# Patient Record
Sex: Male | Born: 1950
Health system: Southern US, Community
[De-identification: ages and names within clinical notes are randomized; demographics above are authoritative.]

## PROBLEM LIST (undated history)

## (undated) DIAGNOSIS — M51369 Other intervertebral disc degeneration, lumbar region without mention of lumbar back pain or lower extremity pain: Secondary | ICD-10-CM

## (undated) DIAGNOSIS — I10 Essential (primary) hypertension: Secondary | ICD-10-CM

## (undated) DIAGNOSIS — E782 Mixed hyperlipidemia: Secondary | ICD-10-CM

## (undated) DIAGNOSIS — I201 Angina pectoris with documented spasm: Secondary | ICD-10-CM

## (undated) DIAGNOSIS — R519 Headache, unspecified: Secondary | ICD-10-CM

## (undated) DIAGNOSIS — C3490 Malignant neoplasm of unspecified part of unspecified bronchus or lung: Secondary | ICD-10-CM

## (undated) DIAGNOSIS — R590 Localized enlarged lymph nodes: Secondary | ICD-10-CM

## (undated) DIAGNOSIS — M549 Dorsalgia, unspecified: Secondary | ICD-10-CM

## (undated) DIAGNOSIS — I2510 Atherosclerotic heart disease of native coronary artery without angina pectoris: Secondary | ICD-10-CM

## (undated) DIAGNOSIS — F419 Anxiety disorder, unspecified: Secondary | ICD-10-CM

## (undated) DIAGNOSIS — C801 Malignant (primary) neoplasm, unspecified: Secondary | ICD-10-CM

## (undated) DIAGNOSIS — N200 Calculus of kidney: Secondary | ICD-10-CM

## (undated) DIAGNOSIS — G894 Chronic pain syndrome: Secondary | ICD-10-CM

## (undated) DIAGNOSIS — G8929 Other chronic pain: Secondary | ICD-10-CM

## (undated) DIAGNOSIS — R600 Localized edema: Secondary | ICD-10-CM

## (undated) DIAGNOSIS — R7303 Prediabetes: Secondary | ICD-10-CM

## (undated) DIAGNOSIS — G43909 Migraine, unspecified, not intractable, without status migrainosus: Secondary | ICD-10-CM

## (undated) DIAGNOSIS — E785 Hyperlipidemia, unspecified: Secondary | ICD-10-CM

## (undated) DIAGNOSIS — Z77098 Contact with and (suspected) exposure to other hazardous, chiefly nonmedicinal, chemicals: Secondary | ICD-10-CM

## (undated) DIAGNOSIS — J449 Chronic obstructive pulmonary disease, unspecified: Secondary | ICD-10-CM

## (undated) DIAGNOSIS — Z87442 Personal history of urinary calculi: Secondary | ICD-10-CM

## (undated) DIAGNOSIS — E119 Type 2 diabetes mellitus without complications: Secondary | ICD-10-CM

## (undated) DIAGNOSIS — M1990 Unspecified osteoarthritis, unspecified site: Secondary | ICD-10-CM

## (undated) HISTORY — PX: TONSILLECTOMY: SUR1361

## (undated) HISTORY — PX: HEMORROIDECTOMY: SUR656

## (undated) HISTORY — PX: APPENDECTOMY: SHX54

---

## 1982-11-03 DIAGNOSIS — I2510 Atherosclerotic heart disease of native coronary artery without angina pectoris: Secondary | ICD-10-CM

## 1982-11-03 HISTORY — DX: Atherosclerotic heart disease of native coronary artery without angina pectoris: I25.10

## 1998-02-15 ENCOUNTER — Other Ambulatory Visit: Admission: RE | Admit: 1998-02-15 | Discharge: 1998-02-15 | Payer: Self-pay | Admitting: Cardiology

## 1998-07-29 ENCOUNTER — Inpatient Hospital Stay (HOSPITAL_COMMUNITY): Admission: EM | Admit: 1998-07-29 | Discharge: 1998-07-31 | Payer: Self-pay | Admitting: Emergency Medicine

## 1999-04-15 ENCOUNTER — Inpatient Hospital Stay (HOSPITAL_COMMUNITY): Admission: EM | Admit: 1999-04-15 | Discharge: 1999-04-18 | Payer: Self-pay | Admitting: Emergency Medicine

## 1999-04-15 ENCOUNTER — Encounter: Payer: Self-pay | Admitting: Cardiology

## 1999-04-17 ENCOUNTER — Encounter: Payer: Self-pay | Admitting: Cardiology

## 1999-11-15 ENCOUNTER — Ambulatory Visit (HOSPITAL_COMMUNITY): Admission: RE | Admit: 1999-11-15 | Discharge: 1999-11-15 | Payer: Self-pay | Admitting: Family Medicine

## 1999-11-15 ENCOUNTER — Encounter: Payer: Self-pay | Admitting: Family Medicine

## 1999-11-22 ENCOUNTER — Ambulatory Visit (HOSPITAL_COMMUNITY): Admission: RE | Admit: 1999-11-22 | Discharge: 1999-11-22 | Payer: Self-pay | Admitting: Family Medicine

## 1999-11-22 ENCOUNTER — Encounter: Payer: Self-pay | Admitting: Family Medicine

## 2000-07-21 ENCOUNTER — Ambulatory Visit (HOSPITAL_COMMUNITY): Admission: RE | Admit: 2000-07-21 | Discharge: 2000-07-21 | Payer: Self-pay | Admitting: Cardiology

## 2001-09-09 ENCOUNTER — Encounter: Admission: RE | Admit: 2001-09-09 | Discharge: 2001-12-08 | Payer: Self-pay

## 2001-12-30 ENCOUNTER — Encounter: Admission: RE | Admit: 2001-12-30 | Discharge: 2002-03-30 | Payer: Self-pay

## 2003-06-28 ENCOUNTER — Ambulatory Visit (HOSPITAL_COMMUNITY): Admission: RE | Admit: 2003-06-28 | Discharge: 2003-06-28 | Payer: Self-pay | Admitting: Family Medicine

## 2003-06-28 ENCOUNTER — Encounter: Payer: Self-pay | Admitting: Family Medicine

## 2005-08-07 ENCOUNTER — Ambulatory Visit (HOSPITAL_COMMUNITY): Admission: RE | Admit: 2005-08-07 | Discharge: 2005-08-07 | Payer: Self-pay | Admitting: Urology

## 2006-03-04 ENCOUNTER — Encounter: Payer: Self-pay | Admitting: Emergency Medicine

## 2006-10-11 ENCOUNTER — Emergency Department (HOSPITAL_COMMUNITY): Admission: EM | Admit: 2006-10-11 | Discharge: 2006-10-11 | Payer: Self-pay | Admitting: Emergency Medicine

## 2006-10-14 ENCOUNTER — Ambulatory Visit (HOSPITAL_BASED_OUTPATIENT_CLINIC_OR_DEPARTMENT_OTHER): Admission: RE | Admit: 2006-10-14 | Discharge: 2006-10-14 | Payer: Self-pay | Admitting: Urology

## 2006-10-19 ENCOUNTER — Ambulatory Visit (HOSPITAL_BASED_OUTPATIENT_CLINIC_OR_DEPARTMENT_OTHER): Admission: RE | Admit: 2006-10-19 | Discharge: 2006-10-19 | Payer: Self-pay | Admitting: Urology

## 2009-12-13 ENCOUNTER — Encounter: Admission: RE | Admit: 2009-12-13 | Discharge: 2009-12-13 | Payer: Self-pay | Admitting: Cardiology

## 2011-03-21 NOTE — Op Note (Signed)
NAME:  Francisco Austin, Francisco Austin NO.:  [id number]   MEDICAL RECORD NO.:  [id number]          PATIENT TYPE:  AMB   LOCATION:  [facility]                         FACILITY:  [facility]   PHYSICIAN:  Maretta Bees. Vonita Moss, M.D.DATE OF BIRTH:  03/21/1951   DATE OF PROCEDURE:  10/14/2006  DATE OF DISCHARGE:                               OPERATIVE REPORT   PREOPERATIVE DIAGNOSIS:  Right ureteral stone at level of S1.   POSTOPERATIVE DIAGNOSIS:  Right ureteral stone at level of S1 plus right  ureteral stricture.Marland Kitchen   PROCEDURE:  Cystoscopy, right retrograde pyelogram with interpretation,  right ureteroscopy, dilation of right ureteral stricture and the  insertion of right double-J.   SURGEON:  Dr. Larey Dresser   ANESTHESIA:  General.   INDICATIONS:  This 60 year old gentleman has had right flank pain due to  a 3 x 5 mm stone at the level of S1 on the right ureter.  He has had  hydronephrosis.  He has had pain off and on for several weeks.  Because  of the lack of passage of the stone, he was brought to the OR today for  further evaluation and treatment.   PROCEDURE:  The patient brought to the operating room, placed in  lithotomy position.  The external genitalia were prepped and draped in  usual fashion.  He was cystoscoped and the bladder was unremarkable.  A  guidewire was placed up the right ureter past the stone that was faintly  seen at the level S1.  With the guidewire in place, I dilated the  intramural ureter with the inner aspect of a ureteral access sheath.  I  then inserted the short 6-French rigid ureteroscope and could get up to  the level of the iliac vessels just below the stone where there was a  narrowed segment of ureter.  I tried to dilate that area with the  ureteral access sheath but it did not seem to pass satisfactorily so I  used the balloon dilator for three minutes.  I then reinserted the rigid  scope and still could not negotiate beyond this tight area in the  ureter.  I then tried to use the flexible scope but there was a lot of  ureteral spasm and I could not get up to the stone.  At this point I  felt it was best to complete the procedure by performing a right  retrograde pyelogram.   Utilizing the cystoscopically inserted guidewire and open-ended ureteral  catheter I performed direct right retrograde pyelogram which showed  pyelocaliectasis.  There was no evidence of extravasation.   The guidewire was reinserted and back loaded through the cystoscope and  over the cystoscope I inserted a 6-French multi coil Kwart ureteral  catheter which coiled nicely in the renal pelvis with a full coil in the  bladder.  He was then  taken to recovery room in good condition.  Plan is to see him in a  couple of days and see if I can see the stone visibly on x-ray and  decide upon ESL or going back in a week ureteroscopically  which usually  is successful after the ureter has been put rest with a double-J  catheter over that interval of time.      Maretta Bees. Vonita Moss, M.D.  Electronically Signed     LJP/MEDQ  D:  10/14/2006  T:  10/14/2006  Job:  [id number]

## 2011-03-21 NOTE — Consult Note (Signed)
[facility]  Patient:    VARDAAN, Francisco Austin Visit Number: [id number] MRN: [id number]          Service Type: PMG Location: [facility] Attending Physician:  Rolly Salter Dictated by:   Sondra Come, D.O. Proc. Date: 11/01/01 Admit Date:  09/09/2001   CC:         Meredith Staggers, M.D.   Consultation Report  HISTORY:  The patient presents to the clinic today for evaluation.  I received a call from the patient on October 30, 2001 as the on-call Therisa Mennella over the weekend.  The patient apparently was started on Neurontin on October 19, 2001, and states that, since that time, he has had progressive numbness and paresthesias in his hands and legs.  He also notes increased nervousness causing his chest discomfort to worsen.  His chest discomfort that he described was typical for him.  He has a history of angina, for which he takes nitroglycerin.  He stated that he had to increase his nitroglycerin dose.  He also had requested to increase his use of Lorcet secondary to increased pain caused by the paresthesias.  We discussed this at length and I instructed him to decrease his Neurontin to one pill on December 29 and then discontinue. He was instructed to go to the emergency department if his chest pain symptoms increased.  He comes in the clinic on Monday morning for an evaluation.  He did not require to go to the emergency department.  He states that his symptoms are slowly resolving.  Currently, he states his left hand and left lower extremity are back to normal, but he still has some paresthesias in his right hand and foot.  Since he increased his Lorcet plus to q.i.d., he has run out four day early.  He has also run out of his lorazepam secondary to increasing that, as well.  His anxiety and headaches remain increased, although he states are improving.  I reviewed the health and history form and 14-point review of systems.  PHYSICAL EXAMINATION:  GENERAL:   Healthy male in no acute distress.  VITAL SIGNS:  Blood pressure 108/70, pulse 95, respirations 20, O2 saturations 98%.  NEUROLOGIC:  Manual muscle testing 5/5 in bilateral upper and lower extremities.  Sensory exam is intact to light touch in bilateral upper and lower extremities.  Muscle stretch reflexes 2+/4 in bilateral upper and lower extremities.  Negative upper motor neuron signs.  IMPRESSION: 1. Chronic cephalgia. 2. Anxiety. 3. History of cardiovascular disease. 4. Transient paresthesias secondary to Neurontin.  PLAN: 1. Discontinue Neurontin. 2. Continue Lorcet 7.5 mg, one p.o. t.i.d. as previously written, #90 with no    refills. 3. Continue lorazepam 1 mg, one p.o. t.i.d. as needed, #90 without refills. 4. Patient to return to the clinic in one month for reevaluation.  The patient was educated in the above findings and recommendations and understands.  No barriers to communication. Dictated by:   Sondra Come, D.O. Attending Physician:  Rolly Salter DD:  11/02/01 TD:  11/02/01 Job: [id number]
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
## 2011-03-21 NOTE — Consult Note (Signed)
[facility]  Patient:    Francisco Austin, Francisco Austin Visit Number: [id number] MRN: [id number]          Service Type: PMG Location: [facility] Attending Physician:  Sondra Come Dictated by:   Sondra Come, D.O. Admit Date:  12/30/2001   CC:         Meredith Staggers, M.D.   Consultation Report  HISTORY OF PRESENT ILLNESS:  The patient returns to the clinic today for reevaluation. He continues to have daily headaches but seems to be well controlled with the combination of hydrocodone 10 mg 3-4 times daily, Ativan 1 mg 3 times daily for anxiety, and nitroglycerin. He is not interested in any type of interventional procedures, diagnostically or therapeutically, stating that he has had multiple shots all over his body which never worked. I reviewed health and history form and 14-point review of systems. The patients his pain is a 6/10 on a subjective scale. His function and quality of life indices remains stable. His sleep is fair with Ativan. He has tried multiple other medications in the past including Topamax and Neurontin which he was not able to tolerate. He has been on SSRI antidepressants. He does not bring his pill bottle with him but seems to be taking these appropriately based on the date of his last prescription.  PHYSICAL EXAMINATION:  GENERAL:  A healthy male in no acute distress.  VITAL SIGNS:  Blood pressure 118/78, pulse 82, respirations 14, O2 saturation 99% on room air.  NEUROLOGIC:  Cranial nerves are grossly intact. Manual muscle testing is 5/5 bilateral upper extremities. Sensory examination is intact to light touch bilateral upper extremities. Muscle stretch reflexes are 2+/4 bilateral biceps, triceps, brachioradialis, and pronator teres. No atrophy in the upper extremities bilaterally.  IMPRESSION: 1. Headache, cervicogenic. 2. Cervicalgia. 3. Anxiety.  PLAN: 1. Continue hydrocodone 10 mg/325 1 p.o. t.i.d. to q.i.d. as needed    #100  without refills. 2. Continue Ativan 1 mg 1 p.o. t.i.d. as needed for anxiety, #90    without refills. 3. The patient is to return to clinic in 1 month for reevaluation.  The patient was educated on the above findings and recommendations and understands. There were no barriers to communication. Dictated by:   Sondra Come, D.O. Attending Physician:  Sondra Come DD:  12/31/01 TD:  12/31/01 Job: [id number]

## 2011-03-21 NOTE — Op Note (Signed)
NAME:  Francisco Austin, Francisco Austin NO.:  [id number]   MEDICAL RECORD NO.:  [id number]          PATIENT TYPE:  AMB   LOCATION:  [facility]                         FACILITY:  [facility]   PHYSICIAN:  Maretta Bees. Vonita Moss, M.D.DATE OF BIRTH:  Nov 29, 1950   DATE OF PROCEDURE:  10/19/2006  DATE OF DISCHARGE:                               OPERATIVE REPORT   PREOPERATIVE DIAGNOSIS:  Right ureteral stone and suspected right  ureteral stricture.   POSTOPERATIVE DIAGNOSIS:  Right ureteral stone and suspected right  ureteral stricture.   PROCEDURE:  1. Cystoscopy.  2. Right ureteroscopy.  3. Right retrograde pyelogram with interpretation.  4. Right double-J catheter placement.   SURGEON:  Dr. Larey Dresser   ANESTHESIA:  General.   INDICATIONS:  This 60 year old gentleman has had a right upper ureteral  stone treated with lithotripsy in 2006 and the stone, as best we can  tell, has never passed, and he has had some recurrent bouts of right  flank pain.  Recent CT scan showed a 3 x 5 mm stone at the level of S1  and because of a long history of recurrent pain, we went ahead with  intervention on October 14, 2006, at which time cystoscopy and  ureteroscopy was done, but he had a very tight, narrowed ureter just  below the stone, over the iliac vessel area, and the stone was located  at the level of S1.  At that time, I could see the stone but could not  get to it and ended up putting in the right double-J catheter which he  was not tolerated very well.  He is brought to the OR today for a second  look procedure.   PROCEDURE:  The patient was brought to the operating room, placed in  lithotomy position.  External genitalia were prepped, draped in the  usual fashion.  He was cystoscoped, and the anterior urethra and  prostate were remarkable for high-riding bladder neck.  I identified the  double-J catheter in the bladder and pulled it out per urethra and then  injected a Sensor guidewire.   I could see the stone adjacent to the  guidewire at the level of S1.  I then inserted a 6-French rigid  ureteroscope and was able to negotiate it easily to about 1 inch from  the stone on fluoroscopic view.  At this time, I still could not advance  the scope, either due to spasm or true stricture.  I then inserted a  ureteral access sheath.  It seemed to go easily right up below the stone  and then inserted ultra flexible ureteroscope and still could not  advance this to the stone.  I am not sure whether he has a very severe  spasm or true stricture or inflammation in this area from the  longstanding stone.  I injected contrast through a cone-tip catheter,  and there was some extravasation at the narrowed strictured area.  I  felt this time that I did not want to risk any ureteral injury by  aggressively trying to get up to the stone.  With an  open-ended ureteral  catheter placed over the guidewire, I then checked the contrast, and he  had pyelocaliectasis and with the guidewire in place, I reinserted the  Kwart multi-coil catheter with full coils in the renal pelvis and a full  coil in the bladder.  I then injected contrast alongside the Kwart  catheter with a cone tip injection catheter at the ureteral orifice, and  the lower ureter was intact, and contrast went up alongside the double-J  and entered the upper ureter which was intact above the area where the  stone is located.  Reluctantly, I felt it best to just leave a double-J  catheter in place and the bladder was emptied and cystoscope utilized to  insert the double-J catheter was removed and the patient sent to the  recovery room in good condition.      Maretta Bees. Vonita Moss, M.D.  Electronically Signed     LJP/MEDQ  D:  10/19/2006  T:  10/19/2006  Job:  [id number]

## 2011-03-21 NOTE — Consult Note (Signed)
[facility]  Patient:    Francisco Austin Visit Number: [id number] MRN: [id number]          Service Type: PMG Location: [facility] Attending Physician:  Rolly Salter Dictated by:   Jewel Baize. Stevphen Rochester, M.D. Proc. Date: 11/30/01 Admit Date:  09/09/2001                            Consultation Report  REASON FOR FOLLOWUP:  Francisco Austin [facility] for continued assessment and ongoing management.  1. He does not request any interventional procedures, in fact, would like to    avoid interventional procedures, and continue with his current medication    treatment profile.  He has had less frequency, intensity and duration of    headaches, he is using his medications sparingly and appropriately, states    no wish to harm self or others and is compliant to patient care agreement. 2. We do not believe further escalation in narcotic-based pain medication is    warranted, and reviewed this with him. 3. Lifestyle enhancements discussed. 4. Risks of these medications reviewed such as operating machinery, making    decisions and judgment impairment as possibilities, and he accepts.  OBJECTIVE:  Objectively, no significant change in neurological or musculoskeletal presentation.  He has diffuse paracervical myofascial discomfort, impaired flexion and extension and lateral rotational pain, positive cervical facetal compression test, left greater than right.  He has no new neurological features, motor, sensory or reflexive.  IMPRESSION: 1. Cervicalgia. 2. Degenerative spinal disease, cervical spine.  PLAN:  Conservative management.  Follow up in one month.  Should he escalate, consider cervical facet injection and possible third occipital nerve injection.  I do believe his headaches are cervicogenic in nature by a good degree.  Reviewed with him.  Extensive discussion. Dictated by:   Jewel Baize Stevphen Rochester, M.D. Attending Physician:  Rolly Salter DD:  11/30/01 TD:  12/01/01 Job: [id number]

## 2011-03-21 NOTE — Consult Note (Signed)
[facility]  Patient:    Francisco Austin, Francisco Austin Visit Number: [id number] MRN: [id number]          Service Type: PMG Location: [facility] Attending Physician:  Rolly Salter Dictated by:   Jewel Baize. Stevphen Rochester, M.D. Admit Date:  09/09/2001                            Consultation Report  Francisco Austin [facility].  I review health and history form, 14 point review of systems.  [name] is doing fairly well on his current medication regimen.  He is able to work, has adequate functional indexes, but occasionally breaks through from headache perspective.  I think he is demonstrating evidence of not only a cervicogenic headache, but also elements that he does have a persistency to cephalagia.  I think our directed care approach forward would be to consider a C2 nerve root block or even something along the lines of occipital nerve block but he asked [place] to delay this at this time.  I think this is reasonable as he does have financial stressors and he has not met his deductible.  He will probably be meeting his deductible fairly soon but he understands I will go ahead and follow his wishes but I do not want to escalate on narcotic based pain medication.  He brings his pill bottles in.  He is appropriate to count.  Lifestyle enhancements such as cigarette cessation are discussed and I review other elements in his historical features that are modifiable to improve his frequency, intensity, and duration of headaches.  They have really been quite stable.  I do not believe we need further diagnostics or intervention beyond a conservative management today but to make any movement forward as to frequency, intensity, and duration, I think we will probably need to consider further interventional therapy.  OBJECTIVE:  BACK:  He has diffuse paracervical myofascial discomfort, impaired flexion/extension, ______ rotational pain, positive cervical  facetal compression test, left greater than right.  NEUROLOGIC:  No new neurological features, motor, sensory, reflexes.  IMPRESSION:  Cervicalgia, degenerative spinal disease cervical spine, cervicogenic headaches, poor overall health characteristics.  PLAN:  As outlined above.  Add Neurontin for restorative sleep capacity advancing to b.i.d.  He understands operating machinery and risk of these medications and we caution.  He agrees to the cautious and to avoid medications when driving as possible.  Extensive consultation. Dictated by:   Jewel Baize Stevphen Rochester, M.D. Attending Physician:  Rolly Salter DD:  10/19/01 TD:  10/19/01 Job: [id number]

## 2011-03-21 NOTE — Cardiovascular Report (Signed)
[place]. [facility]  Patient:    Francisco Austin, Francisco Austin                       MRN: [id number] Proc. Date: 07/21/00 Adm. Date:  [id number] Attending:  Norman Clay CC:         Meredith Staggers, M.D.   Cardiac Catheterization  HISTORY:  This 60 year old male has had recurrent chest discomfort and had an angioplasty of the diagonal branch many years ago.  He has had persistent ongoing chest discomfort previously.  He is taking up to 100 nitroglycerin per week at the previous time.  Previous noninvasive studies have not shown any ischemia.  DESCRIPTION OF PROCEDURE:  Cardiac catheterization was done using 6 French catheters, 6 French sheath in the right femoral artery percutaneously.  A 30 degree ventriculogram was performed.  The patient had chest pain throughout the procedure.  Chest pain would intensify during coronary injections but was present even without coronary injection.  A decision was made to do a fractional flow reserve because of a question raised in the pass significant of right coronary artery disease.  The transducers were calibrated and a 6 [place] JR4 guide was used.  Heparin 3000 units was given intravenously.  The pressure wire was zeroed and then advanced beyond the lesion.  Fractional flow reserves were done using 18, 24, and 30 mcg of adenosine.  The lowest fractional flow reserve was 0.85 that was noted with the median being 0.87. Following the procedure an ACT was 177. The sheath was removed and he was returned back to the angioplasty care center in stable condition.  HEMODYNAMIC DATA:  Aorta post contrast 110/57, LV post contrast 110/10.  LEFT VENTRICULOGRAM:  The left ventriculogram performed in the 30 degree RAO projection.  The aortic valve was normal.  The mitral valve was normal.  The left ventricle appears normal in size.  The estimated ejection fraction is 60%.  Coronary arteries arise and distribute normally.  There  is mild calcification noted in the proximal coronary system.  The left main coronary artery appeared angiographically normal.  The left anterior descending has mild luminal irregularities proximally.  The first diagonal branch from the previous angioplasty several years ago has less than 40% residual narrowing. It appears widely patent.  A small second diagonal branch may have a stenosis at its origin.  This is to small for angioplasty.  The midportion of the vessel appears to be diffusely narrowed at approximately 40%.  There is not felt to be significant obstructive disease in the LAD.  The circumflex coronary artery has a single large marginal artery and a small intermediate branch.  The marginal artery has 20-30% irregularity noted.  The continuation branch of the circumflex contains a very small second marginal branch.  The right coronary artery is widely patent and in the midportion is estimated 40% stenosis which is somewhat eccentric.  This is looked at in multiple projections.  The stenosis was not functionally significant by fractional flow of reserve.  IMPRESSION: 1. Mild to moderate coronary artery disease involving the left anterior    descending, right coronary artery and circumflex without significant    reduction in fractional flow reserve in the right coronary artery. 2. Normal left ventricular function.  RECOMMENDATIONS:  Consider alternative sources of pain.  Consider GI work-up or pain clinic. DD:  07/21/00 TD:  07/21/00 Job: [id number]

## 2011-03-21 NOTE — Consult Note (Signed)
[facility]  Patient:    Francisco Austin, Francisco Austin Visit Number: [id number] MRN: [id number]          Service Type: PMG Location: [facility] Attending Physician:  Rolly Salter Dictated by:   Jewel Baize. Stevphen Rochester, M.D. Admit Date:  09/09/2001   CC:         Dr. Dayton Scrape   Consultation Report  Francisco [facility] as a kind referral from Dr. Dayton Scrape  Dear Dr. Dayton Scrape:  Thank you for sending Francisco Austin to [place].  The following history and physical entails our consultation which we are appreciative.  We would be happy to control his medications and I had him sign a patient care agreement in this regard.  If you have any questions, please do not hesitate to call.  Francisco Austin is a 60 year old male who is complaining of chronic pain, chest pains, cluster headaches with an extensive history of evaluations at multiple pain clinics, interdisciplinary, as well as primary care.  There is some question whether he has hepatitis C as his wife has exposure and his myalgias are consistent with a progressive myofascial pain syndrome.  This is most progressive in the paracervical region and he relates these to his headaches. He has no specific aura.  He has no obvious triggering event, but states he has one to two of these a week and he states they have been described as cluster.  They do not have the typical characteristics of cluster as he describes more of a tension migraine.  He has been evaluated by neurology.  He relates no progressive neurological deficit.  His chest pain has been evaluated from a cardiac standpoint and felt to be atypical in nature.  He is stating they are fairly stable at presentation with pain primarily in the anterior thorax, but sometimes internal to the abdominal region with pain described as dull, achy, and throbbing, 7/10 on a subjective scale.  He also states that the pain is constant, burning, stabbing and he has trialed  protein pump inhibitors, etcetera with modest success.  He has only had success with narcotic based pain medication which he has been taking a number of years.  A note to the chart relates that he has seen a psychologist which has recommended detoxification but the patient comes in today with his pill bottles appropriate to count and states that he only takes it at a p.r.n. level and does quite well with basic hydrocodone.  He has abandoned MS Contin and other pharmacokinetically long-acting drugs secondary to undesirable side effects.  His pain is made worse with working, but continues to work.  He is a Francisco Austin by trade.  He drives quite a bit and I caution as to use of medications but he has done well with these medications and driving.  He is improved with medications.  He has had multiple tests including MRI, CT, and x-rays.  Will try to obtain some of these results.  I reviewed the notes attached to chart from Dr. Dayton Scrape and appreciate.  MEDICATIONS:  Nitrostat which is marginally effective, morphine sulfate which he has discontinued, Lipitor recently initiated, lorazepam, Lorcet Plus.  ALLERGIES:  STEROID which upset his stomach.  He has had multiple shots of unclear etiology, sounds like Demerol.  He states they do not help him.  A 14 point review of systems, health and history form reviewed.  PAST SURGICAL HISTORY:  Heart catheterization with PTCA in 1997.  PAST MEDICAL  HISTORY:  He states he does have an underlying cardiovascular disease, but states it is stable.  FAMILY HISTORY:  Heart disease and cancer.  SOCIAL HISTORY:  He is a pipe smoker.  Continued working as noted above.  He does not drink alcohol.  He is married.  REVIEW OF SYSTEMS:  Otherwise noncontributory to the pain problems.  States no wish to harm himself or others.  PHYSICAL EXAMINATION  GENERAL:  Pleasant male sitting comfortably in bed.  Gait, affect, appearance are normal.  Oriented x  3.  HEENT:  Unremarkable.  CHEST:  Clear to auscultation and percussion.  CARDIAC:  Regular rate and rhythm without rub, murmur, gallop.  ABDOMEN:  Soft, nontender, benign.  No hepatosplenomegaly.  He has some upper epigastric discomfort on deep palpation.  BACK:  He has diffuse suprascapular, paracervical, paralumbar myofascial discomfort, pain over occiput with suboccipital compression test positive. Spurlings negative.  Positive facetal compression test right greater than left.  NEUROLOGIC:  He has no other overt neurologic deficit, motor, sensory, reflexic.  IMPRESSION:  Cervicalgia, degenerative spinal disease, cervical lumbar spine, contributory myofascial pain syndrome, chronic daily headaches (doubt cluster), poor overall health characteristics, cardiovascular disease.  PLAN: 1. He requests conservative management which we will initiate.  Topamax will    be slowly advanced as I think an excellent adjunctive with minimal side    effects, but do describe these due to the nature of the patients work    related activities. 2. Instructed to maintain contact with primary care. 3. Will go ahead and continue him on Lorcet.  He will sign patient care    agreement with [place] and this seems to be holding his pain which is fine as he    uses it sparingly and appropriately, although risks are discussed. 4. Obtain LFT and GGT to assess acetaminophen toxicity and there is some    remote history of hepatitis C somewhere in the family and I am going to go    ahead and just check a panel on him. 5. As a routine part of our medication control strategy and with full informed    consent, the patient agrees to medical drug screen urine today. 6. Will see him in follow-up in a couple of weeks, make sure we are making    some progress.  Consider adding ______ or possibly cranial sacral    manipulation.  I discussed other strategies and management technologies     that might be useful for him  but I think we are going to need to establish    a patient/physician relationship as he is somewhat leery of these at this    time.  Extensive consultation Dictated by:   Jewel Baize. Stevphen Rochester, M.D. Attending Physician:  Rolly Salter DD:  09/14/01 TD:  09/14/01 Job: [id number]

## 2011-03-21 NOTE — Consult Note (Signed)
[facility]  Patient:    Francisco Austin, Francisco Austin Visit Number: [id number] MRN: [id number]          Service Type: PMG Location: [facility] Attending Physician:  Rolly Salter Dictated by:   Sondra Come, D.O. Proc. Date: 09/24/01 Admit Date:  09/09/2001                            Consultation Report  HISTORY OF PRESENT ILLNESS:  [name] returns to the clinic today as scheduled for follow-up.  He was seen initially on September 14, 2001, by Jewel Baize. Stevphen Rochester, M.D., [facility].  Overall today the patient states that he is somewhat improved.  He continues to have intermittent episodes of chest pain which he attributes to spasm of his coronary arteries.  He continues to have occasional cluster headache/migraine-type headache, which he says is improved with the addition of Topamax 25 mg b.i.d.  I review his history per Dr. Lisabeth Register C. Hansens note. It seems that the patient has had multiple interventions for his headaches, including multiple injection-type therapies without any relief.  He has also been followed in the past by various pain clinics and has had psychological assessment.  Currently he continues to take Lorcet Plus approximately three per day and has been using this appropriately without any abuse.  He also continues to take Ativan for anxiety and nitroglycerin for chest pain, five sublingual per day.  According to [employer]. Stevphen Rochester, M.D., he feels that it is appropriate to continue Lorcet as the patient is taking this medication sparingly.  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  The patient denies any interference with his ability to operate a motor vehicle or perform his job.  The patient does not bring his pill bottles to the clinic with him today, but states he has them in his car and did not realize he needed to bring them to the clinic.  He  states that he has approximately 10 pills left from a prescription written by Meredith Staggers, M.D.  it was noted two weeks ago that he had appropriate pill counts.  Since the last visit, it has also had laboratory work-up to include liver function tests, GGT, and a hepatitis panel, which were negative.  He also had urine drug screen which was positive only for opiates, which would be consistent with his use of Lorcet Plus.  Currently the patients pain is a 6/10 on a subjective scale and located mainly in his right temporal area and his left chest at the lower border of the ribs.  I review health and history form and 14-point review of systems.  PHYSICAL EXAMINATION:  A healthy-appearing male in no acute distress.  VITAL SIGNS:  The blood pressure is 109/68, pulse 95, respirations 16, and O2 saturation 96% on room air.  HEART:  Regular rate and rhythm without murmurs, rubs, or gallops.  LUNGS:  Clear to auscultation bilaterally.  MUSCULOSKELETAL:  Palpatory examination reveals no tenderness to palpation over the cervical, paraspinals, or parascapular muscles.  There is no tenderness over the suboccipital muscles or over the greater occipital nerves. There is no tenderness to palpation over the anterior chest wall or at the inferior rib margin at the diaphragm.  Manual muscle testing is 5/5 in bilateral upper extremities.  The sensory exam is intact to light touch in  bilateral upper extremities.  Muscle stretch reflexes are 2+/4 in bilateral upper extremities.  IMPRESSION: 1. Chronic daily headaches improved with Topamax. 2. Cardiovascular disease. 3. No clinical evidence today of myofascial pain syndrome given the absence of    muscle tenderness or trigger points in the above-noted areas.  PLAN: 1. I had a discussion with the patient regarding medication management.  It is    reasonable to continue Lorcet Plus as he is taking now up to three times    daily.  This can be  managed either by his primary care Welford Christmas or by this    clinic. 2. Continue Topamax 25 mg b.i.d. 3. Continue anxiolytic. 4. The patient is to follow up with Lisabeth Register C. Stevphen Rochester, M.D., on an as needed    basis.  The patient is instructed to call our clinic if he wishes to    return.  He does express some financial concerns, stating that he has to    pay for his own visits.  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  The patient    was instructed to discuss with his primary care Shavar Gorka. 5. The patient was educated on the above findings and recommendations and    understands.  There were no barriers to communication. Dictated by:   Sondra Come, D.O. Attending Physician:  Rolly Salter DD:  09/24/01 TD:  09/24/01 Job: [id number]

## 2014-07-18 ENCOUNTER — Other Ambulatory Visit: Payer: Self-pay | Admitting: Family Medicine

## 2014-07-18 ENCOUNTER — Ambulatory Visit
Admission: RE | Admit: 2014-07-18 | Discharge: 2014-07-18 | Disposition: A | Discharge status: Home / Self Care | Payer: BC Managed Care – PPO | Source: Ambulatory Visit | Attending: Family Medicine | Admitting: Family Medicine

## 2014-07-18 DIAGNOSIS — M79671 Pain in right foot: Secondary | ICD-10-CM

## 2016-09-24 ENCOUNTER — Other Ambulatory Visit: Payer: Self-pay | Admitting: Family Medicine

## 2016-09-24 DIAGNOSIS — Z136 Encounter for screening for cardiovascular disorders: Secondary | ICD-10-CM

## 2016-10-06 ENCOUNTER — Ambulatory Visit
Admission: RE | Admit: 2016-10-06 | Discharge: 2016-10-06 | Disposition: A | Discharge status: Home / Self Care | Payer: BC Managed Care – PPO | Source: Ambulatory Visit | Attending: Family Medicine | Admitting: Family Medicine

## 2016-10-06 DIAGNOSIS — Z136 Encounter for screening for cardiovascular disorders: Secondary | ICD-10-CM

## 2016-10-06 IMAGING — US US AORTA SCREENING (MEDICARE)
1 series · 7 of 7 positions shown · non-contrast
Comparison: CT [DATE]

CLINICAL DATA: Patient with family history of AAA. , hypertension,
previous tobacco abuse

EXAM:
US ABDOMINAL AORTA MEDICARE SCREENING
TECHNIQUE: Ultrasound examination of the abdominal aorta was performed as a
screening evaluation for abdominal aortic aneurysm.

[Series 1: us aorta screening (medicare) · 0.34mm/px · 7 of 7 slices shown]
[im 1/7]
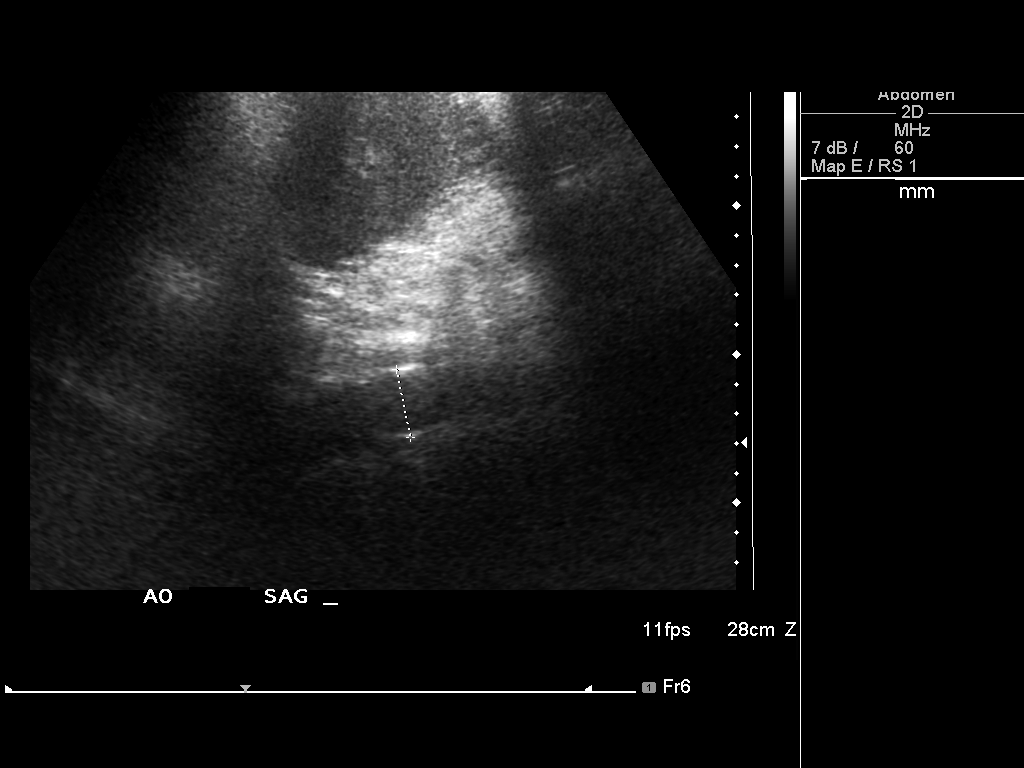
[im 2/7]
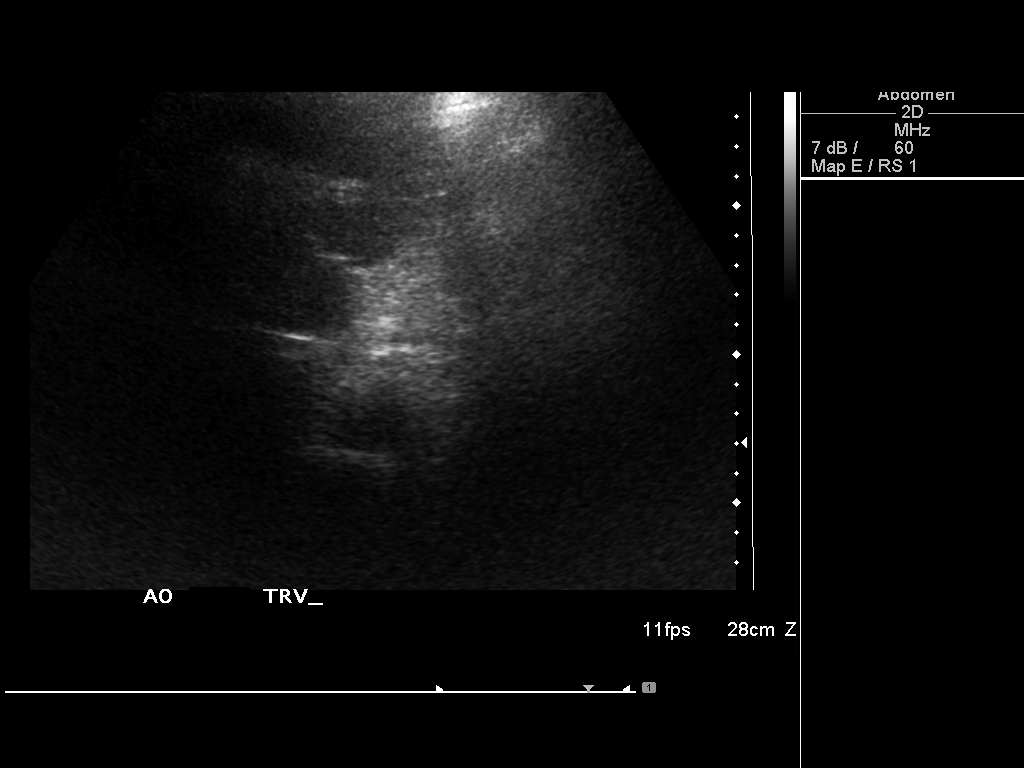
[im 3/7]
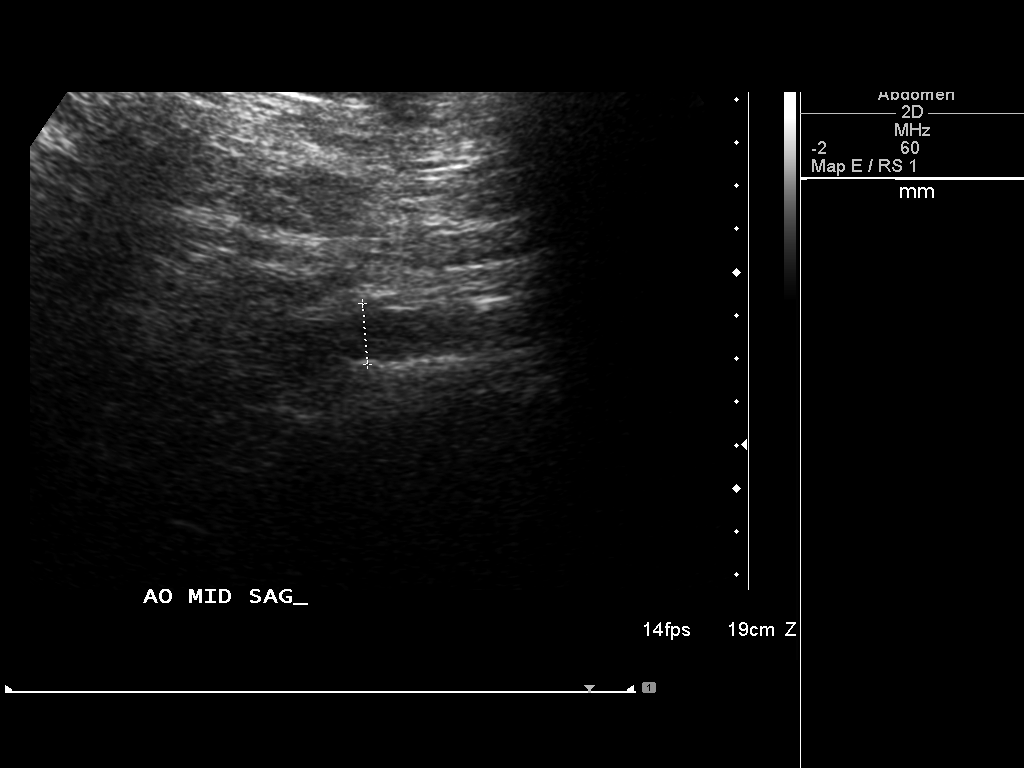
[im 4/7]
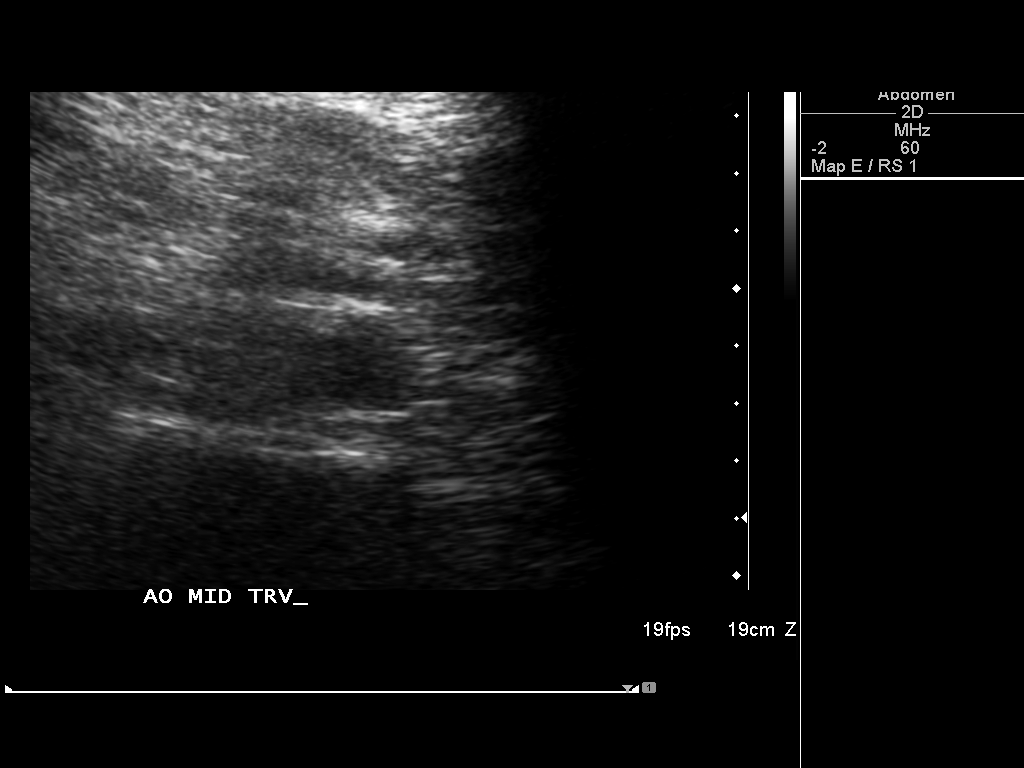
[im 5/7]
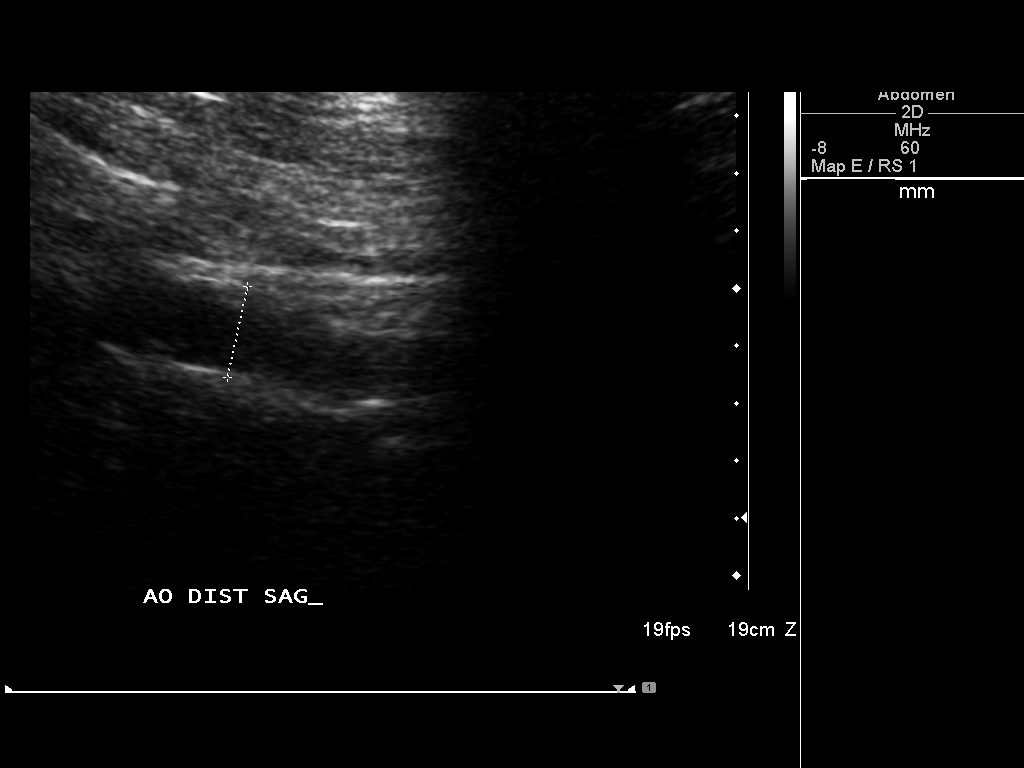
[im 6/7]
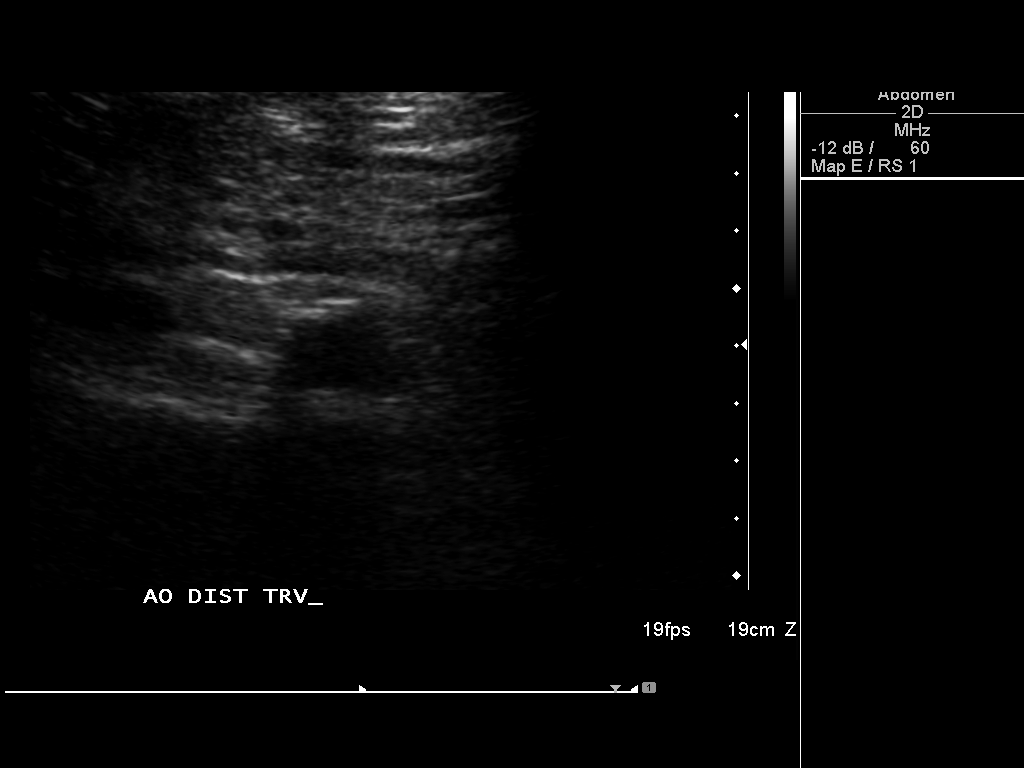
[im 7/7]
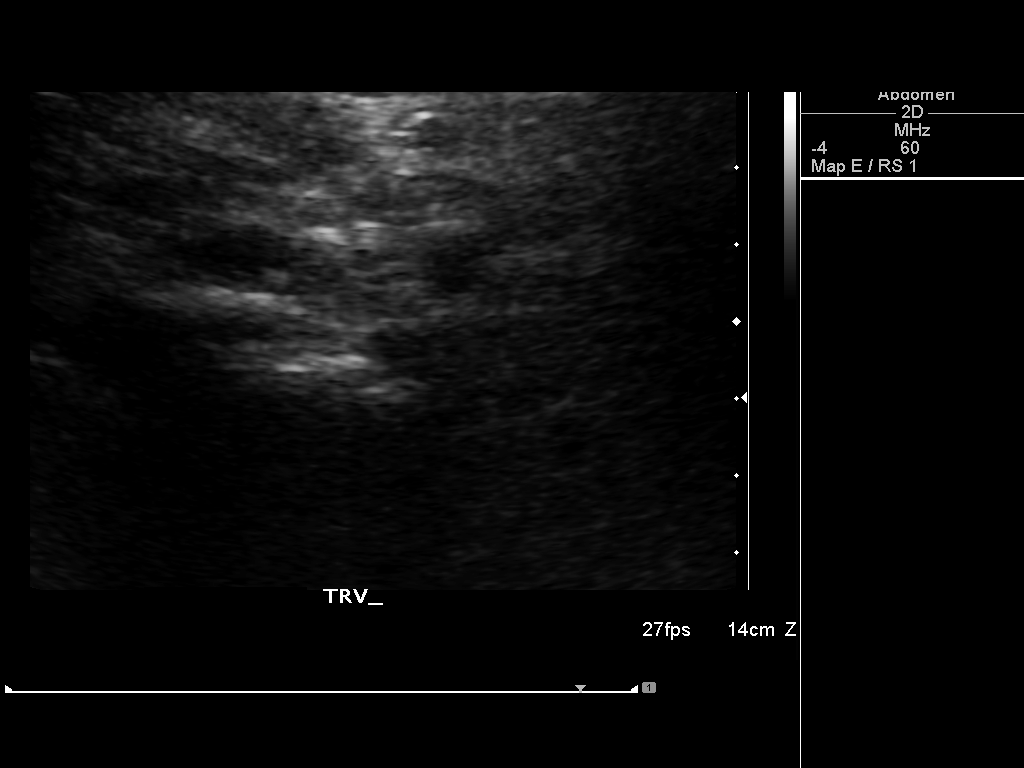

[7 of 7 positions shown; findings below may reference images not displayed]

FINDINGS: No aneurysm. Maximum Diameter: 2.3 cm. Scattered atheromatous
plaque.

ABDOMINAL AORTA

Proximal:  2.3 cm

Mid:  1.4 cm

Distal:  1.6 cm
IMPRESSION: 1. Negative for abdominal aortic aneurysm
2.  Aortic Atherosclerosis ([FT]-170.0)

## 2018-01-28 ENCOUNTER — Ambulatory Visit
Admission: RE | Admit: 2018-01-28 | Discharge: 2018-01-28 | Disposition: A | Discharge status: Home / Self Care | Payer: Medicare PPO | Source: Ambulatory Visit | Attending: Family Medicine | Admitting: Family Medicine

## 2018-01-28 ENCOUNTER — Other Ambulatory Visit: Payer: Self-pay | Admitting: Family Medicine

## 2018-01-28 DIAGNOSIS — M545 Low back pain: Secondary | ICD-10-CM

## 2018-02-23 DIAGNOSIS — M549 Dorsalgia, unspecified: Secondary | ICD-10-CM | POA: Insufficient documentation

## 2018-02-23 DIAGNOSIS — G8929 Other chronic pain: Secondary | ICD-10-CM | POA: Insufficient documentation

## 2018-04-22 DIAGNOSIS — M5136 Other intervertebral disc degeneration, lumbar region: Secondary | ICD-10-CM | POA: Insufficient documentation

## 2018-04-22 DIAGNOSIS — G8929 Other chronic pain: Secondary | ICD-10-CM | POA: Insufficient documentation

## 2018-04-22 DIAGNOSIS — M545 Low back pain, unspecified: Secondary | ICD-10-CM | POA: Insufficient documentation

## 2018-11-22 DIAGNOSIS — M5416 Radiculopathy, lumbar region: Secondary | ICD-10-CM | POA: Insufficient documentation

## 2019-04-28 ENCOUNTER — Ambulatory Visit (INDEPENDENT_AMBULATORY_CARE_PROVIDER_SITE_OTHER): Payer: BC Managed Care – PPO | Admitting: Podiatry

## 2019-04-28 ENCOUNTER — Encounter: Payer: Self-pay | Admitting: Podiatry

## 2019-04-28 ENCOUNTER — Other Ambulatory Visit: Payer: Self-pay

## 2019-04-28 VITALS — BP 109/76 | HR 88 | Temp 98.2°F | Resp 16

## 2019-04-28 DIAGNOSIS — L600 Ingrowing nail: Secondary | ICD-10-CM

## 2019-04-28 MED ORDER — NEOMYCIN-POLYMYXIN-HC 3.5-10000-1 OT SOLN: OTIC | 1 refills | Status: DC

## 2019-04-28 NOTE — Progress Notes (Signed)
   Subjective:    Patient ID: Francisco Austin, male    DOB: [birth year], 68 y.o.   MRN: [id number]  HPI    Review of Systems  Cardiovascular: Positive for chest pain.  All other systems reviewed and are negative.      Objective:   Physical Exam        Assessment & Plan:

## 2019-04-28 NOTE — Patient Instructions (Signed)

## 2019-05-01 NOTE — Progress Notes (Signed)
Subjective:   Patient ID: Francisco Austin, male   DOB: 68 y.o.   MRN: [id number]   HPI Patient presents stating his had a chronic ingrown toenail of his right big toe and it is made it difficult for him to be active or wear shoes and he is tried some soaks and other modalities without relief of symptoms.  Patient smokes a pipe does not smoke cigarettes and is active   Review of Systems  All other systems reviewed and are negative.       Objective:  Physical Exam Vitals signs and nursing note reviewed.  Constitutional:      Appearance: He is well-developed.  Pulmonary:     Effort: Pulmonary effort is normal.  Musculoskeletal: Normal range of motion.  Skin:    General: Skin is warm.  Neurological:     Mental Status: He is alert.     Neurovascular status intact muscle strength found to be adequate range of motion within normal limits.  Patient is found to have incurvated right hallux lateral border that is very painful when pressed and makes walking and shoe gear difficult.  Patient is tried wider shoes treatment options and does not have redness or drainage associated with this     Assessment:  Chronic ingrown toenail deformity right hallux lateral border with pain     Plan:  H&P condition reviewed and recommended correction of deformity explained procedure risk.  Patient wants surgery understanding risk and today I infiltrated the right hallux 60 mg like Marcaine mixture sterile prep applied and using sterile instrumentation remove the lateral border exposed matrix and applied phenol 3 applications 30 seconds followed by alcohol lavage sterile dressing.  Gave instructions on soaks and dressing to leave on 24 hours and take it off earlier if any throbbing were to occur wrote prescription for drops and encouraged to call with questions

## 2019-07-26 ENCOUNTER — Ambulatory Visit: Payer: Medicare Other | Admitting: Cardiovascular Disease

## 2019-07-26 ENCOUNTER — Other Ambulatory Visit: Payer: Self-pay

## 2019-07-26 ENCOUNTER — Encounter: Payer: Self-pay | Admitting: Cardiovascular Disease

## 2019-07-26 DIAGNOSIS — E782 Mixed hyperlipidemia: Secondary | ICD-10-CM | POA: Diagnosis not present

## 2019-07-26 DIAGNOSIS — I2510 Atherosclerotic heart disease of native coronary artery without angina pectoris: Secondary | ICD-10-CM | POA: Insufficient documentation

## 2019-07-26 DIAGNOSIS — I25119 Atherosclerotic heart disease of native coronary artery with unspecified angina pectoris: Secondary | ICD-10-CM

## 2019-07-26 DIAGNOSIS — E785 Hyperlipidemia, unspecified: Secondary | ICD-10-CM | POA: Insufficient documentation

## 2019-07-26 DIAGNOSIS — I10 Essential (primary) hypertension: Secondary | ICD-10-CM | POA: Diagnosis not present

## 2019-07-26 NOTE — Assessment & Plan Note (Signed)
He is on atorvastatin with lipid profile performed 04/19/2018 revealing a total cholesterol 132, LDL 77 and HDL 43

## 2019-07-26 NOTE — Assessment & Plan Note (Signed)
History essentially potential blood pressure measured today at 108/73.  He is on Micardis and diltiazem.

## 2019-07-26 NOTE — Patient Instructions (Signed)
Medication Instructions:  Your physician recommends that you continue on your current medications as directed. Please refer to the Current Medication list given to you today.  If you need a refill on your cardiac medications before your next appointment, please call your pharmacy.   Lab work: none If you have labs (blood work) drawn today and your tests are completely normal, you will receive your results only by: Marland Kitchen MyChart Message (if you have MyChart) OR . A paper copy in the mail If you have any lab test that is abnormal or we need to change your treatment, we will call you to review the results.  Testing/Procedures: none  Follow-Up: At [facility], you and your health needs are our priority.  As part of our continuing mission to provide you with exceptional heart care, we have created designated Provider Care Teams.  These Care Teams include your primary Cardiologist (physician) and Advanced Practice Providers (APPs -  Physician Assistants and Nurse Practitioners) who all work together to provide you with the care you need, when you need it. You will need a follow up appointment in 12 months with Dr. Quay Burow.  Please call our office 2 months in advance to schedule this/each appointment.

## 2019-07-26 NOTE — Progress Notes (Signed)
07/26/2019 Francisco Austin   [birth year]  [id number]  Primary Physician [name], MD Primary Cardiologist: Lorretta Harp MD [name], [name]  HPI:  Francisco Austin is a 68 y.o. father of 2 children, with no grandchildren, former patient of Dr. Thurman Coyer, who was referred to me by Dr. [name] to be established in my practice because of known coronary artery disease.  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  He does have treated hypertension hyperlipidemia.  His mother [name] who died in 03-Feb-2017 was my patient as well.  He is never had a heart attack or stroke.  He is had several heart catheterizations Dr. [name] remotely with one PCI but no stent.  He does get frequent atypical chest pain.  His last 2D echo performed 04/06/2017 was essentially normal.   Current Meds  Medication Sig  . aspirin EC 81 MG tablet Take 81 mg by mouth daily.  Marland Kitchen atorvastatin (LIPITOR) 40 MG tablet atorvastatin 40 mg tablet  . Cholecalciferol 50 MCG (2000 UT) TABS Take by mouth.  . diclofenac sodium (VOLTAREN) 1 % GEL Apply 1 g topically as needed.  . diltiazem (DILTIAZEM CD) 240 MG 24 hr capsule diltiazem CD 240 mg capsule,extended release 24 hr  . esomeprazole (NEXIUM) 40 MG capsule Take by mouth.  Marland Kitchen HYDROcodone-acetaminophen (NORCO) 10-325 MG tablet hydrocodone 10 mg-acetaminophen 325 mg tablet  . LORazepam (ATIVAN) 1 MG tablet lorazepam 1 mg tablet  . nitroGLYCERIN (NITROSTAT) 0.4 MG SL tablet nitroglycerin 0.4 mg sublingual tablet  . telmisartan (MICARDIS) 80 MG tablet telmisartan 80 mg tablet  . umeclidinium-vilanterol (ANORO ELLIPTA) 62.5-25 MCG/INH AEPB Anoro Ellipta 62.5 mcg-25 mcg/actuation powder for inhalation     Allergies  Allergen Reactions  . Gabapentin Anaphylaxis and Other (See Comments)    B/P dropped to "40"   . Morphine Other (See Comments)  . Other Anaphylaxis    Androgenic Anabolic Steroid  . Prednisone Anaphylaxis  . Niacin   . Penicillamine  Hives    Social History   Socioeconomic History  . Marital status: Single    Spouse name: Not on file  . Number of children: Not on file  . Years of education: Not on file  . Highest education level: Not on file  Occupational History  . Not on file  Social Needs  . Financial resource strain: Not on file  . Food insecurity    Worry: Not on file    Inability: Not on file  . Transportation needs    Medical: Not on file    Non-medical: Not on file  Tobacco Use  . Smoking status: Current Every Day Smoker    Types: Pipe  . Smokeless tobacco: Never Used  Substance and Sexual Activity  . Alcohol use: Not on file  . Drug use: Not on file  . Sexual activity: Not on file  Lifestyle  . Physical activity    Days per week: Not on file    Minutes per session: Not on file  . Stress: Not on file  Relationships  . Social Herbalist on phone: Not on file    Gets together: Not on file    Attends religious service: Not on file    Active member of club or organization: Not on file    Attends meetings of clubs or organizations: Not on file    Relationship status: Not on file  . Intimate partner violence  Fear of current or ex partner: Not on file    Emotionally abused: Not on file    Physically abused: Not on file    Forced sexual activity: Not on file  Other Topics Concern  . Not on file  Social History Narrative  . Not on file     Review of Systems: General: negative for chills, fever, night sweats or weight changes.  Cardiovascular: negative for chest pain, dyspnea on exertion, edema, orthopnea, palpitations, paroxysmal nocturnal dyspnea or shortness of breath Dermatological: negative for rash Respiratory: negative for cough or wheezing Urologic: negative for hematuria Abdominal: negative for nausea, vomiting, diarrhea, bright red blood per rectum, melena, or hematemesis Neurologic: negative for visual changes, syncope, or dizziness All other systems reviewed and  are otherwise negative except as noted above.    Blood pressure 108/73, pulse 85, height 5\' 11"  (1.803 m), weight 205 lb 3.2 oz (93.1 kg), SpO2 95 %.  General appearance: alert and no distress Neck: no adenopathy, no carotid bruit, no JVD, supple, symmetrical, trachea midline and thyroid not enlarged, symmetric, no tenderness/mass/nodules Lungs: clear to auscultation bilaterally Heart: regular rate and rhythm, S1, S2 normal, no murmur, click, rub or gallop Extremities: extremities normal, atraumatic, no cyanosis or edema Pulses: 2+ and symmetric Skin: Skin color, texture, turgor normal. No rashes or lesions Neurologic: Alert and oriented X 3, normal strength and tone. Normal symmetric reflexes. Normal coordination and gait  EKG sinus rhythm 82 without ST or T wave changes.  I Personally reviewed this EKG.  ASSESSMENT AND PLAN:   Essential hypertension History essentially potential blood pressure measured today at 108/73.  He is on Micardis and diltiazem.  Hyperlipidemia He is on atorvastatin with lipid profile performed 04/19/2018 revealing a total cholesterol 132, LDL 77 and HDL 43  Coronary artery disease History of CAD status post multiple cardiac catheterizations remotely by Dr. [name] with PCI around year 2000 but no stenting.  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  He gets daily atypical chest pain which is not changed in frequency or severity.  No further evaluation at this time.      Lorretta Harp MD FACP,FACC,FAHA, [facility] 07/26/2019 8:49 AM

## 2019-07-26 NOTE — Assessment & Plan Note (Signed)
History of CAD status post multiple cardiac catheterizations remotely by Dr. [name] with PCI around year 2000 but no stenting.  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  He gets daily atypical chest pain which is not changed in frequency or severity.  No further evaluation at this time.

## 2019-12-13 ENCOUNTER — Ambulatory Visit: Payer: BC Managed Care – PPO | Attending: Internal Medicine

## 2019-12-13 DIAGNOSIS — Z23 Encounter for immunization: Secondary | ICD-10-CM | POA: Insufficient documentation

## 2019-12-13 NOTE — Progress Notes (Signed)
   Covid-19 Vaccination Clinic  Name:  Francisco Austin    MRN: [id number] DOB: [birth year]  12/13/2019  [name] was observed post Covid-19 immunization for 30 minutes based on pre-vaccination screening without incidence. He was provided with Vaccine Information Sheet and instruction to access the V-Safe system.   Mr. Reading was instructed to call 911 with any severe reactions post vaccine: Marland Kitchen Difficulty breathing  . Swelling of your face and throat  . A fast heartbeat  . A bad rash all over your body  . Dizziness and weakness    Immunizations Administered    Name Date Dose VIS Date Route   Pfizer COVID-19 Vaccine 12/13/2019  2:03 PM 0.3 mL 10/14/2019 Intramuscular   Manufacturer: [place]   Lot: [id number]   [place]: [address]-5

## 2019-12-15 ENCOUNTER — Ambulatory Visit: Payer: BC Managed Care – PPO

## 2020-01-07 ENCOUNTER — Ambulatory Visit: Payer: BC Managed Care – PPO | Attending: Internal Medicine

## 2020-01-07 DIAGNOSIS — Z23 Encounter for immunization: Secondary | ICD-10-CM | POA: Insufficient documentation

## 2020-01-07 NOTE — Progress Notes (Signed)
   Covid-19 Vaccination Clinic  Name:  Francisco Austin    MRN: [id number] DOB: 06-01-1951  01/07/2020  [name] was observed post Covid-19 immunization for 15 minutes without incident. He was provided with Vaccine Information Sheet and instruction to access the V-Safe system.   [name] was instructed to call 911 with any severe reactions post vaccine: Marland Kitchen Difficulty breathing  . Swelling of face and throat  . A fast heartbeat  . A bad rash all over body  . Dizziness and weakness   Immunizations Administered    Name Date Dose VIS Date Route   Pfizer COVID-19 Vaccine 01/07/2020 11:57 AM 0.3 mL 10/14/2019 Intramuscular   Manufacturer: [place]   Lot: [id number]   [place]: [id number]

## 2020-02-20 DIAGNOSIS — J40 Bronchitis, not specified as acute or chronic: Secondary | ICD-10-CM | POA: Insufficient documentation

## 2020-11-09 DIAGNOSIS — E119 Type 2 diabetes mellitus without complications: Secondary | ICD-10-CM | POA: Diagnosis not present

## 2020-11-30 ENCOUNTER — Encounter: Payer: Self-pay | Admitting: Cardiovascular Disease

## 2020-11-30 ENCOUNTER — Other Ambulatory Visit: Payer: Self-pay

## 2020-11-30 ENCOUNTER — Ambulatory Visit (INDEPENDENT_AMBULATORY_CARE_PROVIDER_SITE_OTHER): Payer: Medicare Other | Admitting: Cardiovascular Disease

## 2020-11-30 DIAGNOSIS — I10 Essential (primary) hypertension: Secondary | ICD-10-CM

## 2020-11-30 DIAGNOSIS — I25119 Atherosclerotic heart disease of native coronary artery with unspecified angina pectoris: Secondary | ICD-10-CM | POA: Diagnosis not present

## 2020-11-30 DIAGNOSIS — E782 Mixed hyperlipidemia: Secondary | ICD-10-CM

## 2020-11-30 NOTE — Assessment & Plan Note (Signed)
History of hyperlipidemia on statin therapy lipid profile performed 07/23/2020 revealing total cholesterol 130, LDL 77 and HDL of 39.

## 2020-11-30 NOTE — Progress Notes (Signed)
11/30/2020 Francisco Austin   12/13/1950  [id number]  Primary Physician [name], MD Primary Cardiologist: Lorretta Harp MD [name], [name]  HPI:  Francisco Austin is a 70 y.o.   father of 2 children, with no grandchildren, former patient of Dr. Thurman Coyer, who was referred to me by Dr. [name] to be established in my practice because of known coronary artery disease.  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  I last saw him in the office 07/27/2019. He does have treated hypertension hyperlipidemia.  His mother [name] who died in February 03, 2017 was my patient as well.  He is never had a heart attack or stroke.  He is had several heart catheterizations Dr. [name] remotely with one PCI but no stent.  He does get frequent atypical chest pain.  His last 2D echo performed 04/06/2017 was essentially normal.  Since I saw him over a year ago he continues to have atypical chest pain which has not changed in frequency or severity.  Does also have spinal stenosis and has difficulty ambulating.   Current Meds  Medication Sig  . aspirin EC 81 MG tablet Take 81 mg by mouth daily.  Marland Kitchen atorvastatin (LIPITOR) 40 MG tablet atorvastatin 40 mg tablet  . diclofenac sodium (VOLTAREN) 1 % GEL Apply 1 g topically as needed.  . diltiazem (CARDIZEM CD) 240 MG 24 hr capsule diltiazem CD 240 mg capsule,extended release 24 hr  . esomeprazole (NEXIUM) 40 MG capsule Take by mouth.  Marland Kitchen HYDROcodone-acetaminophen (NORCO) 10-325 MG tablet hydrocodone 10 mg-acetaminophen 325 mg tablet  . LORazepam (ATIVAN) 1 MG tablet lorazepam 1 mg tablet  . nitroGLYCERIN (NITROSTAT) 0.4 MG SL tablet nitroglycerin 0.4 mg sublingual tablet  . telmisartan (MICARDIS) 80 MG tablet telmisartan 80 mg tablet  . umeclidinium-vilanterol (ANORO ELLIPTA) 62.5-25 MCG/INH AEPB Anoro Ellipta 62.5 mcg-25 mcg/actuation powder for inhalation     Allergies  Allergen Reactions  . Gabapentin Anaphylaxis and Other (See Comments)     B/P dropped to "40"   . Morphine Other (See Comments)  . Other Anaphylaxis    Androgenic Anabolic Steroid  . Prednisone Anaphylaxis  . Niacin   . Penicillamine Hives    Social History   Socioeconomic History  . Marital status: Married    Spouse name: Not on file  . Number of children: Not on file  . Years of education: Not on file  . Highest education level: Not on file  Occupational History  . Not on file  Tobacco Use  . Smoking status: Current Every Day Smoker    Types: Pipe  . Smokeless tobacco: Never Used  Substance and Sexual Activity  . Alcohol use: Not on file  . Drug use: Not on file  . Sexual activity: Not on file  Other Topics Concern  . Not on file  Social History Narrative  . Not on file   Social Determinants of Health   Financial Resource Strain: Not on file  Food Insecurity: Not on file  Transportation Needs: Not on file  Physical Activity: Not on file  Stress: Not on file  Social Connections: Not on file  Intimate Partner Violence: Not on file     Review of Systems: General: negative for chills, fever, night sweats or weight changes.  Cardiovascular: negative for chest pain, dyspnea on exertion, edema, orthopnea, palpitations, paroxysmal nocturnal dyspnea or shortness of breath Dermatological: negative for rash Respiratory: negative for cough or wheezing Urologic: negative  for hematuria Abdominal: negative for nausea, vomiting, diarrhea, bright red blood per rectum, melena, or hematemesis Neurologic: negative for visual changes, syncope, or dizziness All other systems reviewed and are otherwise negative except as noted above.    Blood pressure (!) 92/50, pulse 80, height 5\' 11"  (1.803 m), weight 216 lb (98 kg).  General appearance: alert and no distress Neck: no adenopathy, no carotid bruit, no JVD, supple, symmetrical, trachea midline and thyroid not enlarged, symmetric, no tenderness/mass/nodules Lungs: clear to auscultation  bilaterally Heart: regular rate and rhythm, S1, S2 normal, no murmur, click, rub or gallop Extremities: extremities normal, atraumatic, no cyanosis or edema Pulses: 2+ and symmetric Skin: Skin color, texture, turgor normal. No rashes or lesions Neurologic: Alert and oriented X 3, normal strength and tone. Normal symmetric reflexes. Normal coordination and gait  EKG sinus rhythm at 80 with nonspecific ST and T wave changes.  I personally reviewed this EKG.  ASSESSMENT AND PLAN:   Essential hypertension History of essential hypertension a blood pressure measured today at 92/50.  He is on diltiazem and Micardis.  Hyperlipidemia History of hyperlipidemia on statin therapy lipid profile performed 07/23/2020 revealing total cholesterol 130, LDL 77 and HDL of 39.  Coronary artery disease History of coronary vasospasm status post remote cardiac catheterizations as far back as 2000 with apparently PTA but no stenting by Dr. [name].  He does get chronic atypical chest pain which is not changed in frequency or severity.      Lorretta Harp MD FACP,FACC,FAHA, [facility] 11/30/2020 3:02 PM

## 2020-11-30 NOTE — Assessment & Plan Note (Signed)
History of essential hypertension a blood pressure measured today at 92/50.  He is on diltiazem and Micardis.

## 2020-11-30 NOTE — Assessment & Plan Note (Signed)
History of coronary vasospasm status post remote cardiac catheterizations as far back as 2000 with apparently PTA but no stenting by Dr. [name].  He does get chronic atypical chest pain which is not changed in frequency or severity.

## 2020-11-30 NOTE — Patient Instructions (Signed)

## 2020-12-03 DIAGNOSIS — G8929 Other chronic pain: Secondary | ICD-10-CM | POA: Diagnosis not present

## 2020-12-03 DIAGNOSIS — M150 Primary generalized (osteo)arthritis: Secondary | ICD-10-CM | POA: Diagnosis not present

## 2020-12-03 DIAGNOSIS — E1169 Type 2 diabetes mellitus with other specified complication: Secondary | ICD-10-CM | POA: Diagnosis not present

## 2020-12-03 DIAGNOSIS — I10 Essential (primary) hypertension: Secondary | ICD-10-CM | POA: Diagnosis not present

## 2020-12-03 DIAGNOSIS — K219 Gastro-esophageal reflux disease without esophagitis: Secondary | ICD-10-CM | POA: Diagnosis not present

## 2020-12-03 DIAGNOSIS — E7800 Pure hypercholesterolemia, unspecified: Secondary | ICD-10-CM | POA: Diagnosis not present

## 2020-12-03 DIAGNOSIS — I2510 Atherosclerotic heart disease of native coronary artery without angina pectoris: Secondary | ICD-10-CM | POA: Diagnosis not present

## 2020-12-06 DIAGNOSIS — G8929 Other chronic pain: Secondary | ICD-10-CM | POA: Diagnosis not present

## 2020-12-06 DIAGNOSIS — M150 Primary generalized (osteo)arthritis: Secondary | ICD-10-CM | POA: Diagnosis not present

## 2020-12-06 DIAGNOSIS — E1169 Type 2 diabetes mellitus with other specified complication: Secondary | ICD-10-CM | POA: Diagnosis not present

## 2020-12-06 DIAGNOSIS — I2510 Atherosclerotic heart disease of native coronary artery without angina pectoris: Secondary | ICD-10-CM | POA: Diagnosis not present

## 2020-12-06 DIAGNOSIS — K219 Gastro-esophageal reflux disease without esophagitis: Secondary | ICD-10-CM | POA: Diagnosis not present

## 2020-12-06 DIAGNOSIS — I10 Essential (primary) hypertension: Secondary | ICD-10-CM | POA: Diagnosis not present

## 2020-12-06 DIAGNOSIS — E119 Type 2 diabetes mellitus without complications: Secondary | ICD-10-CM | POA: Diagnosis not present

## 2020-12-06 DIAGNOSIS — E7800 Pure hypercholesterolemia, unspecified: Secondary | ICD-10-CM | POA: Diagnosis not present

## 2021-01-01 ENCOUNTER — Ambulatory Visit: Payer: Medicare Other | Admitting: Cardiovascular Disease

## 2021-01-17 ENCOUNTER — Ambulatory Visit: Payer: Medicare Other | Admitting: Physician Assistant

## 2021-01-30 DIAGNOSIS — E1169 Type 2 diabetes mellitus with other specified complication: Secondary | ICD-10-CM | POA: Diagnosis not present

## 2021-01-30 DIAGNOSIS — K219 Gastro-esophageal reflux disease without esophagitis: Secondary | ICD-10-CM | POA: Diagnosis not present

## 2021-01-30 DIAGNOSIS — E119 Type 2 diabetes mellitus without complications: Secondary | ICD-10-CM | POA: Diagnosis not present

## 2021-01-30 DIAGNOSIS — G8929 Other chronic pain: Secondary | ICD-10-CM | POA: Diagnosis not present

## 2021-01-30 DIAGNOSIS — I10 Essential (primary) hypertension: Secondary | ICD-10-CM | POA: Diagnosis not present

## 2021-01-30 DIAGNOSIS — M150 Primary generalized (osteo)arthritis: Secondary | ICD-10-CM | POA: Diagnosis not present

## 2021-01-30 DIAGNOSIS — I2510 Atherosclerotic heart disease of native coronary artery without angina pectoris: Secondary | ICD-10-CM | POA: Diagnosis not present

## 2021-01-30 DIAGNOSIS — E7800 Pure hypercholesterolemia, unspecified: Secondary | ICD-10-CM | POA: Diagnosis not present

## 2021-02-01 ENCOUNTER — Ambulatory Visit (INDEPENDENT_AMBULATORY_CARE_PROVIDER_SITE_OTHER): Payer: Medicare Other | Admitting: Orthopaedic Surgery

## 2021-02-01 ENCOUNTER — Encounter: Payer: Self-pay | Admitting: Orthopaedic Surgery

## 2021-02-01 ENCOUNTER — Other Ambulatory Visit: Payer: Self-pay

## 2021-02-01 DIAGNOSIS — M545 Low back pain, unspecified: Secondary | ICD-10-CM

## 2021-02-01 DIAGNOSIS — G8929 Other chronic pain: Secondary | ICD-10-CM | POA: Diagnosis not present

## 2021-02-01 NOTE — Progress Notes (Signed)
Office Visit Note   Patient: Francisco Austin           Date of Birth: [birth year]           MRN: [id number] Visit Date: 02/01/2021              Requested by: [name], MD [place],  [place] [id number] PCP: [name], MD   Assessment & Plan: Visit Diagnoses:  1. Chronic low back pain without sciatica, unspecified back pain laterality     Plan: Long discussion with patient that MRI does not show any areas of significant compression no areas of instability.  Mild facet changes for his age at 41.  He may have some hyperalgesia from long-term narcotic usage and might actually get significant improvement in his pain if he would wean off and stop his narcotics.  I discussed this with him in detail but he is been on this medicine for many years and this is unlikely to change.  No additional studies are indicated at this point.  Follow-Up Instructions: No follow-ups on file.   Orders:  No orders of the defined types were placed in this encounter.  No orders of the defined types were placed in this encounter.     Procedures: No procedures performed   Clinical Data: No additional findings.   Subjective: Chief Complaint  Patient presents with  . Lower Back - Pain    HPI 70 year old male here for second opinion concerning chronic back pain.  He states he was exposed to agent orange he is service-connected for this he has had problems with chronic chest pain also problems with chronic headaches and is on hydrocodone 10/325 5 tablets daily, 150 tablets a month.  He is also on lorazepam 1 mg 3 times daily.  Patient has been on pain management for at least 8 years.  He has had previous epidurals that did not help and can play golf once a week but then has increased pain.  He has been through physical therapy which did not help.  Patient had the MRI scan I have 1 on the disc available for review from 2019 which shows some minimal bulges no areas of  moderate or severe stenosis.  No central canal compression.  For his age she has mild lumbar facet degenerative changes.  Approximately 50 pages with notes MRI, office visits scans are included.  Last MRI 03/01/2020 showed mild improvement in L5-S1 shallow central protrusion without compression.  Mild bulges at other levels without compression.  No bowel or bladder associated symptoms.  Review of Systems all other systems noncontributory. Patient states he takes nitroglycerin for chest pain.  Objective: Vital Signs: There were no vitals taken for this visit.  Physical Exam Constitutional:      Appearance: He is well-developed.  HENT:     Head: Normocephalic and atraumatic.  Eyes:     Pupils: Pupils are equal, round, and reactive to light.  Neck:     Thyroid: No thyromegaly.     Trachea: No tracheal deviation.  Cardiovascular:     Rate and Rhythm: Normal rate.  Pulmonary:     Effort: Pulmonary effort is normal.     Breath sounds: No wheezing.  Abdominal:     General: Bowel sounds are normal.     Palpations: Abdomen is soft.  Skin:    General: Skin is warm and dry.     Capillary Refill: Capillary refill takes less than 2 seconds.  Neurological:     Mental Status: He is alert and oriented to person, place, and time.  Psychiatric:        Behavior: Behavior normal.        Thought Content: Thought content normal.        Judgment: Judgment normal.     Ortho Exam negative logroll the hips patient has slightly short stride gait slow.  Anterior tib EHL gastrocsoleus is strong no atrophy.  No pitting edema.  Knees reach full extension.  Specialty Comments:  No specialty comments available.  Imaging: No results found.   PMFS History: Patient Active Problem List   Diagnosis Date Noted  . Essential hypertension 07/26/2019  . Hyperlipidemia 07/26/2019  . Coronary artery disease 07/26/2019  . Lumbar radiculopathy 11/22/2018  . Chronic low back pain 04/22/2018  . Degeneration of  lumbar intervertebral disc 04/22/2018  . Chronic back pain 02/23/2018   History reviewed. No pertinent past medical history.  History reviewed. No pertinent family history.  History reviewed. No pertinent surgical history. Social History   Occupational History  . Not on file  Tobacco Use  . Smoking status: Current Every Day Smoker    Types: Pipe  . Smokeless tobacco: Never Used  Substance and Sexual Activity  . Alcohol use: Not on file  . Drug use: Not on file  . Sexual activity: Not on file

## 2021-02-19 DIAGNOSIS — E1169 Type 2 diabetes mellitus with other specified complication: Secondary | ICD-10-CM | POA: Diagnosis not present

## 2021-02-19 DIAGNOSIS — E7800 Pure hypercholesterolemia, unspecified: Secondary | ICD-10-CM | POA: Diagnosis not present

## 2021-02-19 DIAGNOSIS — G894 Chronic pain syndrome: Secondary | ICD-10-CM | POA: Diagnosis not present

## 2021-02-19 DIAGNOSIS — F17200 Nicotine dependence, unspecified, uncomplicated: Secondary | ICD-10-CM | POA: Diagnosis not present

## 2021-02-19 DIAGNOSIS — I10 Essential (primary) hypertension: Secondary | ICD-10-CM | POA: Diagnosis not present

## 2021-02-19 DIAGNOSIS — I2510 Atherosclerotic heart disease of native coronary artery without angina pectoris: Secondary | ICD-10-CM | POA: Diagnosis not present

## 2021-02-26 DIAGNOSIS — E7800 Pure hypercholesterolemia, unspecified: Secondary | ICD-10-CM | POA: Diagnosis not present

## 2021-02-26 DIAGNOSIS — I2510 Atherosclerotic heart disease of native coronary artery without angina pectoris: Secondary | ICD-10-CM | POA: Diagnosis not present

## 2021-02-26 DIAGNOSIS — G8929 Other chronic pain: Secondary | ICD-10-CM | POA: Diagnosis not present

## 2021-02-26 DIAGNOSIS — E1169 Type 2 diabetes mellitus with other specified complication: Secondary | ICD-10-CM | POA: Diagnosis not present

## 2021-02-26 DIAGNOSIS — E119 Type 2 diabetes mellitus without complications: Secondary | ICD-10-CM | POA: Diagnosis not present

## 2021-02-26 DIAGNOSIS — I10 Essential (primary) hypertension: Secondary | ICD-10-CM | POA: Diagnosis not present

## 2021-02-26 DIAGNOSIS — K219 Gastro-esophageal reflux disease without esophagitis: Secondary | ICD-10-CM | POA: Diagnosis not present

## 2021-02-26 DIAGNOSIS — M150 Primary generalized (osteo)arthritis: Secondary | ICD-10-CM | POA: Diagnosis not present

## 2021-05-27 DIAGNOSIS — M150 Primary generalized (osteo)arthritis: Secondary | ICD-10-CM | POA: Diagnosis not present

## 2021-05-27 DIAGNOSIS — G8929 Other chronic pain: Secondary | ICD-10-CM | POA: Diagnosis not present

## 2021-05-27 DIAGNOSIS — I10 Essential (primary) hypertension: Secondary | ICD-10-CM | POA: Diagnosis not present

## 2021-05-27 DIAGNOSIS — E7800 Pure hypercholesterolemia, unspecified: Secondary | ICD-10-CM | POA: Diagnosis not present

## 2021-05-27 DIAGNOSIS — E1169 Type 2 diabetes mellitus with other specified complication: Secondary | ICD-10-CM | POA: Diagnosis not present

## 2021-05-27 DIAGNOSIS — K219 Gastro-esophageal reflux disease without esophagitis: Secondary | ICD-10-CM | POA: Diagnosis not present

## 2021-05-27 DIAGNOSIS — I2510 Atherosclerotic heart disease of native coronary artery without angina pectoris: Secondary | ICD-10-CM | POA: Diagnosis not present

## 2021-05-27 DIAGNOSIS — E119 Type 2 diabetes mellitus without complications: Secondary | ICD-10-CM | POA: Diagnosis not present

## 2021-06-17 DIAGNOSIS — H5203 Hypermetropia, bilateral: Secondary | ICD-10-CM | POA: Diagnosis not present

## 2021-06-17 DIAGNOSIS — H524 Presbyopia: Secondary | ICD-10-CM | POA: Diagnosis not present

## 2021-06-17 DIAGNOSIS — Z961 Presence of intraocular lens: Secondary | ICD-10-CM | POA: Diagnosis not present

## 2021-06-18 DIAGNOSIS — E1169 Type 2 diabetes mellitus with other specified complication: Secondary | ICD-10-CM | POA: Diagnosis not present

## 2021-06-18 DIAGNOSIS — M150 Primary generalized (osteo)arthritis: Secondary | ICD-10-CM | POA: Diagnosis not present

## 2021-06-18 DIAGNOSIS — G8929 Other chronic pain: Secondary | ICD-10-CM | POA: Diagnosis not present

## 2021-06-18 DIAGNOSIS — K219 Gastro-esophageal reflux disease without esophagitis: Secondary | ICD-10-CM | POA: Diagnosis not present

## 2021-06-18 DIAGNOSIS — I10 Essential (primary) hypertension: Secondary | ICD-10-CM | POA: Diagnosis not present

## 2021-06-18 DIAGNOSIS — I2510 Atherosclerotic heart disease of native coronary artery without angina pectoris: Secondary | ICD-10-CM | POA: Diagnosis not present

## 2021-06-18 DIAGNOSIS — E7800 Pure hypercholesterolemia, unspecified: Secondary | ICD-10-CM | POA: Diagnosis not present

## 2021-06-18 DIAGNOSIS — E119 Type 2 diabetes mellitus without complications: Secondary | ICD-10-CM | POA: Diagnosis not present

## 2021-07-30 DIAGNOSIS — E7800 Pure hypercholesterolemia, unspecified: Secondary | ICD-10-CM | POA: Diagnosis not present

## 2021-07-30 DIAGNOSIS — E1169 Type 2 diabetes mellitus with other specified complication: Secondary | ICD-10-CM | POA: Diagnosis not present

## 2021-07-30 DIAGNOSIS — M150 Primary generalized (osteo)arthritis: Secondary | ICD-10-CM | POA: Diagnosis not present

## 2021-07-30 DIAGNOSIS — K219 Gastro-esophageal reflux disease without esophagitis: Secondary | ICD-10-CM | POA: Diagnosis not present

## 2021-07-30 DIAGNOSIS — I10 Essential (primary) hypertension: Secondary | ICD-10-CM | POA: Diagnosis not present

## 2021-07-30 DIAGNOSIS — I2510 Atherosclerotic heart disease of native coronary artery without angina pectoris: Secondary | ICD-10-CM | POA: Diagnosis not present

## 2021-08-01 DIAGNOSIS — I10 Essential (primary) hypertension: Secondary | ICD-10-CM | POA: Diagnosis not present

## 2021-08-01 DIAGNOSIS — G894 Chronic pain syndrome: Secondary | ICD-10-CM | POA: Diagnosis not present

## 2021-08-01 DIAGNOSIS — E1169 Type 2 diabetes mellitus with other specified complication: Secondary | ICD-10-CM | POA: Diagnosis not present

## 2021-08-01 DIAGNOSIS — E7800 Pure hypercholesterolemia, unspecified: Secondary | ICD-10-CM | POA: Diagnosis not present

## 2021-08-01 DIAGNOSIS — F17200 Nicotine dependence, unspecified, uncomplicated: Secondary | ICD-10-CM | POA: Diagnosis not present

## 2021-08-01 DIAGNOSIS — Z0000 Encounter for general adult medical examination without abnormal findings: Secondary | ICD-10-CM | POA: Diagnosis not present

## 2021-08-01 DIAGNOSIS — Z23 Encounter for immunization: Secondary | ICD-10-CM | POA: Diagnosis not present

## 2021-08-28 DIAGNOSIS — E1169 Type 2 diabetes mellitus with other specified complication: Secondary | ICD-10-CM | POA: Diagnosis not present

## 2021-08-28 DIAGNOSIS — M150 Primary generalized (osteo)arthritis: Secondary | ICD-10-CM | POA: Diagnosis not present

## 2021-08-28 DIAGNOSIS — I2510 Atherosclerotic heart disease of native coronary artery without angina pectoris: Secondary | ICD-10-CM | POA: Diagnosis not present

## 2021-08-28 DIAGNOSIS — I10 Essential (primary) hypertension: Secondary | ICD-10-CM | POA: Diagnosis not present

## 2021-08-28 DIAGNOSIS — E7800 Pure hypercholesterolemia, unspecified: Secondary | ICD-10-CM | POA: Diagnosis not present

## 2021-08-28 DIAGNOSIS — K219 Gastro-esophageal reflux disease without esophagitis: Secondary | ICD-10-CM | POA: Diagnosis not present

## 2021-09-06 ENCOUNTER — Encounter (HOSPITAL_COMMUNITY): Payer: Self-pay | Admitting: Emergency Medicine

## 2021-09-06 ENCOUNTER — Emergency Department (HOSPITAL_COMMUNITY): Payer: Medicare Other

## 2021-09-06 ENCOUNTER — Other Ambulatory Visit: Payer: Self-pay

## 2021-09-06 ENCOUNTER — Emergency Department (HOSPITAL_COMMUNITY)
Admission: EM | Admit: 2021-09-06 | Discharge: 2021-09-06 | Disposition: A | Discharge status: Home / Self Care | Payer: Medicare Other | Attending: Emergency Medicine | Admitting: Emergency Medicine

## 2021-09-06 DIAGNOSIS — R079 Chest pain, unspecified: Secondary | ICD-10-CM | POA: Diagnosis not present

## 2021-09-06 DIAGNOSIS — J9811 Atelectasis: Secondary | ICD-10-CM | POA: Diagnosis not present

## 2021-09-06 DIAGNOSIS — Z7982 Long term (current) use of aspirin: Secondary | ICD-10-CM | POA: Insufficient documentation

## 2021-09-06 DIAGNOSIS — R0789 Other chest pain: Secondary | ICD-10-CM | POA: Diagnosis not present

## 2021-09-06 DIAGNOSIS — F17290 Nicotine dependence, other tobacco product, uncomplicated: Secondary | ICD-10-CM | POA: Diagnosis not present

## 2021-09-06 DIAGNOSIS — R109 Unspecified abdominal pain: Secondary | ICD-10-CM | POA: Diagnosis not present

## 2021-09-06 DIAGNOSIS — N200 Calculus of kidney: Secondary | ICD-10-CM | POA: Diagnosis not present

## 2021-09-06 DIAGNOSIS — I700 Atherosclerosis of aorta: Secondary | ICD-10-CM | POA: Diagnosis not present

## 2021-09-06 DIAGNOSIS — I10 Essential (primary) hypertension: Secondary | ICD-10-CM | POA: Diagnosis not present

## 2021-09-06 DIAGNOSIS — R0602 Shortness of breath: Secondary | ICD-10-CM | POA: Diagnosis present

## 2021-09-06 DIAGNOSIS — I2510 Atherosclerotic heart disease of native coronary artery without angina pectoris: Secondary | ICD-10-CM | POA: Diagnosis not present

## 2021-09-06 DIAGNOSIS — D3500 Benign neoplasm of unspecified adrenal gland: Secondary | ICD-10-CM | POA: Diagnosis not present

## 2021-09-06 DIAGNOSIS — Z955 Presence of coronary angioplasty implant and graft: Secondary | ICD-10-CM | POA: Diagnosis not present

## 2021-09-06 DIAGNOSIS — R071 Chest pain on breathing: Secondary | ICD-10-CM | POA: Insufficient documentation

## 2021-09-06 DIAGNOSIS — I2699 Other pulmonary embolism without acute cor pulmonale: Secondary | ICD-10-CM | POA: Diagnosis not present

## 2021-09-06 LAB — CBC WITH DIFFERENTIAL/PLATELET
Abs Immature Granulocytes: 0.02 10*3/uL (ref 0.00–0.07)
Basophils Absolute: 0 10*3/uL (ref 0.0–0.1)
Basophils Relative: 1 %
Eosinophils Absolute: 0.2 10*3/uL (ref 0.0–0.5)
Eosinophils Relative: 3 %
HCT: 43.4 % (ref 39.0–52.0)
Hemoglobin: 14.4 g/dL (ref 13.0–17.0)
Immature Granulocytes: 0 %
Lymphocytes Relative: 28 %
Lymphs Abs: 2.2 10*3/uL (ref 0.7–4.0)
MCH: 30.9 pg (ref 26.0–34.0)
MCHC: 33.2 g/dL (ref 30.0–36.0)
MCV: 93.1 fL (ref 80.0–100.0)
Monocytes Absolute: 0.6 10*3/uL (ref 0.1–1.0)
Monocytes Relative: 8 %
Neutro Abs: 4.9 10*3/uL (ref 1.7–7.7)
Neutrophils Relative %: 60 %
Platelets: 341 10*3/uL (ref 150–400)
RBC: 4.66 MIL/uL (ref 4.22–5.81)
RDW: 14.1 % (ref 11.5–15.5)
WBC: 8 10*3/uL (ref 4.0–10.5)
nRBC: 0 % (ref 0.0–0.2)

## 2021-09-06 LAB — COMPREHENSIVE METABOLIC PANEL
ALT: 21 U/L (ref 0–44)
AST: 17 U/L (ref 15–41)
Albumin: 4.3 g/dL (ref 3.5–5.0)
Alkaline Phosphatase: 79 U/L (ref 38–126)
Anion gap: 9 (ref 5–15)
BUN: 9 mg/dL (ref 8–23)
CO2: 24 mmol/L (ref 22–32)
Calcium: 9 mg/dL (ref 8.9–10.3)
Chloride: 102 mmol/L (ref 98–111)
Creatinine, Ser: 0.75 mg/dL (ref 0.61–1.24)
GFR, Estimated: >60 mL/min (ref >60)
Glucose, Bld: 104 mg/dL — ABNORMAL HIGH (ref 70–99)
Potassium: 4.3 mmol/L (ref 3.5–5.1)
Sodium: 135 mmol/L (ref 135–145)
Total Bilirubin: 0.6 mg/dL (ref 0.3–1.2)
Total Protein: 7.6 g/dL (ref 6.5–8.1)

## 2021-09-06 LAB — TROPONIN I (HIGH SENSITIVITY): Troponin I (High Sensitivity): 4 ng/L (ref <18)

## 2021-09-06 MED ORDER — METHOCARBAMOL 500 MG PO TABS: 500.0000 mg | ORAL_TABLET | Freq: Two times a day (BID) | ORAL | 0 refills | Status: AC

## 2021-09-06 MED ORDER — IOHEXOL 350 MG/ML SOLN
75.0000 mL | Freq: Once | INTRAVENOUS | Status: AC | PRN
  Administered 2021-09-06: 75 mL via INTRAVENOUS

## 2021-09-06 MED ORDER — LIDOCAINE 5 % EX PTCH: 1.0000 | MEDICATED_PATCH | CUTANEOUS | 0 refills | Status: DC

## 2021-09-06 MED ORDER — KETOROLAC TROMETHAMINE 30 MG/ML IJ SOLN
30.0000 mg | Freq: Once | INTRAMUSCULAR | Status: AC
  Administered 2021-09-06: 30 mg via INTRAVENOUS
  Filled 2021-09-06: qty 1

## 2021-09-06 MED ORDER — KETOROLAC TROMETHAMINE 30 MG/ML IJ SOLN: 30.0000 mg | Freq: Once | INTRAMUSCULAR | Status: DC

## 2021-09-06 NOTE — ED Provider Notes (Signed)
Accepted handoff at shift change from [facility]. Please see prior provider note for more detail. Her HPI is:  Francisco Austin is a 70 y.o. male.  Patient with past medical history of hypertension, hyperlipidemia, coronary artery disease with multiple stents.  He presents today with complaints of right rib cage, right flank pain.  He says that he noticed this about 1 week ago when he was golfing and he feels like he pulled something.  He says his symptoms have not gotten any better since then.  He says the pain is worse when he takes a deep breath, movement.  Radiates to his back and across his abdomen towards the epigastric region.  Not improved with Tylenol administration.  He claims that he has had some shortness of breath with feeling like he can barely breathe the pain.  He called his PCP, who recommended he go to the emergency department, rule out for pulmonary embolism.  He denies any anterior chest pain unusual from his normal chest pain.  He denies any nausea, vomiting, diarrhea, constipation, numbness or tingling in extremities, weakness in extremities.  Briefly: Patient is 70 y.o.   DDX: concern for MSK pain of right shoulder / back 2/2 golf injury. Heavy cardiac history and PCP concerned about possible PE so workup for this was initiated.  Plan: d/c with MSK pain and close follow up if cardiac workup unremarkable.  At time of my evaluation the above workup was complete, patient with no evidence of AMI, PE, dissection. D/c with lidoderm patch, robaxin. Patient in stable condition.     Anselmo Pickler, PA-C 09/06/21 [id number]    Gareth Morgan, MD 09/07/21 [id number]

## 2021-09-06 NOTE — ED Triage Notes (Signed)
Patient believes he "tore something" a week ago playing golf. Since then he can feels he can barely breathe. He reports pain on his right side that radiates across his chest.

## 2021-09-06 NOTE — Discharge Instructions (Addendum)
You have been seen in the emergency department today.  Your imaging and labs were unremarkable.  I believe your injury is due to a musculoskeletal cause. Please alternate Tylenol and Ibuprofen for your pain. I have prescribed you a muscle relaxer and lidocaine patch to apply over the painful area. Please follow up with your PCP in the next week for reevaluation of your symptoms as needed.  You were given a prescription for Robaxin which is a muscle relaxer.  You should not drive, work, consume alcohol, or operate machinery while taking this medication as it can make you very drowsy.

## 2021-09-06 NOTE — ED Provider Notes (Signed)
[facility] DEPT Provider Note   CSN: [id number] Arrival date & time: 09/06/21  1312     History Chief Complaint  Patient presents with   Flank Pain   Shortness of Breath    Francisco Austin is a 70 y.o. male.  Patient with past medical history of hypertension, hyperlipidemia, coronary artery disease with multiple stents.  He presents today with complaints of right rib cage, right flank pain.  He says that he noticed this about 1 week ago when he was golfing and he feels like he pulled something.  He says his symptoms have not gotten any better since then.  He says the pain is worse when he takes a deep breath, movement.  Radiates to his back and across his abdomen towards the epigastric region.  Not improved with Tylenol administration.  He claims that he has had some shortness of breath with feeling like he can barely breathe the pain.  He called his PCP, who recommended he go to the emergency department, rule out for pulmonary embolism.  He denies any anterior chest pain unusual from his normal chest pain.  He denies any nausea, vomiting, diarrhea, constipation, numbness or tingling in extremities, weakness in extremities.   Flank Pain Associated symptoms include chest pain, abdominal pain and shortness of breath. Pertinent negatives include no headaches.  Shortness of Breath Associated symptoms: abdominal pain and chest pain   Associated symptoms: no cough, no diaphoresis, no fever, no headaches, no rash, no sore throat and no vomiting       History reviewed. No pertinent past medical history.  Patient Active Problem List   Diagnosis Date Noted   Essential hypertension 07/26/2019   Hyperlipidemia 07/26/2019   Coronary artery disease 07/26/2019   Lumbar radiculopathy 11/22/2018   Chronic low back pain 04/22/2018   Degeneration of lumbar intervertebral disc 04/22/2018   Chronic back pain 02/23/2018    History reviewed. No pertinent surgical  history.     History reviewed. No pertinent family history.  Social History   Tobacco Use   Smoking status: Every Day    Types: Pipe   Smokeless tobacco: Never    Home Medications Prior to Admission medications   Medication Sig Start Date End Date Taking? Authorizing Provider  lidocaine (LIDODERM) 5 % Place 1 patch onto the skin daily. Remove & Discard patch within 12 hours or as directed by MD 09/06/21  Yes Remmi Armenteros, Adora Fridge, PA-C  methocarbamol (ROBAXIN) 500 MG tablet Take 1 tablet (500 mg total) by mouth 2 (two) times daily for 14 days. 09/06/21 09/20/21 Yes Wojciech Willetts, Adora Fridge, PA-C  aspirin EC 81 MG tablet Take 81 mg by mouth daily.    [provider]  atorvastatin (LIPITOR) 40 MG tablet atorvastatin 40 mg tablet 10/02/16   [provider]  diclofenac sodium (VOLTAREN) 1 % GEL Apply 1 g topically as needed.    [provider]  diltiazem (CARDIZEM CD) 240 MG 24 hr capsule diltiazem CD 240 mg capsule,extended release 24 hr 11/07/16   [provider]  esomeprazole (NEXIUM) 40 MG capsule Take by mouth.    [provider]  HYDROcodone-acetaminophen (NORCO) 10-325 MG tablet hydrocodone 10 mg-acetaminophen 325 mg tablet    [provider]  LORazepam (ATIVAN) 1 MG tablet lorazepam 1 mg tablet 10/24/16   [provider]  nitroGLYCERIN (NITROSTAT) 0.4 MG SL tablet nitroglycerin 0.4 mg sublingual tablet 09/11/16   [provider]  telmisartan (MICARDIS) 80 MG tablet telmisartan 80 mg  tablet    [provider]  umeclidinium-vilanterol (ANORO ELLIPTA) 62.5-25 MCG/INH AEPB Anoro Ellipta 62.5 mcg-25 mcg/actuation powder for inhalation    [provider]    Allergies    Gabapentin, Morphine, Other, Prednisone, Niacin, and Penicillamine  Review of Systems   Review of Systems  Constitutional:  Negative for chills, diaphoresis and fever.  HENT:  Negative for congestion, rhinorrhea and sore throat.   Eyes:   Negative for visual disturbance.  Respiratory:  Positive for chest tightness and shortness of breath. Negative for cough.   Cardiovascular:  Positive for chest pain. Negative for palpitations and leg swelling.  Gastrointestinal:  Positive for abdominal pain. Negative for abdominal distention, blood in stool, constipation, diarrhea, nausea and vomiting.  Genitourinary:  Positive for flank pain. Negative for dysuria and hematuria.  Musculoskeletal:  Positive for back pain.  Skin:  Negative for rash and wound.  Neurological:  Negative for dizziness, syncope, weakness, light-headedness and headaches.  Psychiatric/Behavioral:  Negative for confusion.   All other systems reviewed and are negative.  Physical Exam Updated Vital Signs BP 130/80   Pulse 68   Temp 98.7 F (37.1 C) (Oral)   Resp 14   SpO2 99%   Physical Exam Vitals and nursing note reviewed.  Constitutional:      General: He is not in acute distress.    Appearance: Normal appearance. He is well-developed. He is not ill-appearing, toxic-appearing or diaphoretic.  HENT:     Head: Normocephalic and atraumatic.     Nose: No nasal deformity.     Mouth/Throat:     Lips: Pink. No lesions.     Mouth: Mucous membranes are moist. No injury, lacerations, oral lesions or angioedema.     Pharynx: Oropharynx is clear. Uvula midline. No pharyngeal swelling, oropharyngeal exudate, posterior oropharyngeal erythema or uvula swelling.  Eyes:     General: Gaze aligned appropriately. No scleral icterus.       Right eye: No discharge.        Left eye: No discharge.     Conjunctiva/sclera: Conjunctivae normal.     Right eye: Right conjunctiva is not injected. No exudate or hemorrhage.    Left eye: Left conjunctiva is not injected. No exudate or hemorrhage.    Pupils: Pupils are equal, round, and reactive to light.  Cardiovascular:     Rate and Rhythm: Normal rate and regular rhythm.     Pulses: Normal pulses.          Radial pulses are 2+ on  the right side and 2+ on the left side.       Dorsalis pedis pulses are 2+ on the right side and 2+ on the left side.     Heart sounds: Normal heart sounds, S1 normal and S2 normal. Heart sounds not distant. No murmur heard.   No friction rub. No gallop. No S3 or S4 sounds.  Pulmonary:     Effort: Pulmonary effort is normal. No accessory muscle usage or respiratory distress.     Breath sounds: Normal breath sounds. No stridor. No wheezing, rhonchi or rales.  Chest:     Chest wall: No tenderness.     Comments: Chest wall tenderness to the touch of the right lower rib cage.   Abdominal:     General: Abdomen is flat. Bowel sounds are normal. There is no distension.     Palpations: Abdomen is soft. There is no mass or pulsatile mass.     Tenderness: There is no abdominal tenderness.  There is right CVA tenderness. There is no left CVA tenderness, guarding or rebound.     Hernia: No hernia is present.     Comments: Right CVA tenderness  Musculoskeletal:     Cervical back: Neck supple.     Right lower leg: No edema.     Left lower leg: No edema.  Skin:    General: Skin is warm and dry.     Coloration: Skin is not jaundiced or pale.     Findings: No bruising, erythema, lesion or rash.  Neurological:     General: No focal deficit present.     Mental Status: He is alert and oriented to person, place, and time.     GCS: GCS eye subscore is 4. GCS verbal subscore is 5. GCS motor subscore is 6.  Psychiatric:        Mood and Affect: Mood normal.        Behavior: Behavior normal. Behavior is cooperative.    ED Results / Procedures / Treatments   Labs (all labs ordered are listed, but only abnormal results are displayed) Labs Reviewed  COMPREHENSIVE METABOLIC PANEL - Abnormal; Notable for the following components:      Result Value   Glucose, Bld 104 (*)    All other components within normal limits  CBC WITH DIFFERENTIAL/PLATELET  TROPONIN I (HIGH SENSITIVITY)  TROPONIN I (HIGH  SENSITIVITY)    EKG EKG Interpretation  Date/Time:  Friday September 06 2021 13:39:39 EDT Ventricular Rate:  71 PR Interval:  196 QRS Duration: 95 QT Interval:  404 QTC Calculation: 439 R Axis:   53 Text Interpretation: Sinus rhythm Abnormal R-wave progression, early transition Confirmed by Lennice Sites ([id number]) on 09/06/2021 1:45:08 PM  Radiology DG Chest 2 View  Result Date: 09/06/2021 CLINICAL DATA:  70 year old male with chest pain. EXAM: CHEST - 2 VIEW COMPARISON:  04/05/2016 FINDINGS: The mediastinal contours are within normal limits. No cardiomegaly. Atherosclerotic calcification of the aortic arch. Minimal bibasilar subsegmental atelectasis. The lungs are otherwise clear bilaterally without evidence of focal consolidation, pleural effusion, or pneumothorax. No acute osseous abnormality. IMPRESSION: Minimal bibasilar subsegmental atelectasis. Aortic Atherosclerosis (ICD10-I70.0). Electronically Signed   By: Ruthann Cancer M.D.   On: 09/06/2021 13:39   CT Angio Chest PE W/Cm &/Or Wo Cm  Result Date: 09/06/2021 CLINICAL DATA:  PE suspected, evaluate for rib fractures EXAM: CT ANGIOGRAPHY CHEST WITH CONTRAST TECHNIQUE: Multidetector CT imaging of the chest was performed using the standard protocol during bolus administration of intravenous contrast. Multiplanar CT image reconstructions and MIPs were obtained to evaluate the vascular anatomy. CONTRAST:  60mL OMNIPAQUE IOHEXOL 350 MG/ML SOLN COMPARISON:  None. FINDINGS: Cardiovascular: Examination for pulmonary embolism is somewhat limited by motion artifact. Within this limitation, no evidence of pulmonary embolism through the proximal segmental pulmonary arterial level. Normal heart size. Three-vessel coronary artery calcifications. No pericardial effusion. Aortic atherosclerosis. Mediastinum/Nodes: No enlarged mediastinal, hilar, or axillary lymph nodes. Thyroid gland, trachea, and esophagus demonstrate no significant findings. Lungs/Pleura:  Moderate centrilobular and paraseptal emphysema. Diffuse bilateral bronchial wall thickening. Scarring and or atelectasis of the bilateral lung bases. No pleural effusion or pneumothorax. Upper Abdomen: Please see separately reported examination of the abdomen and pelvis. Musculoskeletal: No chest wall abnormality. No acute or significant osseous findings. Review of the MIP images confirms the above findings. IMPRESSION: 1. Examination for pulmonary embolism is somewhat limited by motion artifact. Within this limitation, no evidence of pulmonary embolism through the proximal segmental pulmonary arterial level. 2. Emphysema. 3. No  evidence of rib fracture. 4. Coronary artery disease. Aortic Atherosclerosis (ICD10-I70.0) and Emphysema (ICD10-J43.9). Electronically Signed   By: Delanna Ahmadi M.D.   On: 09/06/2021 15:21   CT Renal Stone Study  Result Date: 09/06/2021 CLINICAL DATA:  Right flank pain, kidney stones suspected EXAM: CT ABDOMEN AND PELVIS WITHOUT CONTRAST TECHNIQUE: Multidetector CT imaging of the abdomen and pelvis was performed following the standard protocol without IV contrast. COMPARISON:  10/22/2006 FINDINGS: Lower chest: Please see separately reported examination of the chest Hepatobiliary: No solid liver abnormality is seen. No gallstones, gallbladder wall thickening, or biliary dilatation. Pancreas: Unremarkable. No pancreatic ductal dilatation or surrounding inflammatory changes. Spleen: Normal in size without significant abnormality. Adrenals/Urinary Tract: Benign adenomatous thickening of the adrenal glands. Multiple small nonobstructive calculi of the right kidney. Punctuate calculus of the midportion of the left kidney (series 4, image 110). No ureteral calculi or hydronephrosis. Bladder is unremarkable. Stomach/Bowel: Stomach is within normal limits. Appendix is not clearly visualized and may be surgically absent. No evidence of bowel wall thickening, distention, or inflammatory changes.  Vascular/Lymphatic: Aortic atherosclerosis. No enlarged abdominal or pelvic lymph nodes. Reproductive: No mass or other significant abnormality. Other: No abdominal wall hernia or abnormality. No abdominopelvic ascites. Musculoskeletal: No acute or significant osseous findings. IMPRESSION: 1. Multiple small nonobstructive calculi of the right kidney. Punctuate calculus of the midportion of the left kidney. 2.  No ureteral calculi or hydronephrosis. Aortic Atherosclerosis (ICD10-I70.0). Electronically Signed   By: Delanna Ahmadi M.D.   On: 09/06/2021 15:13    Procedures Procedures   Medications Ordered in ED Medications  ketorolac (TORADOL) 30 MG/ML injection 30 mg (30 mg Intravenous Given 09/06/21 1425)  iohexol (OMNIPAQUE) 350 MG/ML injection 75 mL (75 mLs Intravenous Contrast Given 09/06/21 1459)    ED Course  I have reviewed the triage vital signs and the nursing notes.  Pertinent labs & imaging results that were available during my care of the patient were reviewed by me and considered in my medical decision making (see chart for details).    MDM Rules/Calculators/A&P                          This is a 70 y.o. male who presents to the ED with right flank pain and right-sided pain started after a traumatic injury while playing golf about 1 week ago.  Patient with extensive cardiac history so initial work-up with chest pain labs done in triage.  Vitals stable  Patient is well appearing and in no acute distress. Exam with focal tenderness to the right lower rib cage on the anterior side of the chest.  Some right CVA tenderness also noted.  No generalized abdominal tenderness.  Pain is reproducible with movement.  Pulses are 2+ and equal bilateral radial and bilateral pedal.  No neurological symptoms on exam.  I discussed this case with Dr. Dondra Spry who recommends CT stone study and PE study.  Patient could have underlying rib fracture that was not detected on chest x-ray.  CT will examine for  this as well as Aorta for evidence of traumatic dissection or AAA.   I personally reviewed all laboratory work and imaging.  CBC is normal.  CMP normal.  Troponin negative.  Chest x-ray with minimal bibasilar subsegmental atelectasis.  EKG with normal sinus rhythm abnormal R wave transition.  Interventions: IV Toradol for pain  After reviewing previous history, current presentation, labs and imaging, low suspicion for ACS given negative troponins and EKG.  Labs have thus far been unrevealing.  I suspect that patient's presentation is due to a musculoskeletal injury, however imaging to r/o more serious pathology is still pending.  3:33 PM Care of DEZI SCHANER  transferred to [facility] and Dr. Billy Fischer at the end of my shift as the patient will require reassessment once labs/imaging have resulted. Patient presentation, ED course, and plan of care discussed with review of all pertinent labs and imaging. Please see his/her note for further details regarding further ED course and disposition. Plan at time of handoff is evaluate patient after CT imaging.  If negative, treat symptoms at home supportively. This may be altered or completely changed at the discretion of the oncoming team pending results of further workup.    Portions of this note were generated with [employer]. Dictation errors may occur despite best attempts at proofreading.   Final Clinical Impression(s) / ED Diagnoses Final diagnoses:  Flank pain  Chest pain on breathing    Rx / DC Orders ED Discharge Orders          Ordered    methocarbamol (ROBAXIN) 500 MG tablet  2 times daily        09/06/21 1533    lidocaine (LIDODERM) 5 %  Every 24 hours        09/06/21 1533             Narda Fundora, Adora Fridge, PA-C 09/06/21 Saxon, Buena, DO 09/07/21 [phone]

## 2021-09-06 NOTE — ED Provider Notes (Addendum)
Emergency Medicine Provider Triage Evaluation Note  Francisco Austin , a 70 y.o. male  was evaluated in triage.  Pt complains of right arm pain that radiates to the shoulder.  Happened about a week ago when he was cutting wood, worsened a few days ago when he was playing golf.  Patient is right-hand dominant, pain is exacerbated by movement..  Also endorses that the pain moves across his right chest and that he is having shortness of breath when it occurs.  Review of Systems  Positive: Right arm pain, right shoulder pain, right rib pain, shortness of breath, chest pain Negative: Numbness and tingling  Physical Exam  There were no vitals taken for this visit. Gen:   Awake, no distress   Resp:  Normal effort  MSK:   Decreased ROM to right upper extremity secondary to pain Other:  Radial pulse 2+, cap refill less than 2  Medical Decision Making  Medically screening exam initiated at 1:15 PM.  Appropriate orders placed.  Francisco Austin was informed that the remainder of the evaluation will be completed by another provider, this initial triage assessment does not replace that evaluation, and the importance of remaining in the ED until their evaluation is complete.  Suspect muscle strain, given extensive cardiac history will get chest pain work-up labs.   [name], PA-C 09/06/21 1318    [address] PA-C 09/06/21 1321    Lacretia Leigh, MD 09/07/21 [phone]

## 2021-09-23 DIAGNOSIS — I10 Essential (primary) hypertension: Secondary | ICD-10-CM | POA: Diagnosis not present

## 2021-09-23 DIAGNOSIS — E7800 Pure hypercholesterolemia, unspecified: Secondary | ICD-10-CM | POA: Diagnosis not present

## 2021-09-23 DIAGNOSIS — E1169 Type 2 diabetes mellitus with other specified complication: Secondary | ICD-10-CM | POA: Diagnosis not present

## 2021-09-23 DIAGNOSIS — M150 Primary generalized (osteo)arthritis: Secondary | ICD-10-CM | POA: Diagnosis not present

## 2021-09-23 DIAGNOSIS — I2510 Atherosclerotic heart disease of native coronary artery without angina pectoris: Secondary | ICD-10-CM | POA: Diagnosis not present

## 2021-09-23 DIAGNOSIS — K219 Gastro-esophageal reflux disease without esophagitis: Secondary | ICD-10-CM | POA: Diagnosis not present

## 2021-10-03 DIAGNOSIS — I10 Essential (primary) hypertension: Secondary | ICD-10-CM | POA: Diagnosis not present

## 2021-10-03 DIAGNOSIS — M150 Primary generalized (osteo)arthritis: Secondary | ICD-10-CM | POA: Diagnosis not present

## 2021-10-03 DIAGNOSIS — I2510 Atherosclerotic heart disease of native coronary artery without angina pectoris: Secondary | ICD-10-CM | POA: Diagnosis not present

## 2021-10-03 DIAGNOSIS — E1169 Type 2 diabetes mellitus with other specified complication: Secondary | ICD-10-CM | POA: Diagnosis not present

## 2021-10-03 DIAGNOSIS — G894 Chronic pain syndrome: Secondary | ICD-10-CM | POA: Diagnosis not present

## 2021-10-03 DIAGNOSIS — F17200 Nicotine dependence, unspecified, uncomplicated: Secondary | ICD-10-CM | POA: Diagnosis not present

## 2021-10-03 DIAGNOSIS — E7800 Pure hypercholesterolemia, unspecified: Secondary | ICD-10-CM | POA: Diagnosis not present

## 2021-10-03 DIAGNOSIS — K219 Gastro-esophageal reflux disease without esophagitis: Secondary | ICD-10-CM | POA: Diagnosis not present

## 2021-10-11 DIAGNOSIS — I10 Essential (primary) hypertension: Secondary | ICD-10-CM | POA: Diagnosis not present

## 2021-10-11 DIAGNOSIS — K219 Gastro-esophageal reflux disease without esophagitis: Secondary | ICD-10-CM | POA: Diagnosis not present

## 2021-10-11 DIAGNOSIS — I2510 Atherosclerotic heart disease of native coronary artery without angina pectoris: Secondary | ICD-10-CM | POA: Diagnosis not present

## 2021-10-11 DIAGNOSIS — E1169 Type 2 diabetes mellitus with other specified complication: Secondary | ICD-10-CM | POA: Diagnosis not present

## 2021-10-11 DIAGNOSIS — M150 Primary generalized (osteo)arthritis: Secondary | ICD-10-CM | POA: Diagnosis not present

## 2021-10-11 DIAGNOSIS — E7800 Pure hypercholesterolemia, unspecified: Secondary | ICD-10-CM | POA: Diagnosis not present

## 2021-11-03 DIAGNOSIS — R590 Localized enlarged lymph nodes: Secondary | ICD-10-CM

## 2021-11-03 HISTORY — DX: Localized enlarged lymph nodes: R59.0

## 2021-11-26 DIAGNOSIS — I10 Essential (primary) hypertension: Secondary | ICD-10-CM | POA: Diagnosis not present

## 2021-11-26 DIAGNOSIS — E7800 Pure hypercholesterolemia, unspecified: Secondary | ICD-10-CM | POA: Diagnosis not present

## 2021-11-26 DIAGNOSIS — K219 Gastro-esophageal reflux disease without esophagitis: Secondary | ICD-10-CM | POA: Diagnosis not present

## 2021-11-26 DIAGNOSIS — M150 Primary generalized (osteo)arthritis: Secondary | ICD-10-CM | POA: Diagnosis not present

## 2021-11-26 DIAGNOSIS — E1169 Type 2 diabetes mellitus with other specified complication: Secondary | ICD-10-CM | POA: Diagnosis not present

## 2021-12-03 ENCOUNTER — Encounter: Payer: Self-pay | Admitting: Cardiovascular Disease

## 2021-12-03 ENCOUNTER — Other Ambulatory Visit: Payer: Self-pay

## 2021-12-03 ENCOUNTER — Ambulatory Visit (INDEPENDENT_AMBULATORY_CARE_PROVIDER_SITE_OTHER): Payer: Medicare Other | Admitting: Cardiovascular Disease

## 2021-12-03 VITALS — BP 126/68 | HR 71 | Ht 71.0 in | Wt 208.0 lb

## 2021-12-03 DIAGNOSIS — R072 Precordial pain: Secondary | ICD-10-CM | POA: Diagnosis not present

## 2021-12-03 DIAGNOSIS — E782 Mixed hyperlipidemia: Secondary | ICD-10-CM

## 2021-12-03 DIAGNOSIS — I10 Essential (primary) hypertension: Secondary | ICD-10-CM

## 2021-12-03 DIAGNOSIS — I25119 Atherosclerotic heart disease of native coronary artery with unspecified angina pectoris: Secondary | ICD-10-CM

## 2021-12-03 MED ORDER — METOPROLOL TARTRATE 100 MG PO TABS: 100.0000 mg | ORAL_TABLET | Freq: Once | ORAL | 0 refills | Status: DC

## 2021-12-03 NOTE — Patient Instructions (Addendum)
Medication Instructions:  Your physician recommends that you continue on your current medications as directed. Please refer to the Current Medication list given to you today.  *If you need a refill on your cardiac medications before your next appointment, please call your pharmacy*   Lab Work: Your physician recommends that you have labs drawn today: lipid/liver profile  Your physician recommends that you return for lab work in: within 7 days of your coronary CTA   If you have labs (blood work) drawn today and your tests are completely normal, you will receive your results only by: [place] (if you have MyChart) OR A paper copy in the mail If you have any lab test that is abnormal or we need to change your treatment, we will call you to review the results.   Testing/Procedures: See below   Follow-Up: At [facility], you and your health needs are our priority.  As part of our continuing mission to provide you with exceptional heart care, we have created designated Provider Care Teams.  These Care Teams include your primary Cardiologist (physician) and Advanced Practice Providers (APPs -  Physician Assistants and Nurse Practitioners) who all work together to provide you with the care you need, when you need it.  We recommend signing up for the patient portal called "MyChart".  Sign up information is provided on this After Visit Summary.  MyChart is used to connect with patients for Virtual Visits (Telemedicine).  Patients are able to view lab/test results, encounter notes, upcoming appointments, etc.  Non-urgent messages can be sent to your provider as well.   To learn more about what you can do with MyChart, go to [email or web address].    Your next appointment:   12 month(s)  The format for your next appointment:   In Person  Provider:   Quay Burow, MD   Other Instructions   Your cardiac CT will be scheduled at the below location:   [facility] [address] [place], [place] [address] [phone]   If scheduled at [facility], please arrive at the [facility] main entrance (entrance A) of [facility] 30 minutes prior to test start time. You can use the FREE valet parking offered at the main entrance (encouraged to control the heart rate for the test) Proceed to the [facility] Radiology Department (first floor) to check-in and test prep.  If scheduled at [facility], please arrive 15 mins early for check-in and test prep.  Please follow these instructions carefully (unless otherwise directed):  Hold all erectile dysfunction medications at least 3 days (72 hrs) prior to test.  On the Night Before the Test: Be sure to Drink plenty of water. Do not consume any caffeinated/decaffeinated beverages or chocolate 12 hours prior to your test. Do not take any antihistamines 12 hours prior to your test.   On the Day of the Test: Drink plenty of water until 1 hour prior to the test. Do not eat any food 4 hours prior to the test. You may take your regular medications prior to the test.  Take metoprolol (Lopressor)100mg  two hours prior to test. HOLD Furosemide/Hydrochlorothiazide morning of the test. FEMALES- please wear underwire-free bra if available, avoid dresses & tight clothing      After the Test: Drink plenty of water. After receiving IV contrast, you may experience a mild flushed feeling. This is normal. On occasion, you may experience a mild rash up to 24 hours after the test. This  is not dangerous. If this occurs, you can take Benadryl 25 mg and increase your fluid intake. If you experience trouble breathing, this can be serious. If it is severe call 911 IMMEDIATELY. If it is mild, please call our office. If you take any of these medications: Glipizide/Metformin, Avandament, Glucavance, please do not take 48 hours after completing test unless otherwise instructed.  We will call to  schedule your test 2-4 weeks out understanding that some insurance companies will need an authorization prior to the service being performed.   For non-scheduling related questions, please contact the cardiac imaging nurse navigator should you have any questions/concerns: Marchia Bond, Cardiac Imaging Nurse Navigator Gordy Clement, Cardiac Imaging Nurse Navigator [facility] Direct Office Dial: [phone]   For scheduling needs, including cancellations and rescheduling, please call [place], [phone].

## 2021-12-03 NOTE — Assessment & Plan Note (Signed)
History of essential hypertension a blood pressure measured today at 126/68.  He is on diltiazem and Micardis.

## 2021-12-03 NOTE — Assessment & Plan Note (Signed)
History of CAD status post PCI by Dr. [name] remotely but no stents placed.  He does get fairly frequent chest pain lasting from seconds to minutes which he says is relieved with sublingual nitroglycerin.  I am going to get a coronary CTA to further evaluate.

## 2021-12-03 NOTE — Progress Notes (Addendum)
12/03/2021 Francisco Austin   07-20-1951  [id number]  Primary Physician [name], MD Primary Cardiologist: Lorretta Harp MD [name], [name]  HPI:  Francisco Austin is a 71 y.o.  father of 2 children, with no grandchildren, former patient of Dr. Thurman Coyer, who was referred to me by Dr. [name] to be established in my practice because of known coronary artery disease.  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  I last saw him in the office 11/30/2020. He does have treated hypertension hyperlipidemia.  His mother Francisco Austin who died in 12-Jan-2017 was my patient as well.  He is never had a heart attack or stroke.  He is had several heart catheterizations Dr. [name] remotely with one PCI but no stent.  He does get frequent atypical chest pain.  His last 2D echo performed 04/06/2017 was essentially normal.   Since I saw him a year ago he continues to have atypical chest pain which has not changed in frequency or severity.  Does also have spinal stenosis and has difficulty ambulating.  He continues to smoke a pipe.   Current Meds  Medication Sig   aspirin EC 81 MG tablet Take 81 mg by mouth daily.   atorvastatin (LIPITOR) 40 MG tablet atorvastatin 40 mg tablet   Cholecalciferol 50 MCG (2000 UT) TABS Take 1 tablet by mouth daily.   diclofenac sodium (VOLTAREN) 1 % GEL Apply 1 g topically as needed.   diltiazem (CARDIZEM CD) 240 MG 24 hr capsule diltiazem CD 240 mg capsule,extended release 24 hr   esomeprazole (NEXIUM) 40 MG capsule Take by mouth.   HYDROcodone-acetaminophen (NORCO) 10-325 MG tablet hydrocodone 10 mg-acetaminophen 325 mg tablet   LORazepam (ATIVAN) 1 MG tablet lorazepam 1 mg tablet   nitroGLYCERIN (NITROSTAT) 0.4 MG SL tablet nitroglycerin 0.4 mg sublingual tablet   telmisartan (MICARDIS) 80 MG tablet telmisartan 80 mg tablet   umeclidinium-vilanterol (ANORO ELLIPTA) 62.5-25 MCG/INH AEPB Anoro Ellipta 62.5 mcg-25 mcg/actuation powder for inhalation    vitamin B-12 (CYANOCOBALAMIN) 500 MCG tablet Take 1 tablet by mouth daily.     Allergies  Allergen Reactions   Gabapentin Anaphylaxis and Other (See Comments)    B/P dropped to "40"    Morphine Other (See Comments)   Other Anaphylaxis    Androgenic Anabolic Steroid   Prednisone Anaphylaxis   Alprazolam     Other reaction(s): weirds him out   Niacin    Penicillamine Hives    Social History   Socioeconomic History   Marital status: Married    Spouse name: Not on file   Number of children: Not on file   Years of education: Not on file   Highest education level: Not on file  Occupational History   Not on file  Tobacco Use   Smoking status: Every Day    Types: Pipe   Smokeless tobacco: Never  Substance and Sexual Activity   Alcohol use: Not on file   Drug use: Not on file   Sexual activity: Not on file  Other Topics Concern   Not on file  Social History Narrative   Not on file   Social Determinants of Health   Financial Resource Strain: Not on file  Food Insecurity: Not on file  Transportation Needs: Not on file  Physical Activity: Not on file  Stress: Not on file  Social Connections: Not on file  Intimate Partner Violence: Not on file     Review  of Systems: General: negative for chills, fever, night sweats or weight changes.  Cardiovascular: negative for chest pain, dyspnea on exertion, edema, orthopnea, palpitations, paroxysmal nocturnal dyspnea or shortness of breath Dermatological: negative for rash Respiratory: negative for cough or wheezing Urologic: negative for hematuria Abdominal: negative for nausea, vomiting, diarrhea, bright red blood per rectum, melena, or hematemesis Neurologic: negative for visual changes, syncope, or dizziness All other systems reviewed and are otherwise negative except as noted above.    Blood pressure 126/68, pulse 71, height 5\' 11"  (1.803 m), weight 208 lb (94.3 kg), SpO2 95 %.  General appearance: alert and no  distress Neck: no adenopathy, no carotid bruit, no JVD, supple, symmetrical, trachea midline, and thyroid not enlarged, symmetric, no tenderness/mass/nodules Lungs: clear to auscultation bilaterally Heart: regular rate and rhythm, S1, S2 normal, no murmur, click, rub or gallop Extremities: extremities normal, atraumatic, no cyanosis or edema Pulses: 2+ and symmetric Skin: Skin color, texture, turgor normal. No rashes or lesions Neurologic: Grossly normal  EKG sinus rhythm at 71 with nonspecific ST and T wave changes.  Personally reviewed this EKG.  ASSESSMENT AND PLAN:   Essential hypertension History of essential hypertension a blood pressure measured today at 126/68.  He is on diltiazem and Micardis.  Hyperlipidemia History of hyperlipidemia on atorvastatin 40 mg a day with lipid profile performed 07/25/2020 revealing total cholesterol 138, LDL 77 and HDL 39.  We will recheck a lipid liver profile today.  Coronary artery disease History of CAD status post PCI by Dr. [name] remotely but no stents placed.  He does get fairly frequent chest pain lasting from seconds to minutes which he says is relieved with sublingual nitroglycerin.  I am going to get a coronary CTA to further evaluate.     Lorretta Harp MD FACP,FACC,FAHA, [facility] 12/03/2021 8:48 AM   Addendum: [name] returns today to follow-up his coronary CTA performed 12/16/2021 revealing a coronary calcium score 1970 with what appears to be highly calcified significant stenosis in the proximal RCA.  Based on this, and his symptoms we have decided to proceed with outpatient diagnostic coronary angiography.   I have reviewed the risks, indications, and alternatives to cardiac catheterization, possible angioplasty, and stenting with the patient. Risks include but are not limited to bleeding, infection, vascular injury, stroke, myocardial infection, arrhythmia, kidney injury, radiation-related injury in the case of prolonged  fluoroscopy use, emergency cardiac surgery, and death. The patient understands the risks of serious complication is 1-2 in 5462 with diagnostic cardiac cath and 1-2% or less with angioplasty/stenting.     Lorretta Harp, M.D., [place], [facility], [place] [address] [place], [place]  [address]  [phone] 12/18/2021 12:02 PM

## 2021-12-03 NOTE — Assessment & Plan Note (Signed)
History of hyperlipidemia on atorvastatin 40 mg a day with lipid profile performed 07/25/2020 revealing total cholesterol 138, LDL 77 and HDL 39.  We will recheck a lipid liver profile today.

## 2021-12-04 ENCOUNTER — Other Ambulatory Visit: Payer: Self-pay | Admitting: *Deleted

## 2021-12-04 DIAGNOSIS — E782 Mixed hyperlipidemia: Secondary | ICD-10-CM

## 2021-12-04 MED ORDER — ATORVASTATIN CALCIUM 80 MG PO TABS: 80.0000 mg | ORAL_TABLET | Freq: Every day | ORAL | 3 refills | Status: DC

## 2021-12-06 LAB — LIPID PANEL
Chol/HDL Ratio: 4.1 ratio (ref 0.0–5.0)
Cholesterol, Total: 146 mg/dL (ref 100–199)
HDL: 36 mg/dL — ABNORMAL LOW (ref >39)
LDL Chol Calc (NIH): 90 mg/dL (ref 0–99)
Triglycerides: 111 mg/dL (ref 0–149)
VLDL Cholesterol Cal: 20 mg/dL (ref 5–40)

## 2021-12-06 LAB — HEPATIC FUNCTION PANEL
ALT: 20 IU/L (ref 0–44)
AST: 28 IU/L (ref 0–40)
Albumin: 4.5 g/dL (ref 3.8–4.8)
Alkaline Phosphatase: 106 IU/L (ref 44–121)
Bilirubin Total: 0.4 mg/dL (ref 0.0–1.2)
Bilirubin, Direct: <0.1 mg/dL (ref 0.00–0.40)
Total Protein: 7.3 g/dL (ref 6.0–8.5)

## 2021-12-12 ENCOUNTER — Telehealth (HOSPITAL_COMMUNITY): Payer: Self-pay | Admitting: Emergency Medicine

## 2021-12-12 NOTE — Telephone Encounter (Signed)
Reaching out to patient to offer assistance regarding upcoming cardiac imaging study; pt verbalizes understanding of appt date/time, parking situation and where to check in, pre-test NPO status and medications ordered, and verified current allergies; name and call back number provided for further questions should they arise Marchia Bond RN Navigator Cardiac Imaging [facility] [phone] office [phone] cell  Denies iv issues 100mg  metoprolol tartrate holding telmisartan Arrival 930

## 2021-12-13 DIAGNOSIS — E782 Mixed hyperlipidemia: Secondary | ICD-10-CM | POA: Diagnosis not present

## 2021-12-13 DIAGNOSIS — R072 Precordial pain: Secondary | ICD-10-CM | POA: Diagnosis not present

## 2021-12-13 DIAGNOSIS — I25119 Atherosclerotic heart disease of native coronary artery with unspecified angina pectoris: Secondary | ICD-10-CM | POA: Diagnosis not present

## 2021-12-13 DIAGNOSIS — I10 Essential (primary) hypertension: Secondary | ICD-10-CM | POA: Diagnosis not present

## 2021-12-13 LAB — BASIC METABOLIC PANEL
BUN/Creatinine Ratio: 13 (ref 10–24)
BUN: 9 mg/dL (ref 8–27)
CO2: 21 mmol/L (ref 20–29)
Calcium: 9.1 mg/dL (ref 8.6–10.2)
Chloride: 103 mmol/L (ref 96–106)
Creatinine, Ser: 0.71 mg/dL — ABNORMAL LOW (ref 0.76–1.27)
Glucose: 100 mg/dL — ABNORMAL HIGH (ref 70–99)
Potassium: 4.5 mmol/L (ref 3.5–5.2)
Sodium: 140 mmol/L (ref 134–144)
eGFR: 99 mL/min/{1.73_m2} (ref >59)

## 2021-12-16 ENCOUNTER — Ambulatory Visit (HOSPITAL_COMMUNITY)
Admission: RE | Admit: 2021-12-16 | Discharge: 2021-12-16 | Disposition: A | Discharge status: Home / Self Care | Payer: Medicare Other | Source: Ambulatory Visit | Attending: Cardiovascular Disease | Admitting: Cardiovascular Disease

## 2021-12-16 ENCOUNTER — Other Ambulatory Visit: Payer: Self-pay

## 2021-12-16 ENCOUNTER — Telehealth: Payer: Self-pay | Admitting: Cardiovascular Disease

## 2021-12-16 ENCOUNTER — Encounter (HOSPITAL_COMMUNITY): Payer: Self-pay

## 2021-12-16 ENCOUNTER — Other Ambulatory Visit: Payer: Self-pay | Admitting: Internal Medicine

## 2021-12-16 DIAGNOSIS — R931 Abnormal findings on diagnostic imaging of heart and coronary circulation: Secondary | ICD-10-CM | POA: Insufficient documentation

## 2021-12-16 DIAGNOSIS — R072 Precordial pain: Secondary | ICD-10-CM | POA: Diagnosis not present

## 2021-12-16 MED ORDER — NITROGLYCERIN 0.4 MG SL SUBL
SUBLINGUAL_TABLET | SUBLINGUAL | Status: AC
  Filled 2021-12-16: qty 2

## 2021-12-16 MED ORDER — SODIUM CHLORIDE 0.9 % IV SOLN
500.0000 mL | Freq: Once | INTRAVENOUS | Status: AC
  Administered 2021-12-16: 500 mL via INTRAVENOUS

## 2021-12-16 MED ORDER — IOHEXOL 350 MG/ML SOLN
95.0000 mL | Freq: Once | INTRAVENOUS | Status: AC | PRN
  Administered 2021-12-16: 95 mL via INTRAVENOUS

## 2021-12-16 MED ORDER — NITROGLYCERIN 0.4 MG SL SUBL
0.8000 mg | SUBLINGUAL_TABLET | Freq: Once | SUBLINGUAL | Status: AC
  Administered 2021-12-16: 0.8 mg via SUBLINGUAL

## 2021-12-16 NOTE — Telephone Encounter (Signed)
RN will reached out to the CT nurse navigators for  response

## 2021-12-16 NOTE — Telephone Encounter (Signed)
Francisco Austin with [facility] Radiology would like to confirm patient's CT results from this morning have been received. She states it was faxed to the office around 10:00 AM. Please advise.

## 2021-12-16 NOTE — Progress Notes (Unsigned)
Please send for FFR - Dr. Elice Crigger 

## 2021-12-16 NOTE — Telephone Encounter (Signed)
Spoke to patient . Per  Cardiac CTA nurse navigator -  appointments are randomly in [place] - when FFR is ordered.  Per navigator _[name] is aware that he does not need to go  radiology  dept for 12/17/21 .

## 2021-12-16 NOTE — Telephone Encounter (Signed)
Called back to confirm that we did receive images for pt today. Francisco Austin will mark that we have received.

## 2021-12-16 NOTE — Telephone Encounter (Signed)
New Message:   Patient said he had a CT test today(12-16-21). He wants to know why he is scheduled again tomorrow(12-17-21) for another one. He said he did not know anything about the one scheduled for 12-17-21.

## 2021-12-17 ENCOUNTER — Ambulatory Visit (HOSPITAL_COMMUNITY)
Admission: RE | Admit: 2021-12-17 | Discharge: 2021-12-17 | Disposition: A | Discharge status: Home / Self Care | Payer: Medicare Other | Source: Ambulatory Visit | Attending: Internal Medicine | Admitting: Internal Medicine

## 2021-12-17 DIAGNOSIS — R931 Abnormal findings on diagnostic imaging of heart and coronary circulation: Secondary | ICD-10-CM | POA: Diagnosis not present

## 2021-12-17 DIAGNOSIS — R072 Precordial pain: Secondary | ICD-10-CM | POA: Diagnosis not present

## 2021-12-18 ENCOUNTER — Other Ambulatory Visit: Payer: Self-pay

## 2021-12-18 ENCOUNTER — Encounter: Payer: Self-pay | Admitting: Cardiovascular Disease

## 2021-12-18 ENCOUNTER — Ambulatory Visit (INDEPENDENT_AMBULATORY_CARE_PROVIDER_SITE_OTHER): Payer: Medicare Other | Admitting: Cardiovascular Disease

## 2021-12-18 VITALS — BP 98/52 | HR 54 | Ht 71.0 in | Wt 206.8 lb

## 2021-12-18 DIAGNOSIS — E782 Mixed hyperlipidemia: Secondary | ICD-10-CM

## 2021-12-18 DIAGNOSIS — R931 Abnormal findings on diagnostic imaging of heart and coronary circulation: Secondary | ICD-10-CM | POA: Diagnosis not present

## 2021-12-18 MED ORDER — SODIUM CHLORIDE 0.9% FLUSH: 3.0000 mL | Freq: Two times a day (BID) | INTRAVENOUS | Status: DC

## 2021-12-18 NOTE — Progress Notes (Signed)
See addendum attached to note dated 12/03/2021.  Patient is seen today for follow-up of his coronary CTA performed 2 days ago suggesting a high-grade calcified lesion in his proximal RCA.  Based on this, we have elected to proceed with outpatient diagnostic coronary angiography.  It should be noted that he did have an incidental finding of a new left hilar lymph node which will need to be followed up by his primary care physician, Dr. [name], after his coronary procedure.  Lorretta Harp, M.D., [place], [facility], [place] [address] [place], [place]  [address]  [phone] 12/18/2021 12:03 PM

## 2021-12-18 NOTE — Patient Instructions (Signed)
Medication Instructions:  Your physician recommends that you continue on your current medications as directed. Please refer to the Current Medication list given to you today.  *If you need a refill on your cardiac medications before your next appointment, please call your pharmacy*   Lab Work: Your physician recommends that you have labs drawn today: CBC  If you have labs (blood work) drawn today and your tests are completely normal, you will receive your results only by: [place] (if you have MyChart) OR A paper copy in the mail If you have any lab test that is abnormal or we need to change your treatment, we will call you to review the results.   Follow-Up: At [facility], you and your health needs are our priority.  As part of our continuing mission to provide you with exceptional heart care, we have created designated Provider Care Teams.  These Care Teams include your primary Cardiologist (physician) and Advanced Practice Providers (APPs -  Physician Assistants and Nurse Practitioners) who all work together to provide you with the care you need, when you need it.  We recommend signing up for the patient portal called "MyChart".  Sign up information is provided on this After Visit Summary.  MyChart is used to connect with patients for Virtual Visits (Telemedicine).  Patients are able to view lab/test results, encounter notes, upcoming appointments, etc.  Non-urgent messages can be sent to your provider as well.   To learn more about what you can do with MyChart, go to [email or web address].    Your next appointment:   2-3 week(s) after procedure  The format for your next appointment:   In Person  Provider:   Quay Burow, MD   Other Instructions  [place] [address] Dept: [phone] Loc: [place]  12/18/2021  You are  scheduled for a Cardiac Catheterization on Monday, February 20 with Dr. Quay Burow.  1. Please arrive at the [facility] (Main Entrance A) at [facility]: [address] [place], [place] [address] at 11:00 AM (This time is two hours before your procedure to ensure your preparation). Free valet parking service is available.   Special note: Every effort is made to have your procedure done on time. Please understand that emergencies sometimes delay scheduled procedures.  2. Diet: Do not eat solid foods after midnight.  The patient may have clear liquids until 5am upon the day of the procedure.  3. Labs: You will need to have blood drawn today.  4. Medication instructions in preparation for your procedure:    On the morning of your procedure, take your Aspirin and any morning medicines NOT listed above.  You may use sips of water.  5. Plan for one night stay--bring personal belongings. 6. Bring a current list of your medications and current insurance cards. 7. You MUST have a responsible person to drive you home. 8. Someone MUST be with you the first 24 hours after you arrive home or your discharge will be delayed. 9. Please wear clothes that are easy to get on and off and wear slip-on shoes.  Thank you for allowing [place] to care for you!   -- [place] [facility]

## 2021-12-18 NOTE — H&P (View-Only) (Signed)
See addendum attached to note dated 12/03/2021.  Patient is seen today for follow-up of his coronary CTA performed 2 days ago suggesting a high-grade calcified lesion in his proximal RCA.  Based on this, we have elected to proceed with outpatient diagnostic coronary angiography.  It should be noted that he did have an incidental finding of a new left hilar lymph node which will need to be followed up by his primary care physician, Dr. [name], after his coronary procedure.  Lorretta Harp, M.D., [place], [facility], [place] [address] [place], [place]  [address]  [phone] 12/18/2021 12:03 PM

## 2021-12-19 ENCOUNTER — Telehealth: Payer: Self-pay | Admitting: *Deleted

## 2021-12-19 LAB — CBC
Hematocrit: 45.3 % (ref 37.5–51.0)
Hemoglobin: 15.2 g/dL (ref 13.0–17.7)
MCH: 30.7 pg (ref 26.6–33.0)
MCHC: 33.6 g/dL (ref 31.5–35.7)
MCV: 92 fL (ref 79–97)
Platelets: 371 10*3/uL (ref 150–450)
RBC: 4.95 x10E6/uL (ref 4.14–5.80)
RDW: 13.1 % (ref 11.6–15.4)
WBC: 8.1 10*3/uL (ref 3.4–10.8)

## 2021-12-19 NOTE — Telephone Encounter (Signed)
Cardiac catheterization scheduled at [facility] for: Monday December 23, 2021 1:30 PM [facility] Main Entrance A [facility]) at: 11:30 AM   Diet-no solid food after midnight prior to cath, clear liquids until 5 AM day of procedure.  Medication instructions for procedure: -Usual morning medications can be taken pre-cath with sips of water including aspirin 81 mg.    Must have responsible adult to drive home post procedure and be with patient first 24 hours after arriving home.  [facility] does allow one visitor to wait in the waiting room during the time you are there.   Patient reports does not currently have any new symptoms concerning for COVID-19 and no household members with COVID-19 like illness.    Reviewed procedure instructions with patient.

## 2021-12-23 ENCOUNTER — Encounter (HOSPITAL_COMMUNITY)
Admission: AD | Disposition: A | Discharge status: Home / Self Care | Payer: Self-pay | Source: Ambulatory Visit | Attending: Cardiovascular Disease

## 2021-12-23 ENCOUNTER — Other Ambulatory Visit: Payer: Self-pay

## 2021-12-23 ENCOUNTER — Inpatient Hospital Stay (HOSPITAL_COMMUNITY)
Admission: AD | Admit: 2021-12-23 | Discharge: 2021-12-25 | DRG: 247 | Disposition: A | Discharge status: Home / Self Care | Payer: Medicare Other | Source: Ambulatory Visit | Attending: Cardiovascular Disease | Admitting: Cardiovascular Disease

## 2021-12-23 ENCOUNTER — Encounter (HOSPITAL_COMMUNITY): Payer: Self-pay | Admitting: Cardiovascular Disease

## 2021-12-23 DIAGNOSIS — I2584 Coronary atherosclerosis due to calcified coronary lesion: Secondary | ICD-10-CM | POA: Diagnosis not present

## 2021-12-23 DIAGNOSIS — I2510 Atherosclerotic heart disease of native coronary artery without angina pectoris: Secondary | ICD-10-CM | POA: Diagnosis not present

## 2021-12-23 DIAGNOSIS — Z20822 Contact with and (suspected) exposure to covid-19: Secondary | ICD-10-CM | POA: Diagnosis not present

## 2021-12-23 DIAGNOSIS — I25119 Atherosclerotic heart disease of native coronary artery with unspecified angina pectoris: Secondary | ICD-10-CM

## 2021-12-23 DIAGNOSIS — E7800 Pure hypercholesterolemia, unspecified: Secondary | ICD-10-CM | POA: Diagnosis not present

## 2021-12-23 DIAGNOSIS — I10 Essential (primary) hypertension: Secondary | ICD-10-CM | POA: Diagnosis not present

## 2021-12-23 DIAGNOSIS — E785 Hyperlipidemia, unspecified: Secondary | ICD-10-CM | POA: Diagnosis present

## 2021-12-23 DIAGNOSIS — E1169 Type 2 diabetes mellitus with other specified complication: Secondary | ICD-10-CM | POA: Diagnosis not present

## 2021-12-23 DIAGNOSIS — I25118 Atherosclerotic heart disease of native coronary artery with other forms of angina pectoris: Secondary | ICD-10-CM | POA: Diagnosis not present

## 2021-12-23 DIAGNOSIS — I959 Hypotension, unspecified: Secondary | ICD-10-CM | POA: Diagnosis not present

## 2021-12-23 DIAGNOSIS — R931 Abnormal findings on diagnostic imaging of heart and coronary circulation: Secondary | ICD-10-CM | POA: Diagnosis not present

## 2021-12-23 DIAGNOSIS — Z955 Presence of coronary angioplasty implant and graft: Secondary | ICD-10-CM

## 2021-12-23 HISTORY — PX: LEFT HEART CATH AND CORONARY ANGIOGRAPHY: CATH118249

## 2021-12-23 HISTORY — DX: Hyperlipidemia, unspecified: E78.5

## 2021-12-23 HISTORY — DX: Essential (primary) hypertension: I10

## 2021-12-23 HISTORY — DX: Atherosclerotic heart disease of native coronary artery without angina pectoris: I25.10

## 2021-12-23 HISTORY — DX: Dorsalgia, unspecified: M54.9

## 2021-12-23 LAB — CBC
HCT: 42.8 % (ref 39.0–52.0)
Hemoglobin: 13.8 g/dL (ref 13.0–17.0)
MCH: 30.7 pg (ref 26.0–34.0)
MCHC: 32.2 g/dL (ref 30.0–36.0)
MCV: 95.3 fL (ref 80.0–100.0)
Platelets: 311 10*3/uL (ref 150–400)
RBC: 4.49 MIL/uL (ref 4.22–5.81)
RDW: 14 % (ref 11.5–15.5)
WBC: 7.7 10*3/uL (ref 4.0–10.5)
nRBC: 0 % (ref 0.0–0.2)

## 2021-12-23 LAB — BASIC METABOLIC PANEL
Anion gap: 9 (ref 5–15)
BUN: 8 mg/dL (ref 8–23)
CO2: 22 mmol/L (ref 22–32)
Calcium: 8.5 mg/dL — ABNORMAL LOW (ref 8.9–10.3)
Chloride: 102 mmol/L (ref 98–111)
Creatinine, Ser: 0.8 mg/dL (ref 0.61–1.24)
GFR, Estimated: >60 mL/min (ref >60)
Glucose, Bld: 159 mg/dL — ABNORMAL HIGH (ref 70–99)
Potassium: 3.5 mmol/L (ref 3.5–5.1)
Sodium: 133 mmol/L — ABNORMAL LOW (ref 135–145)

## 2021-12-23 LAB — SARS CORONAVIRUS 2 BY RT PCR (HOSPITAL ORDER, PERFORMED IN ~~LOC~~ HOSPITAL LAB): SARS Coronavirus 2: NEGATIVE

## 2021-12-23 SURGERY — LEFT HEART CATH AND CORONARY ANGIOGRAPHY
Anesthesia: LOCAL

## 2021-12-23 MED ORDER — ASPIRIN EC 81 MG PO TBEC: 81.0000 mg | DELAYED_RELEASE_TABLET | Freq: Every day | ORAL | Status: DC

## 2021-12-23 MED ORDER — ASPIRIN 81 MG PO CHEW
81.0000 mg | CHEWABLE_TABLET | Freq: Every day | ORAL | Status: DC
  Administered 2021-12-24 – 2021-12-25 (×2): 81 mg via ORAL
  Filled 2021-12-23 (×2): qty 1

## 2021-12-23 MED ORDER — CLOPIDOGREL BISULFATE 300 MG PO TABS
ORAL_TABLET | ORAL | Status: DC | PRN
  Administered 2021-12-23: 600 mg via ORAL

## 2021-12-23 MED ORDER — ATORVASTATIN CALCIUM 80 MG PO TABS
80.0000 mg | ORAL_TABLET | Freq: Every day | ORAL | Status: DC
  Administered 2021-12-24 – 2021-12-25 (×2): 80 mg via ORAL
  Filled 2021-12-23 (×2): qty 1

## 2021-12-23 MED ORDER — SODIUM CHLORIDE 0.9% FLUSH: 3.0000 mL | INTRAVENOUS | Status: DC | PRN

## 2021-12-23 MED ORDER — HEPARIN (PORCINE) IN NACL 1000-0.9 UT/500ML-% IV SOLN
INTRAVENOUS | Status: AC
  Filled 2021-12-23: qty 1000

## 2021-12-23 MED ORDER — ACETAMINOPHEN 325 MG PO TABS: 650.0000 mg | ORAL_TABLET | ORAL | Status: DC | PRN

## 2021-12-23 MED ORDER — NITROGLYCERIN 0.4 MG SL SUBL
SUBLINGUAL_TABLET | SUBLINGUAL | Status: AC
  Administered 2021-12-23: 0.4 mg
  Filled 2021-12-23: qty 2

## 2021-12-23 MED ORDER — SODIUM CHLORIDE 0.9 % IV SOLN: 250.0000 mL | INTRAVENOUS | Status: DC | PRN

## 2021-12-23 MED ORDER — CLOPIDOGREL BISULFATE 300 MG PO TABS
ORAL_TABLET | ORAL | Status: AC
  Filled 2021-12-23: qty 2

## 2021-12-23 MED ORDER — IOHEXOL 350 MG/ML SOLN
INTRAVENOUS | Status: DC | PRN
  Administered 2021-12-23: 80 mL

## 2021-12-23 MED ORDER — ALBUTEROL SULFATE HFA 108 (90 BASE) MCG/ACT IN AERS: 2.0000 | INHALATION_SPRAY | Freq: Four times a day (QID) | RESPIRATORY_TRACT | Status: DC | PRN

## 2021-12-23 MED ORDER — SODIUM CHLORIDE 0.9 % WEIGHT BASED INFUSION
3.0000 mL/kg/h | INTRAVENOUS | Status: DC
  Administered 2021-12-23: 3 mL/kg/h via INTRAVENOUS

## 2021-12-23 MED ORDER — MIDAZOLAM HCL 2 MG/2ML IJ SOLN
INTRAMUSCULAR | Status: DC | PRN
Start: 2021-12-23 — End: 2021-12-23
  Administered 2021-12-23: 1 mg via INTRAVENOUS

## 2021-12-23 MED ORDER — VERAPAMIL HCL 2.5 MG/ML IV SOLN
INTRAVENOUS | Status: AC
  Filled 2021-12-23: qty 2

## 2021-12-23 MED ORDER — CLOPIDOGREL BISULFATE 75 MG PO TABS
75.0000 mg | ORAL_TABLET | ORAL | Status: AC
  Administered 2021-12-24: 75 mg via ORAL
  Filled 2021-12-23: qty 1

## 2021-12-23 MED ORDER — FENTANYL CITRATE (PF) 100 MCG/2ML IJ SOLN
INTRAMUSCULAR | Status: AC
  Filled 2021-12-23: qty 2

## 2021-12-23 MED ORDER — LABETALOL HCL 5 MG/ML IV SOLN: 10.0000 mg | INTRAVENOUS | Status: AC | PRN

## 2021-12-23 MED ORDER — SODIUM CHLORIDE 0.9 % IV SOLN: INTRAVENOUS | Status: AC

## 2021-12-23 MED ORDER — ONDANSETRON HCL 4 MG/2ML IJ SOLN: 4.0000 mg | Freq: Four times a day (QID) | INTRAMUSCULAR | Status: DC | PRN

## 2021-12-23 MED ORDER — FAMOTIDINE IN NACL 20-0.9 MG/50ML-% IV SOLN
INTRAVENOUS | Status: AC
  Filled 2021-12-23: qty 50

## 2021-12-23 MED ORDER — NITROGLYCERIN 1 MG/10 ML FOR IR/CATH LAB
INTRA_ARTERIAL | Status: AC
Start: 2021-12-23 — End: ?
  Filled 2021-12-23: qty 10

## 2021-12-23 MED ORDER — SODIUM CHLORIDE 0.9 % WEIGHT BASED INFUSION
1.0000 mL/kg/h | INTRAVENOUS | Status: DC
  Administered 2021-12-24: 1 mL/kg/h via INTRAVENOUS

## 2021-12-23 MED ORDER — HEPARIN SODIUM (PORCINE) 1000 UNIT/ML IJ SOLN
INTRAMUSCULAR | Status: DC | PRN
  Administered 2021-12-23: 4500 [IU] via INTRAVENOUS

## 2021-12-23 MED ORDER — DILTIAZEM HCL ER BEADS 240 MG PO CP24
240.0000 mg | ORAL_CAPSULE | Freq: Every day | ORAL | Status: DC
  Filled 2021-12-23: qty 1

## 2021-12-23 MED ORDER — ALBUTEROL SULFATE (2.5 MG/3ML) 0.083% IN NEBU: 2.5000 mg | INHALATION_SOLUTION | Freq: Four times a day (QID) | RESPIRATORY_TRACT | Status: DC | PRN

## 2021-12-23 MED ORDER — FENTANYL CITRATE (PF) 100 MCG/2ML IJ SOLN
INTRAMUSCULAR | Status: DC | PRN
  Administered 2021-12-23 (×3): 25 ug via INTRAVENOUS

## 2021-12-23 MED ORDER — ASPIRIN 81 MG PO CHEW: 81.0000 mg | CHEWABLE_TABLET | ORAL | Status: DC

## 2021-12-23 MED ORDER — IRBESARTAN 150 MG PO TABS
150.0000 mg | ORAL_TABLET | Freq: Every day | ORAL | Status: DC
Start: 2021-12-24 — End: 2021-12-25
  Administered 2021-12-24 – 2021-12-25 (×2): 150 mg via ORAL
  Filled 2021-12-23 (×2): qty 1

## 2021-12-23 MED ORDER — NITROGLYCERIN 2 % TD OINT
0.5000 [in_us] | TOPICAL_OINTMENT | Freq: Four times a day (QID) | TRANSDERMAL | Status: DC
  Administered 2021-12-23 – 2021-12-25 (×5): 0.5 [in_us] via TOPICAL
  Filled 2021-12-23: qty 30

## 2021-12-23 MED ORDER — VERAPAMIL HCL 2.5 MG/ML IV SOLN
INTRA_ARTERIAL | Status: DC | PRN
  Administered 2021-12-23: 10 mL via INTRA_ARTERIAL

## 2021-12-23 MED ORDER — FAMOTIDINE IN NACL 20-0.9 MG/50ML-% IV SOLN
INTRAVENOUS | Status: AC | PRN
Start: 2021-12-23 — End: 2021-12-23
  Administered 2021-12-23: 20 mg via INTRAVENOUS

## 2021-12-23 MED ORDER — HYDROCODONE-ACETAMINOPHEN 10-325 MG PO TABS
1.0000 | ORAL_TABLET | Freq: Every day | ORAL | Status: DC
  Administered 2021-12-23 – 2021-12-25 (×9): 1 via ORAL
  Filled 2021-12-23 (×12): qty 1

## 2021-12-23 MED ORDER — ASPIRIN 81 MG PO CHEW
81.0000 mg | CHEWABLE_TABLET | ORAL | Status: AC
  Administered 2021-12-24: 81 mg via ORAL
  Filled 2021-12-23: qty 1

## 2021-12-23 MED ORDER — SODIUM CHLORIDE 0.9% FLUSH
3.0000 mL | Freq: Two times a day (BID) | INTRAVENOUS | Status: DC
  Administered 2021-12-23 – 2021-12-24 (×2): 3 mL via INTRAVENOUS

## 2021-12-23 MED ORDER — SODIUM CHLORIDE 0.9% FLUSH
3.0000 mL | Freq: Two times a day (BID) | INTRAVENOUS | Status: DC
  Administered 2021-12-23 – 2021-12-25 (×4): 3 mL via INTRAVENOUS

## 2021-12-23 MED ORDER — PANTOPRAZOLE SODIUM 40 MG PO TBEC
40.0000 mg | DELAYED_RELEASE_TABLET | Freq: Every day | ORAL | Status: DC
  Administered 2021-12-24 – 2021-12-25 (×2): 40 mg via ORAL
  Filled 2021-12-23 (×2): qty 1

## 2021-12-23 MED ORDER — FENTANYL CITRATE (PF) 100 MCG/2ML IJ SOLN
25.0000 ug | Freq: Once | INTRAMUSCULAR | Status: AC
  Administered 2021-12-23: 25 ug via INTRAVENOUS

## 2021-12-23 MED ORDER — LIDOCAINE HCL (PF) 1 % IJ SOLN
INTRAMUSCULAR | Status: AC
Start: 2021-12-23 — End: ?
  Filled 2021-12-23: qty 30

## 2021-12-23 MED ORDER — LORAZEPAM 1 MG PO TABS
1.0000 mg | ORAL_TABLET | Freq: Three times a day (TID) | ORAL | Status: DC
  Administered 2021-12-23 – 2021-12-25 (×7): 1 mg via ORAL
  Filled 2021-12-23 (×6): qty 1

## 2021-12-23 MED ORDER — FENTANYL CITRATE (PF) 100 MCG/2ML IJ SOLN
INTRAMUSCULAR | Status: AC
Start: 2021-12-23 — End: ?
  Filled 2021-12-23: qty 2

## 2021-12-23 MED ORDER — HYDRALAZINE HCL 20 MG/ML IJ SOLN: 10.0000 mg | INTRAMUSCULAR | Status: AC | PRN

## 2021-12-23 MED ORDER — ASPIRIN 81 MG PO CHEW: 81.0000 mg | CHEWABLE_TABLET | Freq: Every day | ORAL | Status: DC

## 2021-12-23 MED ORDER — MIDAZOLAM HCL 2 MG/2ML IJ SOLN
INTRAMUSCULAR | Status: AC
  Filled 2021-12-23: qty 2

## 2021-12-23 MED ORDER — SODIUM CHLORIDE 0.9 % WEIGHT BASED INFUSION: 1.0000 mL/kg/h | INTRAVENOUS | Status: DC

## 2021-12-23 MED ORDER — LIDOCAINE HCL (PF) 1 % IJ SOLN
INTRAMUSCULAR | Status: DC | PRN
  Administered 2021-12-23: 2 mL

## 2021-12-23 MED ORDER — HEPARIN SODIUM (PORCINE) 1000 UNIT/ML IJ SOLN
INTRAMUSCULAR | Status: AC
  Filled 2021-12-23: qty 10

## 2021-12-23 MED ORDER — HEPARIN (PORCINE) IN NACL 1000-0.9 UT/500ML-% IV SOLN
INTRAVENOUS | Status: DC | PRN
  Administered 2021-12-23 (×2): 500 mL

## 2021-12-23 SURGICAL SUPPLY — 12 items
CATH INFINITI JR4 5F (CATHETERS) ×1 IMPLANT
CATH OPTITORQUE TIG 4.0 5F (CATHETERS) ×1 IMPLANT
DEVICE RAD COMP TR BAND LRG (VASCULAR PRODUCTS) ×1 IMPLANT
GLIDESHEATH SLEND A-KIT 6F 22G (SHEATH) ×1 IMPLANT
GUIDEWIRE INQWIRE 1.5J.035X260 (WIRE) IMPLANT
INQWIRE 1.5J .035X260CM (WIRE) ×2
KIT HEART LEFT (KITS) ×2 IMPLANT
PACK CARDIAC CATHETERIZATION (CUSTOM PROCEDURE TRAY) ×2 IMPLANT
SYR MEDRAD MARK 7 150ML (SYRINGE) ×2 IMPLANT
TRANSDUCER W/STOPCOCK (MISCELLANEOUS) ×2 IMPLANT
TUBING CIL FLEX 10 FLL-RA (TUBING) ×2 IMPLANT
WIRE HI TORQ VERSACORE-J 145CM (WIRE) ×1 IMPLANT

## 2021-12-23 NOTE — Progress Notes (Signed)
Patient transferred from cath lab at 1700hrs.  Oriented to unit and plan of care for shift.  Right radial TR band intact level zero.  Reviewed post cath instructions.

## 2021-12-23 NOTE — Progress Notes (Signed)
Pt stated he had 3/10 chest pain/pressure. 1 nitro SL given, 2L O2 started. Pt states CP is better now just has a headache. BP slightly dropped but back up to baseline (noted in flowsheets).

## 2021-12-23 NOTE — Progress Notes (Signed)
Pt called out stating headache is worse now, 10/10. Chronic back pain is now 8/10. Rhonda PA paged, new order given for Norco (home med).

## 2021-12-23 NOTE — Interval H&P Note (Signed)
Cath Lab Visit (complete for each Cath Lab visit)  Clinical Evaluation Leading to the Procedure:   ACS: No.  Non-ACS:    Anginal Classification: CCS II  Anti-ischemic medical therapy: Minimal Therapy (1 class of medications)  Non-Invasive Test Results: No non-invasive testing performed  Prior CABG: No previous CABG      History and Physical Interval Note:  12/23/2021 3:08 PM  Francisco Austin  has presented today for surgery, with the diagnosis of abnormal coronary CTA.  The various methods of treatment have been discussed with the patient and family. After consideration of risks, benefits and other options for treatment, the patient has consented to  Procedure(s): LEFT HEART CATH AND CORONARY ANGIOGRAPHY (N/A) as a surgical intervention.  The patient's history has been reviewed, patient examined, no change in status, stable for surgery.  I have reviewed the patient's chart and labs.  Questions were answered to the patient's satisfaction.     Quay Burow

## 2021-12-24 ENCOUNTER — Encounter (HOSPITAL_COMMUNITY): Payer: Self-pay | Admitting: Cardiovascular Disease

## 2021-12-24 ENCOUNTER — Observation Stay (HOSPITAL_COMMUNITY): Payer: Medicare Other

## 2021-12-24 ENCOUNTER — Encounter (HOSPITAL_COMMUNITY)
Admission: AD | Disposition: A | Discharge status: Home / Self Care | Payer: Self-pay | Source: Ambulatory Visit | Attending: Cardiovascular Disease

## 2021-12-24 DIAGNOSIS — E785 Hyperlipidemia, unspecified: Secondary | ICD-10-CM

## 2021-12-24 DIAGNOSIS — I2510 Atherosclerotic heart disease of native coronary artery without angina pectoris: Secondary | ICD-10-CM | POA: Diagnosis present

## 2021-12-24 DIAGNOSIS — I2584 Coronary atherosclerosis due to calcified coronary lesion: Secondary | ICD-10-CM | POA: Diagnosis present

## 2021-12-24 DIAGNOSIS — Z20822 Contact with and (suspected) exposure to covid-19: Secondary | ICD-10-CM | POA: Diagnosis present

## 2021-12-24 DIAGNOSIS — I10 Essential (primary) hypertension: Secondary | ICD-10-CM

## 2021-12-24 DIAGNOSIS — I959 Hypotension, unspecified: Secondary | ICD-10-CM | POA: Diagnosis not present

## 2021-12-24 DIAGNOSIS — I25118 Atherosclerotic heart disease of native coronary artery with other forms of angina pectoris: Secondary | ICD-10-CM | POA: Diagnosis not present

## 2021-12-24 HISTORY — PX: CORONARY STENT INTERVENTION: CATH118234

## 2021-12-24 HISTORY — PX: INTRAVASCULAR ULTRASOUND/IVUS: CATH118244

## 2021-12-24 LAB — CBC
HCT: 40.1 % (ref 39.0–52.0)
Hemoglobin: 13.4 g/dL (ref 13.0–17.0)
MCH: 31.3 pg (ref 26.0–34.0)
MCHC: 33.4 g/dL (ref 30.0–36.0)
MCV: 93.7 fL (ref 80.0–100.0)
Platelets: 302 10*3/uL (ref 150–400)
RBC: 4.28 MIL/uL (ref 4.22–5.81)
RDW: 14.1 % (ref 11.5–15.5)
WBC: 9.4 10*3/uL (ref 4.0–10.5)
nRBC: 0 % (ref 0.0–0.2)

## 2021-12-24 LAB — POCT ACTIVATED CLOTTING TIME
Activated Clotting Time: 311 seconds
Activated Clotting Time: 311 seconds
Activated Clotting Time: 317 seconds
Activated Clotting Time: 384 seconds
Activated Clotting Time: 167 seconds

## 2021-12-24 LAB — ECHOCARDIOGRAM LIMITED
Height: 71 in
Weight: 3220.8 oz

## 2021-12-24 LAB — BASIC METABOLIC PANEL
Anion gap: 10 (ref 5–15)
BUN: 10 mg/dL (ref 8–23)
CO2: 23 mmol/L (ref 22–32)
Calcium: 8.8 mg/dL — ABNORMAL LOW (ref 8.9–10.3)
Chloride: 104 mmol/L (ref 98–111)
Creatinine, Ser: 0.79 mg/dL (ref 0.61–1.24)
GFR, Estimated: >60 mL/min (ref >60)
Glucose, Bld: 100 mg/dL — ABNORMAL HIGH (ref 70–99)
Potassium: 4 mmol/L (ref 3.5–5.1)
Sodium: 137 mmol/L (ref 135–145)

## 2021-12-24 SURGERY — CORONARY STENT INTERVENTION
Anesthesia: LOCAL

## 2021-12-24 MED ORDER — SODIUM CHLORIDE 0.9 % IV SOLN: INTRAVENOUS | Status: AC

## 2021-12-24 MED ORDER — NITROGLYCERIN 1 MG/10 ML FOR IR/CATH LAB
INTRA_ARTERIAL | Status: DC | PRN
  Administered 2021-12-24: 300 ug via INTRA_ARTERIAL
  Administered 2021-12-24 (×2): 200 ug via INTRACORONARY

## 2021-12-24 MED ORDER — VERAPAMIL HCL 2.5 MG/ML IV SOLN
INTRAVENOUS | Status: AC
  Filled 2021-12-24: qty 2

## 2021-12-24 MED ORDER — HEPARIN SODIUM (PORCINE) 1000 UNIT/ML IJ SOLN
INTRAMUSCULAR | Status: AC
  Filled 2021-12-24: qty 10

## 2021-12-24 MED ORDER — HEPARIN SODIUM (PORCINE) 1000 UNIT/ML IJ SOLN
INTRAMUSCULAR | Status: DC | PRN
  Administered 2021-12-24: 2000 [IU] via INTRAVENOUS
  Administered 2021-12-24: 10000 [IU] via INTRAVENOUS
  Administered 2021-12-24: 2000 [IU] via INTRAVENOUS

## 2021-12-24 MED ORDER — MIDAZOLAM HCL 2 MG/2ML IJ SOLN
INTRAMUSCULAR | Status: DC | PRN
  Administered 2021-12-24 (×2): 1 mg via INTRAVENOUS
  Administered 2021-12-24: 2 mg via INTRAVENOUS
  Administered 2021-12-24 (×2): 1 mg via INTRAVENOUS

## 2021-12-24 MED ORDER — ASPIRIN 81 MG PO CHEW: 81.0000 mg | CHEWABLE_TABLET | Freq: Every day | ORAL | Status: DC

## 2021-12-24 MED ORDER — LIDOCAINE HCL (PF) 1 % IJ SOLN
INTRAMUSCULAR | Status: DC | PRN
  Administered 2021-12-24: 2 mL
  Administered 2021-12-24: 12 mL

## 2021-12-24 MED ORDER — VIPERSLIDE LUBRICANT OPTIME: TOPICAL | Status: DC | PRN

## 2021-12-24 MED ORDER — LIDOCAINE HCL (PF) 1 % IJ SOLN
INTRAMUSCULAR | Status: AC
  Filled 2021-12-24: qty 30

## 2021-12-24 MED ORDER — SODIUM CHLORIDE 0.9 % IV SOLN
INTRAVENOUS | Status: AC | PRN
Start: 2021-12-24 — End: 2021-12-24
  Administered 2021-12-24: 10 mL/h via INTRAVENOUS

## 2021-12-24 MED ORDER — LABETALOL HCL 5 MG/ML IV SOLN: 10.0000 mg | INTRAVENOUS | Status: AC | PRN

## 2021-12-24 MED ORDER — HEPARIN (PORCINE) IN NACL 1000-0.9 UT/500ML-% IV SOLN
INTRAVENOUS | Status: DC | PRN
  Administered 2021-12-24 (×2): 500 mL

## 2021-12-24 MED ORDER — HYDROMORPHONE HCL 1 MG/ML IJ SOLN
INTRAMUSCULAR | Status: AC
  Filled 2021-12-24: qty 0.5

## 2021-12-24 MED ORDER — FENTANYL CITRATE (PF) 100 MCG/2ML IJ SOLN
INTRAMUSCULAR | Status: DC | PRN
  Administered 2021-12-24: 25 ug via INTRAVENOUS
  Administered 2021-12-24: 50 ug via INTRAVENOUS
  Administered 2021-12-24 (×3): 25 ug via INTRAVENOUS

## 2021-12-24 MED ORDER — SODIUM CHLORIDE 0.9% FLUSH: 3.0000 mL | INTRAVENOUS | Status: DC | PRN

## 2021-12-24 MED ORDER — ACETAMINOPHEN 325 MG PO TABS: 650.0000 mg | ORAL_TABLET | ORAL | Status: DC | PRN

## 2021-12-24 MED ORDER — MIDAZOLAM HCL 2 MG/2ML IJ SOLN
INTRAMUSCULAR | Status: AC
  Filled 2021-12-24: qty 2

## 2021-12-24 MED ORDER — SODIUM CHLORIDE 0.9% FLUSH
3.0000 mL | Freq: Two times a day (BID) | INTRAVENOUS | Status: DC
  Administered 2021-12-24 – 2021-12-25 (×2): 3 mL via INTRAVENOUS

## 2021-12-24 MED ORDER — VERAPAMIL HCL 2.5 MG/ML IV SOLN
INTRAVENOUS | Status: DC | PRN
  Administered 2021-12-24: 10 mL via INTRA_ARTERIAL

## 2021-12-24 MED ORDER — IOHEXOL 350 MG/ML SOLN
INTRAVENOUS | Status: DC | PRN
  Administered 2021-12-24: 165 mL

## 2021-12-24 MED ORDER — DILTIAZEM HCL ER COATED BEADS 240 MG PO CP24
240.0000 mg | ORAL_CAPSULE | Freq: Every day | ORAL | Status: DC
  Administered 2021-12-24 – 2021-12-25 (×2): 240 mg via ORAL
  Filled 2021-12-24 (×2): qty 1

## 2021-12-24 MED ORDER — FENTANYL CITRATE (PF) 100 MCG/2ML IJ SOLN
INTRAMUSCULAR | Status: AC
  Filled 2021-12-24: qty 2

## 2021-12-24 MED ORDER — ONDANSETRON HCL 4 MG/2ML IJ SOLN: 4.0000 mg | Freq: Four times a day (QID) | INTRAMUSCULAR | Status: DC | PRN

## 2021-12-24 MED ORDER — SODIUM CHLORIDE 0.9 % IV SOLN: 250.0000 mL | INTRAVENOUS | Status: DC | PRN

## 2021-12-24 MED ORDER — HYDROMORPHONE HCL 1 MG/ML IJ SOLN
INTRAMUSCULAR | Status: DC | PRN
  Administered 2021-12-24: .5 mg via INTRAVENOUS

## 2021-12-24 MED ORDER — NITROGLYCERIN 1 MG/10 ML FOR IR/CATH LAB
INTRA_ARTERIAL | Status: AC
  Filled 2021-12-24: qty 10

## 2021-12-24 MED ORDER — CLOPIDOGREL BISULFATE 75 MG PO TABS
75.0000 mg | ORAL_TABLET | Freq: Every day | ORAL | Status: DC
  Administered 2021-12-25: 75 mg via ORAL
  Filled 2021-12-24: qty 1

## 2021-12-24 MED ORDER — HEPARIN (PORCINE) IN NACL 1000-0.9 UT/500ML-% IV SOLN
INTRAVENOUS | Status: AC
  Filled 2021-12-24: qty 1000

## 2021-12-24 MED ORDER — HYDRALAZINE HCL 20 MG/ML IJ SOLN: 10.0000 mg | INTRAMUSCULAR | Status: AC | PRN

## 2021-12-24 SURGICAL SUPPLY — 40 items
BAG SNAP BAND KOVER 36X36 (MISCELLANEOUS) ×1 IMPLANT
BALLN EUPHORA RX 3.75X15 (BALLOONS) ×2
BALLN SAPPHIRE 3.0X20 (BALLOONS) ×2
BALLN SCOREFLEX 2.50X10 (BALLOONS) ×2
BALLOON EUPHORA RX 3.75X15 (BALLOONS) IMPLANT
BALLOON SAPPHIRE 3.0X20 (BALLOONS) IMPLANT
BALLOON SCOREFLEX 2.50X10 (BALLOONS) IMPLANT
CATH LAUNCHER 6FR AL1 (CATHETERS) IMPLANT
CATH LAUNCHER 6FR EBU3.5 (CATHETERS) ×1 IMPLANT
CATH OPTICROSS HD (CATHETERS) ×1 IMPLANT
CATH S G BIP PACING (CATHETERS) ×1 IMPLANT
CATH TELEPORT (CATHETERS) ×1 IMPLANT
CATHETER LAUNCHER 6FR AL1 (CATHETERS) ×2
CROWN DIAMONDBACK CLASSIC 1.25 (BURR) ×1 IMPLANT
DEVICE RAD COMP TR BAND LRG (VASCULAR PRODUCTS) ×1 IMPLANT
ELECT DEFIB PAD ADLT CADENCE (PAD) ×1 IMPLANT
GLIDESHEATH SLEND SS 6F .021 (SHEATH) ×1 IMPLANT
GUIDEWIRE INQWIRE 1.5J.035X260 (WIRE) IMPLANT
INQWIRE 1.5J .035X260CM (WIRE) ×2
KIT ENCORE 26 ADVANTAGE (KITS) ×1 IMPLANT
KIT HEART LEFT (KITS) ×2 IMPLANT
KIT HEMO VALVE WATCHDOG (MISCELLANEOUS) ×1 IMPLANT
LUBRICANT VIPERSLIDE CORONARY (MISCELLANEOUS) ×1 IMPLANT
MAT PREVALON FULL STRYKER (MISCELLANEOUS) ×1 IMPLANT
PACK CARDIAC CATHETERIZATION (CUSTOM PROCEDURE TRAY) ×2 IMPLANT
SHEATH PINNACLE 6F 10CM (SHEATH) ×1 IMPLANT
SHEATH PROBE COVER 6X72 (BAG) ×1 IMPLANT
SLED PULL BACK IVUS (MISCELLANEOUS) ×1 IMPLANT
SLEEVE REPOSITIONING LENGTH 30 (MISCELLANEOUS) ×1 IMPLANT
STENT ONYX FRONTIER 3.5X12 (Permanent Stent) ×1 IMPLANT
STENT SYNERGY XD 3.50X24 (Permanent Stent) IMPLANT
STENT SYNERGY XD 4.0X38 (Permanent Stent) IMPLANT
SYNERGY XD 3.50X24 (Permanent Stent) ×2 IMPLANT
SYNERGY XD 4.0X38 (Permanent Stent) ×2 IMPLANT
TRANSDUCER W/STOPCOCK (MISCELLANEOUS) ×2 IMPLANT
TUBING CIL FLEX 10 FLL-RA (TUBING) ×2 IMPLANT
WIRE ASAHI PROWATER 180CM (WIRE) ×1 IMPLANT
WIRE ASAHI PROWATER 300CM (WIRE) ×1 IMPLANT
WIRE PACING TEMP ST TIP 5 (CATHETERS) ×1 IMPLANT
WIRE VIPERWIRE COR FLEX .012 (WIRE) ×1 IMPLANT

## 2021-12-24 NOTE — Progress Notes (Signed)
°  Transition of Care [facility]) Screening Note   Patient Details  Name: Francisco Austin Date of Birth: 25-May-1951   Transition of Care [facility]) CM/SW Contact:    Milas Gain, [place] Phone Number: 12/24/2021, 3:32 PM    Transition of Care Department [facility]) has reviewed patient and no TOC needs have been identified at this time. We will continue to monitor patient advancement through interdisciplinary progression rounds. If new patient transition needs arise, please place a TOC consult.

## 2021-12-24 NOTE — Progress Notes (Signed)
°  Echocardiogram 2D Echocardiogram has been performed.  Francisco Austin 12/24/2021, 3:18 PM

## 2021-12-24 NOTE — Interval H&P Note (Signed)
Cath Lab Visit (complete for each Cath Lab visit)  Clinical Evaluation Leading to the Procedure:   ACS: No.  Non-ACS:    Anginal Classification: CCS III  Anti-ischemic medical therapy: Minimal Therapy (1 class of medications)  Non-Invasive Test Results: High-risk stress test findings: cardiac mortality >3%/year  Prior CABG: No previous CABG  Planned atherectomy of the RCA and PCI of LAD.   All questions answered.   History and Physical Interval Note:  12/24/2021 12:16 PM  Francisco Austin  has presented today for surgery, with the diagnosis of Coronary artery disease.  The various methods of treatment have been discussed with the patient and family. After consideration of risks, benefits and other options for treatment, the patient has consented to  Procedure(s): CORONARY STENT INTERVENTION (N/A) as a surgical intervention.  The patient's history has been reviewed, patient examined, no change in status, stable for surgery.  I have reviewed the patient's chart and labs.  Questions were answered to the patient's satisfaction.     Larae Grooms

## 2021-12-24 NOTE — Progress Notes (Signed)
Progress Note  Patient Name: Francisco Austin Date of Encounter: 12/24/2021  [facility] Cardiologist: Quay Burow, MD   Subjective   Patient does not have any questions about his procedure this afternoon. Reports some back pain, SOB. Wearing a nasal cannula with 1 L oxygen. Denies any chest pain or pain around right radial cath site.  Inpatient Medications    Scheduled Meds:  aspirin  81 mg Oral Daily   atorvastatin  80 mg Oral Daily   diltiazem  240 mg Oral Daily   HYDROcodone-acetaminophen  1 tablet Oral 5 X Daily   irbesartan  150 mg Oral Daily   LORazepam  1 mg Oral TID   nitroGLYCERIN  0.5 inch Topical Q6H   pantoprazole  40 mg Oral Daily   sodium chloride flush  3 mL Intravenous Q12H   sodium chloride flush  3 mL Intravenous Q12H   Continuous Infusions:  sodium chloride     sodium chloride     sodium chloride 1 mL/kg/hr (12/24/21 0437)   PRN Meds: sodium chloride, sodium chloride, acetaminophen, albuterol, ondansetron (ZOFRAN) IV, sodium chloride flush, sodium chloride flush   Vital Signs    Vitals:   12/24/21 0049 12/24/21 0440 12/24/21 0546 12/24/21 0809  BP: 100/60 112/68    Pulse: 83 88 88   Resp: 18 18  17   Temp: 97.9 F (36.6 C) 98.2 F (36.8 C)  98.1 F (36.7 C)  TempSrc: Oral Oral  Oral  SpO2: 92%  97%   Weight:   91.3 kg   Height:        Intake/Output Summary (Last 24 hours) at 12/24/2021 0828 Last data filed at 12/24/2021 0439 Gross per 24 hour  Intake 1015.49 ml  Output 1425 ml  Net -409.51 ml   Last 3 Weights 12/24/2021 12/23/2021 12/18/2021  Weight (lbs) 201 lb 4.8 oz 206 lb 206 lb 12.8 oz  Weight (kg) 91.309 kg 93.441 kg 93.804 kg      Telemetry    Sinus rhythm, HR in 70s-80s - Personally Reviewed  ECG    EKG 2/20 showed sinus rhythm, nonspecific T wave abnormality (biphasic T waves in V4, V5) - Personally Reviewed  Physical Exam   GEN: No acute distress.   Neck: No JVD Cardiac: RRR, no murmurs, rubs, or gallops.   Respiratory: Expiratory wheezes heard throughout, inspiratory rales that cleared with coughing.   GI: Soft, nontender, non-distended  MS: No edema; No deformity. Right radial cath site without tenderness or bleeding.  Neuro:  Nonfocal  Psych: Normal affect   Labs    High Sensitivity Troponin:  No results for input(s): TROPONINIHS in the last 720 hours.   Chemistry Recent Labs  Lab 12/23/21 1812 12/24/21 0459  NA 133* 137  K 3.5 4.0  CL 102 104  CO2 22 23  GLUCOSE 159* 100*  BUN 8 10  CREATININE 0.80 0.79  CALCIUM 8.5* 8.8*  GFRNONAA >60 >60  ANIONGAP 9 10    Lipids No results for input(s): CHOL, TRIG, HDL, LABVLDL, LDLCALC, CHOLHDL in the last 168 hours.  Hematology Recent Labs  Lab 12/18/21 1225 12/23/21 1812 12/24/21 0459  WBC 8.1 7.7 9.4  RBC 4.95 4.49 4.28  HGB 15.2 13.8 13.4  HCT 45.3 42.8 40.1  MCV 92 95.3 93.7  MCH 30.7 30.7 31.3  MCHC 33.6 32.2 33.4  RDW 13.1 14.0 14.1  PLT 371 311 302   Thyroid No results for input(s): TSH, FREET4 in the last 168 hours.  BNPNo results  for input(s): BNP, PROBNP in the last 168 hours.  DDimer No results for input(s): DDIMER in the last 168 hours.   Radiology    CARDIAC CATHETERIZATION  Result Date: 12/23/2021 Images from the original result were not included.   Prox LAD lesion is 90% stenosed.   Prox RCA to Mid RCA lesion is 80% stenosed.   The left ventricular systolic function is normal.   LV end diastolic pressure is normal.   The left ventricular ejection fraction is 55-65% by visual estimate. Francisco Austin is a 71 y.o. male  [id number] LOCATION:  FACILITY: [facility] PHYSICIAN: Quay Burow, M.D. September 12, 1951 DATE OF PROCEDURE:  12/23/2021 DATE OF DISCHARGE: CARDIAC CATHETERIZATION History obtained from chart review.Francisco Austin is a 51 y.o.  father of 2 children, with no grandchildren, former patient of Dr. Thurman Coyer, who was referred to me by Dr. [name] to be established in my practice because of known coronary artery  disease.  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  I last saw him in the office 11/30/2020. He does have treated hypertension hyperlipidemia.  His mother Francisco Austin who died in 2017-01-30 was my patient as well.  He is never had a heart attack or stroke.  He is had several heart catheterizations Dr. [name] remotely with one PCI but no stent.  He does get frequent atypical chest pain.  His last 2D echo performed 04/06/2017 was essentially normal.  Since I saw him a year ago he continues to have atypical chest pain which has not changed in frequency or severity.  Does also have spinal stenosis and has difficulty ambulating.  He continues to smoke a pipe.  I performed coronary CTA revealing a coronary calcium score of 1970 with positive CT FFR in the proximal LAD and proximal RCA.  Based on this, it was elected to proceed with outpatient radial diagnostic coronary angiography.   [name] has two-vessel disease with a 90% fairly focal proximal LAD stenosis at the takeoff of the first diagonal branch which I think can be fairly easily fixed with balloon and stenting.  He also has a more complex proximal to mid RCA which is highly calcified and probably should best be treated with orbital atherectomy followed by PCI drug-eluting stenting.  His circumflex has minimal disease.  His LV function is normal.  My TIG catheter dipped into his LV and most likely initiated a fast tachycardia which resolved when I withdrew the catheter.  The patient's baseline EKG changed from narrow complex to appears to be a bundle branch block.  He did develop chest pain with this and relative hypotension with systolic blood pressure in the 90s.  Based on this, I elected not to proceed with intervention but rather will watch him overnight and arrange for Dr. Irish Lack to revascularize tomorrow.  I did load him with 600 mg of Plavix and placed him on aspirin and clopidogrel in anticipation of his procedure.  The sheath was removed  and a TR band was placed on the right wrist to achieve patent hemostasis.  The patient left lab in stable condition. Quay Burow. MD, [facility] 12/23/2021 4:03 PM     Cardiac Studies   Left Heart Cath 12/23/2021    Prox LAD lesion is 90% stenosed.   Prox RCA to Mid RCA lesion is 80% stenosed.   The left ventricular systolic function is normal.   LV end diastolic pressure is normal.   The left ventricular ejection fraction is 55-65%  by visual estimate.  IMPRESSION: Mr. Eisen has two-vessel disease with a 90% fairly focal proximal LAD stenosis at the takeoff of the first diagonal branch which I think can be fairly easily fixed with balloon and stenting.  He also has a more complex proximal to mid RCA which is highly calcified and probably should best be treated with orbital atherectomy followed by PCI drug-eluting stenting.  His circumflex has minimal disease.  His LV function is normal.  My TIG catheter dipped into his LV and most likely initiated a fast tachycardia which resolved when I withdrew the catheter.  The patient's baseline EKG changed from narrow complex to appears to be a bundle branch block.  He did develop chest pain with this and relative hypotension with systolic blood pressure in the 90s.  Based on this, I elected not to proceed with intervention but rather will watch him overnight and arrange for Dr. Irish Lack to revascularize tomorrow.  I did load him with 600 mg of Plavix and placed him on aspirin and clopidogrel in anticipation of his procedure.  The sheath was removed and a TR band was placed on the right wrist to achieve patent hemostasis.  The patient left lab in stable condition.  Diagnostic Dominance: Right  Patient Profile     71 y.o. male with a PMH of known CAD, HLD, HTN who was scheduled for an outpatient heart catheterization on 2/20 after a coronary cta suggested a high-grade calcified lesion in his proximal RCA. LHC on 2/20 showed a prox LAD lesion that was 90% stenosed  and a complex proximal to mid RCA lesion that was 80% stenosed. Due to the complexity of the RCA lesion and patient's tolerance of the procedure, elected to proceed with intervention on 2/21.   Assessment & Plan    CAD  - LHC on 2/20 revealed a prox LAD lesion that was 90% stenosed and a complex proximal to mid RCA lesion that was 80% stenosed - Scheduled for revascularization today. Patient has been loaded with Plavix in anticipation of procedure  - Patient NPO  - Continue DAPT with ASA, plavix  - Continue statin therapy - Patient not currently on a BB. On telmisartan, diltiazem for BP control. BP is well controlled on this regiment, but patient could benefit from medication adjustments to allow initiation of BB.  HTN - Stable on diltiazem and losartan (telmisartan PTA)  - Consider adjusting medications to make BP room for BB (see above)   HLD - LDL 90, HDL 36, triglycerides 111 on 12/03/21 - Continue lipitor 80 mg.   For questions or updates, please contact [facility] Please consult [email or web address] for contact info under        Signed, Margie Billet, PA-C  12/24/2021, 8:28 AM

## 2021-12-24 NOTE — Progress Notes (Addendum)
Site area: Right groin a 7 french venous sheath was removed  Site Prior to Removal:  Level 0  Pressure Applied For 30 MINUTES    Bedrest Beginning at 1800pm X2 hours  Manual:   Yes.    Patient Status During Pull:  stable  Post Pull Groin Site:  Level 0  Post Pull Instructions Given:  Yes.    Post Pull Pulses Present:  Yes.    Dressing Applied:  Yes.    Comments:   Staff RN viewed site

## 2021-12-25 ENCOUNTER — Encounter (HOSPITAL_COMMUNITY): Payer: Self-pay | Admitting: Interventional Cardiology

## 2021-12-25 ENCOUNTER — Other Ambulatory Visit (HOSPITAL_COMMUNITY): Payer: Self-pay

## 2021-12-25 DIAGNOSIS — I10 Essential (primary) hypertension: Secondary | ICD-10-CM | POA: Diagnosis not present

## 2021-12-25 DIAGNOSIS — E7800 Pure hypercholesterolemia, unspecified: Secondary | ICD-10-CM | POA: Diagnosis not present

## 2021-12-25 DIAGNOSIS — E1169 Type 2 diabetes mellitus with other specified complication: Secondary | ICD-10-CM | POA: Diagnosis not present

## 2021-12-25 LAB — CBC
HCT: 39.7 % (ref 39.0–52.0)
Hemoglobin: 13.1 g/dL (ref 13.0–17.0)
MCH: 30.8 pg (ref 26.0–34.0)
MCHC: 33 g/dL (ref 30.0–36.0)
MCV: 93.4 fL (ref 80.0–100.0)
Platelets: 268 10*3/uL (ref 150–400)
RBC: 4.25 MIL/uL (ref 4.22–5.81)
RDW: 13.9 % (ref 11.5–15.5)
WBC: 9.3 10*3/uL (ref 4.0–10.5)
nRBC: 0 % (ref 0.0–0.2)

## 2021-12-25 MED ORDER — CLOPIDOGREL BISULFATE 75 MG PO TABS
75.0000 mg | ORAL_TABLET | Freq: Every day | ORAL | 3 refills | Status: DC
  Filled 2021-12-25: qty 90, 90d supply, fill #0

## 2021-12-25 NOTE — Progress Notes (Signed)
CARDIAC REHAB PHASE I   PRE:  Rate/Rhythm: 90 SR    BP: sitting 113/92    SaO2: 91 RA  MODE:  Ambulation: 430 ft   POST:  Rate/Rhythm: 105ST    BP: sitting 103/82     SaO2: 88-89 RA  Tolerated well. Sts he has chronic vasospasms that he is having today (barely mentions this). saO2 borderline low but denied SOB. In bed ranged from 91-92 RA. Pt sts he has his normal sinus congestion.   Discussed with pt stent, Plavix, diet, smoking cessation (not interested), exercise, NTG and CRPII. Will refer to [place], ACSM 12/25/2021 10:50 AM

## 2021-12-25 NOTE — Discharge Summary (Signed)
Discharge Summary    Patient ID: Francisco Austin MRN: [id number]; DOB: 09-Nov-1950  Admit date: 12/23/2021 Discharge date: 12/25/2021  PCP:  Francisco Frees, MD   [facility] Providers Cardiologist:  Francisco Burow, MD      Discharge Diagnoses    Principal Problem:   CAD (coronary artery disease) Active Problems:   Essential hypertension   Hyperlipidemia   Coronary artery disease   Abnormal findings on diagnostic imaging of heart and coronary circulation    Diagnostic Studies/Procedures    Left Heart Cath on 12/23/2021     Prox LAD lesion is 90% stenosed.   Prox RCA to Mid RCA lesion is 80% stenosed.   The left ventricular systolic function is normal.   LV end diastolic pressure is normal.   The left ventricular ejection fraction is 55-65% by visual estimate.  IMPRESSION: Francisco Austin has two-vessel disease with a 90% fairly focal proximal LAD stenosis at the takeoff of the first diagonal Francisco Austin which I think can be fairly easily fixed with balloon and stenting.  He also has a more complex proximal to mid RCA which is highly calcified and probably should best be treated with orbital atherectomy followed by PCI drug-eluting stenting.  His circumflex has minimal disease.  His LV function is normal.  My TIG catheter dipped into his LV and most likely initiated a fast tachycardia which resolved when I withdrew the catheter.  The patient's baseline EKG changed from narrow complex to appears to be a bundle Francisco Austin Francisco Austin block.  He did develop chest pain with this and relative hypotension with systolic blood pressure in the 90s.  Based on this, I elected not to proceed with intervention but rather will watch him overnight and arrange for Dr. Irish Austin to revascularize tomorrow.  I did load him with 600 mg of Plavix and placed him on aspirin and clopidogrel in anticipation of his procedure.  The sheath was removed and a TR band was placed on the right wrist to achieve patent hemostasis.  The patient  left lab in stable condition. Diagnostic Dominance: Right    Coronary Stent Intervention on 12/24/2021    Prox LAD lesion is 90% stenosed.   A drug-eluting stent was successfully placed using a STENT ONYX FRONTIER 3.5X12 and optimized with intravascular ultrasound.   Post intervention, there is a 0% residual stenosis.   Prox RCA to Mid RCA lesion is 80% stenosed.   Mid RCA lesion is 75% stenosed.  The RCA disease was treated with orbital atherectomy in the proximal to mid vessel   A drug-eluting stent was successfully placed using a SYNERGY XD 4.0X38 distally.   A drug-eluting stent was successfully placed using a SYNERGY XD 3.50X24 overlapping proximally.  There appeared to be some dye behind the stent.  This was confirmed by intravascular ultrasound.  Echocardiogram did not show any perforation.   Post intervention, there is a 0% residual stenosis.   Post intervention, there is a 0% residual stenosis.   Complex intervention of the RCA.  Successful PCI of the LAD.  He will need dual antiplatelet therapy for at least 6 months.  I would strongly consider lifelong clopidogrel monotherapy given the calcific disease he has in his right coronary artery.  Continue aggressive secondary prevention. Diagnostic Dominance: Right Intervention   _____________   History of Present Illness     Francisco Austin is a 71 y.o. male with a PMH of known CAD, HLD, HTN who was scheduled for an outpatient heart catheterization on 2/20  after a coronary cta suggested a high-grade calcified lesion in his proximal RCA. LHC on 2/20 showed a prox LAD lesion that was 90% stenosed and a complex proximal to mid RCA lesion that was 80% stenosed. Due to the complexity of the RCA lesion and patient's tolerance of the procedure, elected to proceed with intervention on 2/21.   Hospital Course     Consultants: None    CAD - LHC on 2/20 revealed a prox LAD lesion that was 90% stenosed and a complex proximal to mid RCA  lesion that was 80% stenosed - Underwent intervention on 2/21 with PCI to the LAD, orbital atherectomy and DES placement to the RCA. Intervention to the RCA was complx  - Continue DAPT with ASA, plavix. Will likely need lifelong plavix therapy given the calcific disease in his RCA   - Continue statin therapy - Patient not currently on a BB. On telmisartan, diltiazem for BP control. BP is well controlled on this regiment, but patient could benefit from medication adjustments to allow initiation of BB.   HTN - Stable on diltiazem and losartan (telmisartan PTA)  - Consider adjusting medications to make BP room for BB (see above)    HLD - LDL 90, HDL 36, triglycerides 111 on 12/03/21 - Continue lipitor 80 mg.   Physical Exam Constitutional:      General: He is not in acute distress.    Appearance: Normal appearance.  HENT:     Head: Normocephalic and atraumatic.     Nose: Nose normal.  Eyes:     Extraocular Movements: Extraocular movements intact.     Conjunctiva/sclera: Conjunctivae normal.  Cardiovascular:     Rate and Rhythm: Normal rate and regular rhythm.     Pulses: Normal pulses.     Heart sounds: No murmur heard.   No friction rub. No gallop.  Pulmonary:     Effort: Pulmonary effort is normal.     Breath sounds: Wheezing present. No rhonchi or rales.  Chest:     Chest wall: No tenderness.  Abdominal:     Palpations: Abdomen is soft.     Tenderness: There is no abdominal tenderness.  Musculoskeletal:        General: Normal range of motion.     Cervical back: Normal range of motion.     Comments: Right radial cath site nontender, good hemostasis.    Skin:    General: Skin is warm and dry.  Neurological:     General: No focal deficit present.     Mental Status: He is alert and oriented to person, place, and time.  Psychiatric:        Mood and Affect: Mood normal.        Behavior: Behavior normal.     Did the patient have an acute coronary syndrome (MI, NSTEMI,  STEMI, etc) this admission?:  No                               Did the patient have a percutaneous coronary intervention (stent / angioplasty)?:  Yes.     Cath/PCI Registry Performance & Quality Measures: Aspirin prescribed? - Yes ADP Receptor Inhibitor (Plavix/Clopidogrel, Brilinta/Ticagrelor or Effient/Prasugrel) prescribed (includes medically managed patients)? - Yes High Intensity Statin (Lipitor 40-80mg  or Crestor 20-40mg ) prescribed? - Yes For EF <40%, was ACEI/ARB prescribed? - Yes For EF <40%, Aldosterone Antagonist (Spironolactone or Eplerenone) prescribed? - Not Applicable (EF >/= 89%) Cardiac Rehab Phase II  ordered? - Yes   Patient was seen and evaluated by Dr. Harl Bowie and deemed stable for discharge.   Patient has a follow up appointment with Dr. Gwenlyn Found on 01/15/2022.    _____________  Discharge Vitals Blood pressure 102/65, pulse 87, temperature 97.9 F (36.6 C), temperature source Oral, resp. rate 18, height 5\' 11"  (1.803 m), weight 91.3 kg, SpO2 92 %.  Filed Weights   12/23/21 1119 12/24/21 0546  Weight: 93.4 kg 91.3 kg    Labs & Radiologic Studies    CBC Recent Labs    12/24/21 0459 12/25/21 0224  WBC 9.4 9.3  HGB 13.4 13.1  HCT 40.1 39.7  MCV 93.7 93.4  PLT 302 786   Basic Metabolic Panel Recent Labs    12/23/21 1812 12/24/21 0459  NA 133* 137  K 3.5 4.0  CL 102 104  CO2 22 23  GLUCOSE 159* 100*  BUN 8 10  CREATININE 0.80 0.79  CALCIUM 8.5* 8.8*   Liver Function Tests No results for input(s): AST, ALT, ALKPHOS, BILITOT, PROT, ALBUMIN in the last 72 hours. No results for input(s): LIPASE, AMYLASE in the last 72 hours. High Sensitivity Troponin:   No results for input(s): TROPONINIHS in the last 720 hours.  BNP Invalid input(s): POCBNP D-Dimer No results for input(s): DDIMER in the last 72 hours. Hemoglobin A1C No results for input(s): HGBA1C in the last 72 hours. Fasting Lipid Panel No results for input(s): CHOL, HDL, LDLCALC, TRIG,  CHOLHDL, LDLDIRECT in the last 72 hours. Thyroid Function Tests No results for input(s): TSH, T4TOTAL, T3FREE, THYROIDAB in the last 72 hours.  Invalid input(s): FREET3 _____________  CARDIAC CATHETERIZATION  Addendum Date: 12/24/2021     Prox LAD lesion is 90% stenosed.   A drug-eluting stent was successfully placed using a STENT ONYX FRONTIER 3.5X12 and optimized with intravascular ultrasound.   Post intervention, there is a 0% residual stenosis.   Prox RCA to Mid RCA lesion is 80% stenosed.   Mid RCA lesion is 75% stenosed.  The RCA disease was treated with orbital atherectomy in the proximal to mid vessel   A drug-eluting stent was successfully placed using a SYNERGY XD 4.0X38 distally.   A drug-eluting stent was successfully placed using a SYNERGY XD 3.50X24 overlapping proximally.  There appeared to be some dye behind the stent.  This was confirmed by intravascular ultrasound.  Echocardiogram did not show any perforation.   Post intervention, there is a 0% residual stenosis.   Post intervention, there is a 0% residual stenosis. Complex intervention of the RCA.  Successful PCI of the LAD.  He will need dual antiplatelet therapy for at least 6 months.  I would strongly consider lifelong clopidogrel monotherapy given the calcific disease he has in his right coronary artery. Continue aggressive secondary prevention.  Result Date: 12/24/2021   Prox LAD lesion is 90% stenosed.   A drug-eluting stent was successfully placed using a STENT ONYX FRONTIER 3.5X12 and optimized with intravascular ultrasound.   Post intervention, there is a 0% residual stenosis.   Prox RCA to Mid RCA lesion is 80% stenosed.   Mid RCA lesion is 75% stenosed.  The RCA disease was treated with orbital atherectomy in the proximal to mid vessel   A drug-eluting stent was successfully placed using a SYNERGY XD 4.0X38 distally.   A drug-eluting stent was successfully placed using a SYNERGY XD 3.50X24 overlapping proximally.  There  appeared to be some dye behind the stent.  This was confirmed by intravascular  ultrasound.  Echocardiogram did not show any perforation.   Post intervention, there is a 0% residual stenosis.   Post intervention, there is a 0% residual stenosis. Complex intervention of the RCA.  Successful PCI of the LAD.  He will need dual antiplatelet therapy for at least 6 months.  I would strongly consider lifelong clopidogrel monotherapy given the calcific disease he has in his right coronary artery. Continue aggressive secondary prevention.   CARDIAC CATHETERIZATION  Result Date: 12/23/2021 Images from the original result were not included.   Prox LAD lesion is 90% stenosed.   Prox RCA to Mid RCA lesion is 80% stenosed.   The left ventricular systolic function is normal.   LV end diastolic pressure is normal.   The left ventricular ejection fraction is 55-65% by visual estimate. Francisco Austin is a 71 y.o. male  [id number] LOCATION:  FACILITY: [facility] PHYSICIAN: Francisco Austin, M.D. 25-Apr-1951 DATE OF PROCEDURE:  12/23/2021 DATE OF DISCHARGE: CARDIAC CATHETERIZATION History obtained from chart review.Francisco Austin is a 4 y.o.  father of 2 children, with no grandchildren, former patient of Dr. Thurman Coyer, who was referred to me by Dr. [name] to be established in my practice because of known coronary artery disease.  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  I last saw him in the office 11/30/2020. He does have treated hypertension hyperlipidemia.  His mother [name] who died in Jan 15, 2017 was my patient as well.  He is never had a heart attack or stroke.  He is had several heart catheterizations Dr. [name] remotely with one PCI but no stent.  He does get frequent atypical chest pain.  His last 2D echo performed 04/06/2017 was essentially normal.  Since I saw him a year ago he continues to have atypical chest pain which has not changed in frequency or severity.  Does also have spinal stenosis and has difficulty  ambulating.  He continues to smoke a pipe.  I performed coronary CTA revealing a coronary calcium score of 1970 with positive CT FFR in the proximal LAD and proximal RCA.  Based on this, it was elected to proceed with outpatient radial diagnostic coronary angiography.   Mr. Blank has two-vessel disease with a 90% fairly focal proximal LAD stenosis at the takeoff of the first diagonal Angie Hogg which I think can be fairly easily fixed with balloon and stenting.  He also has a more complex proximal to mid RCA which is highly calcified and probably should best be treated with orbital atherectomy followed by PCI drug-eluting stenting.  His circumflex has minimal disease.  His LV function is normal.  My TIG catheter dipped into his LV and most likely initiated a fast tachycardia which resolved when I withdrew the catheter.  The patient's baseline EKG changed from narrow complex to appears to be a bundle Ceilidh Torregrossa block.  He did develop chest pain with this and relative hypotension with systolic blood pressure in the 90s.  Based on this, I elected not to proceed with intervention but rather will watch him overnight and arrange for Dr. Irish Austin to revascularize tomorrow.  I did load him with 600 mg of Plavix and placed him on aspirin and clopidogrel in anticipation of his procedure.  The sheath was removed and a TR band was placed on the right wrist to achieve patent hemostasis.  The patient left lab in stable condition. Francisco Austin. MD, [facility] 12/23/2021 4:03 PM    CT CORONARY MORPH W/CTA COR W/SCORE W/CA  W/CM &/OR WO/CM  Addendum Date: 12/16/2021   ADDENDUM REPORT: 12/16/2021 13:36 HISTORY: 71 yo male with chest pain, nonspecific EXAM: Cardiac/Coronary CTA TECHNIQUE: The patient was scanned on a [employer]. PROTOCOL: A 120 kV prospective scan was triggered in the descending thoracic aorta at 111 HU's. Axial non-contrast 3 mm slices were carried out through the heart. The data set was analyzed on a dedicated  work station and scored using the Switzer. Gantry rotation speed was 250 msecs and collimation was .6 mm. Beta blockade and 0.8 mg of sl NTG was given. The 3D data set was reconstructed in 5% intervals of the 35-75 % of the R-R cycle. Diastolic phases were analyzed on a dedicated work station using MPR, MIP and VRT modes. The patient received 64mL OMNIPAQUE IOHEXOL 350 MG/ML SOLN of contrast. FINDINGS: Quality: Excellent.  HR 52 Coronary calcium score: The patient's coronary artery calcium score is 1970, which places the patient in the 93rd percentile. Coronary arteries: Normal coronary origins.  Right dominance. Right Coronary Artery: Dominant. Heavily calcified throughout the vessel. Probably severe proximal stenosis (70-99%, CADRADS4a). Left Main Coronary Artery: Minimal 1-24% mixed ostial stenosis. Bifurcates into the LAD and LCx arteries. Left Anterior Descending Coronary Artery: Anterior artery that wraps around the apex. There is mild mixed 25-49% proximal stenosis (CADRADS2). Punctate calcification is noted of the mid to distal vessel. Small proximal D1 Meganne Rita with severe (70-99%, CADRADS4a) ostial stenosis - vessel is <2 mm. Left Circumflex Artery: Heavily calcified lateral Raimi Guillermo with moderate 50-69% proximal to mid-vessel stenosis (CADRADS3). Aorta: Normal size, 35 mm at the mid ascending aorta (level of the PA bifurcation) measured double oblique. Aortic atherosclerosis. No dissection. Aortic Valve: Trileaflet. No calcifications. Other findings: Normal pulmonary vein drainage into the left atrium. Normal left atrial appendage without a thrombus. Dilated main pulmonary artery at 31 mm, suggestive of pulmonary hypertension. Posterior mitral annular calcification. Lipomatous hypertrophy of the interatrial septum. IMPRESSION: 1. Moderate to severe multivessel CAD, CADRADS = 4a. CT FFR will be performed and reported separately. 2. Coronary calcium score of 1970. This was 93rd percentile for age and sex  matched control. 3. Normal coronary origin with right dominance. 4. Dilated main pulmonary artery at 31 mm, suggestive of pulmonary hypertension. 5. Posterior mitral annular calcification. 6. Lipomatous hypertrophy of the interatrial septum. 7. Aortic atherosclerosis. 8. Definitive cardiac catheterization is recommended. Electronically Signed   By: Pixie Casino M.D.   On: 12/16/2021 13:36   Result Date: 12/16/2021 EXAM: OVER-READ INTERPRETATION  CT CHEST The following report is an over-read performed by radiologist Dr. Lorin Picket of [facility], [place] on 12/16/2021. This over-read does not include interpretation of cardiac or coronary anatomy or pathology. The coronary calcium score/coronary CTA interpretation by the cardiologist is attached. COMPARISON:  CT chest 09/06/2021. FINDINGS: Vascular: None. Mediastinum/Nodes: 12 mm left hilar lymph node is new (10/6). 11 mm right hilar and 10 mm subcarinal lymph nodes appear similar. Esophagus is grossly unremarkable. Lungs/Pleura: Scattered subsegmental volume loss. No pleural fluid. Debris is seen in the airway. Upper Abdomen: None. Musculoskeletal: No worrisome lytic or sclerotic lesions. IMPRESSION: Enlarged left hilar lymph node, new from 09/06/2021. Consider full CT chest with contrast now or in for 3-4 weeks in further evaluation, as malignancy cannot be excluded. These results will be called to the ordering clinician or representative by the Radiologist Assistant, and communication documented in the PACS or [employer]. Electronically Signed: By: Lorin Picket M.D. On: 12/16/2021 11:26   CT CORONARY FRACTIONAL FLOW  RESERVE DATA PREP  Result Date: 12/16/2021 EXAM: CT FFR ANALYSIS CLINICAL DATA:  Chest pain/anginal equiv, intermediate CAD risk, not treadmill candidate FINDINGS: FFRct analysis was performed on the original cardiac CT angiogram dataset. Diagrammatic representation of the FFRct analysis is provided in a separate PDF document in  PACS. This dictation was created using the PDF document and an interactive 3D model of the results. 3D model is not available in the EMR/PACS. Normal FFR range is >0.80. 1. Left Main:  No significant stenosis. FFR = 1.00 2. LAD: Significant stenosis. Proximal FFR = 1.00, Mid FFR = 0.83, Distal FFR = 0.72 (tapering) 3. LCX: Significant stenosis. Proximal FFR = 0.98, Distal FFR = 0.77 (tapering) 4. RCA: Significant stenosis. Proximal FFR = 0.99, Mid FFR = 0.77, Distal FFR = 0.77 IMPRESSION: 1. CT FFR analysis did show significant stenosis of the RCA, which is discrete in the proximal vessel and flow-limiting. 2. The are also mid to distal flow-limiting stenoses of the LAD and LCX, which taper toward the distal vessel, but may be significant. 3.  Definitive cardiac catheterization is recommended. Electronically Signed   By: Pixie Casino M.D.   On: 12/16/2021 13:44   ECHOCARDIOGRAM LIMITED  Result Date: 12/24/2021    ECHOCARDIOGRAM LIMITED REPORT   Patient Name:   Francisco Austin Date of Exam: 12/24/2021 Medical Rec #:  [id number]      Height:       71.0 in Accession #:    [id number]     Weight:       201.3 lb Date of Birth:  1951/07/27      BSA:          2.114 m Patient Age:    71 years       BP:           122/78 mmHg Patient Gender: M              HR:           69 bpm. Exam Location:  Inpatient Procedure: Limited Echo       STAT ECHO  Dr. Sallyanne Kuster in room. Indications:    STAT rule out Pericardial Effusion  History:        Patient has prior history of Echocardiogram examinations, most                 recent 04/06/2017. CAD; Risk Factors:Hypertension and                 Dyslipidemia.  Sonographer:    Bernadene Person RDCS Referring Phys: Veverly Fells  Conclusion(s)/Recommendation(s): Limited periprocedural study shows no evidence of pericardial effusion. Sanda Klein MD Electronically signed by Sanda Klein MD Signature Date/Time: 12/24/2021/3:37:28 PM    Final    Disposition   Pt is being discharged  home today in good condition.  Follow-up Plans & Appointments     Follow-up Information     Lorretta Harp, MD Follow up on 01/15/2022.   Specialties: Cardiology, Radiology Why: Appointment at 9:30 AM Contact information: [address] [place] [address] [phone]                Discharge Instructions     Amb Referral to Cardiac Rehabilitation   Complete by: As directed    Diagnosis:  Coronary Stents PTCA     After initial evaluation and assessments completed: Virtual Based Care may be provided alone or in conjunction with Phase 2 Cardiac Rehab based on patient barriers.:  Yes   Diet - low sodium heart healthy   Complete by: As directed    Discharge instructions   Complete by: As directed    Radial Site Care Refer to this sheet in the next few weeks. These instructions provide you with information on caring for yourself after your procedure. Your caregiver may also give you more specific instructions. Your treatment has been planned according to current medical practices, but problems sometimes occur. Call your caregiver if you have any problems or questions after your procedure. HOME CARE INSTRUCTIONS You may shower the day after the procedure. Remove the bandage (dressing) and gently wash the site with plain soap and water. Gently pat the site dry.  Do not apply powder or lotion to the site.  Do not submerge the affected site in water for 3 to 5 days.  Inspect the site at least twice daily.  Do not flex or bend the affected arm for 24 hours.  No lifting over 5 pounds (2.3 kg) for 5 days after your procedure.  Do not drive home if you are discharged the same day of the procedure. Have someone else drive you.  You may drive 24 hours after the procedure unless otherwise instructed by your caregiver.  What to expect: Any bruising will usually fade within 1 to 2 weeks.  Blood that collects in the tissue (hematoma) may be painful to the touch. It  should usually decrease in size and tenderness within 1 to 2 weeks.  SEEK IMMEDIATE MEDICAL CARE IF: You have unusual pain at the radial site.  You have redness, warmth, swelling, or pain at the radial site.  You have drainage (other than a small amount of blood on the dressing).  You have chills.  You have a fever or persistent symptoms for more than 72 hours.  You have a fever and your symptoms suddenly get worse.  Your arm becomes pale, cool, tingly, or numb.  You have heavy bleeding from the site. Hold pressure on the site.    Groin Site Care Refer to this sheet in the next few weeks. These instructions provide you with information on caring for yourself after your procedure. Your caregiver may also give you more specific instructions. Your treatment has been planned according to current medical practices, but problems sometimes occur. Call your caregiver if you have any problems or questions after your procedure. HOME CARE INSTRUCTIONS You may shower 24 hours after the procedure. Remove the bandage (dressing) and gently wash the site with plain soap and water. Gently pat the site dry.  Do not apply powder or lotion to the site.  Do not sit in a bathtub, swimming pool, or whirlpool for 5 to 7 days.  No bending, squatting, or lifting anything over 10 pounds (4.5 kg) as directed by your caregiver.  Inspect the site at least twice daily.  Do not drive home if you are discharged the same day of the procedure. Have someone else drive you.  You may drive 24 hours after the procedure unless otherwise instructed by your caregiver.  What to expect: Any bruising will usually fade within 1 to 2 weeks.  Blood that collects in the tissue (hematoma) may be painful to the touch. It should usually decrease in size and tenderness within 1 to 2 weeks.  SEEK IMMEDIATE MEDICAL CARE IF: You have unusual pain at the groin site or down the affected leg.  You have redness, warmth, swelling, or pain at the  groin site.  You  have drainage (other than a small amount of blood on the dressing).  You have chills.  You have a fever or persistent symptoms for more than 72 hours.  You have a fever and your symptoms suddenly get worse.  Your leg becomes pale, cool, tingly, or numb.  You have heavy bleeding from the site. Hold pressure on the site. Marland Kitchen   PLEASE DO NOT MISS ANY DOSES OF YOUR PLAVIX!!!!! Also keep a log of you blood pressures and bring back to your follow up appt. Please call the office with any questions.   Patients taking blood thinners should generally stay away from medicines like ibuprofen, Advil, Motrin, naproxen, and Aleve due to risk of stomach bleeding. You may take Tylenol as directed or talk to your primary doctor about alternatives.  Some studies suggest Prilosec/Omeprazole interacts with Plavix. We changed your Prilosec/Omeprazole to the equivalent dose of Protonix for less chance of interaction.  PLEASE ENSURE THAT YOU DO NOT RUN OUT OF YOUR PLAVIX. This medication is very important to remain on for at least one year. IF you have issues obtaining this medication due to cost please CALL the office 3-5 business days prior to running out in order to prevent missing doses of this medication.   Increase activity slowly   Complete by: As directed        Discharge Medications   Allergies as of 12/25/2021       Reactions   Gabapentin Anaphylaxis, Other (See Comments)   B/P dropped to "40"   Morphine Other (See Comments)   Burn his veins   Other Anaphylaxis   Androgenic Anabolic Steroid   Prednisone Anaphylaxis   Alprazolam Other (See Comments)   Other reaction(s): weirds him out   Niacin Other (See Comments)   Body feel weird   Penicillamine Hives        Medication List     TAKE these medications    albuterol 108 (90 Base) MCG/ACT inhaler Commonly known as: VENTOLIN HFA Inhale 2 puffs into the lungs every 6 (six) hours as needed.   aspirin EC 81 MG  tablet Take 81 mg by mouth daily.   atorvastatin 80 MG tablet Commonly known as: LIPITOR Take 1 tablet (80 mg total) by mouth daily.   Cholecalciferol 50 MCG (2000 UT) Tabs Take 2,000 Units by mouth daily.   clopidogrel 75 MG tablet Commonly known as: PLAVIX Take 1 tablet (75 mg total) by mouth daily with breakfast. Start taking on: December 26, 2021   diclofenac sodium 1 % Gel Commonly known as: VOLTAREN Apply 1 g topically daily as needed (pain).   diltiazem 240 MG 24 hr capsule Commonly known as: TIAZAC Take 240 mg by mouth daily.   esomeprazole 40 MG capsule Commonly known as: NEXIUM Take 40 mg by mouth daily.   HYDROcodone-acetaminophen 10-325 MG tablet Commonly known as: NORCO Take 1 tablet by mouth 5 (five) times daily.   LORazepam 1 MG tablet Commonly known as: ATIVAN Take 1 mg by mouth 3 (three) times daily.   nitroGLYCERIN 0.4 MG SL tablet Commonly known as: NITROSTAT 0.4 mg every 5 (five) minutes as needed for chest pain.   telmisartan 80 MG tablet Commonly known as: MICARDIS Take 80 mg by mouth daily.   umeclidinium-vilanterol 62.5-25 MCG/INH Aepb Commonly known as: ANORO ELLIPTA 1 puff daily.   vitamin B-12 500 MCG tablet Commonly known as: CYANOCOBALAMIN Take 500 mcg by mouth daily.           Outstanding Labs/Studies  Duration of Discharge Encounter   Greater than 30 minutes including physician time.  Signed, Janina Mayo, MD 12/25/2021, 11:23 AM  Agree with assessment of APP/PA Vikki Ports. I have reviewed all pertinent hemodynamic, laboratory, and cardiac studies.  71 y.o. male with a PMH of known CAD, HLD, HTN who was scheduled for an outpatient heart catheterization on 2/20 after a coronary cta suggested a high-grade calcified lesion in his proximal RCA. LHC on 2/20 showed a prox LAD lesion that was 90% stenosed and a complex proximal to mid RCA lesion that was 80% stenosed. Due to the complexity of the RCA lesion and  patient's tolerance of the procedure, elected to proceed with intervention on 2/21. He was loaded with plavix 600 mg. Continued on dapt. He was chest pain free with nitro paste. Otherwise, in normal rhythm, normal LV function. No NSVT. He was euvolemic. On 2/21, he underwent DES to the 90% prox LAD lesion.  RCA was significantly diseased from prox to mid. He underwent orbital atherectomy s/p DESx2 with synergy. He had TIMI III flow post procedure. He had some contrast dye behind the RCA overlapping stent, limited echo done showing no pericardial effusion. No signs of perforation. Otherwise plan for DAPT and lifelong plavix with significantly diseased RCA. He was mildly hypoxic with ambulation 88%, no symptoms. Can be re-assessed in follow-up. His right radial site is clean and dry. His significant chest pressure has resolved. LDL goal ideally < 55 mg/dL can repeat outpatient.  Vitals:   12/25/21 0443 12/25/21 0904  BP: 124/69 102/65  Pulse: 99 87  Resp: 18 18  Temp: 98.2 F (36.8 C) 97.9 F (36.6 C)  SpO2: 94% 92%   Physical Exam Gen: well appearing Neuro: alert and oriented CV: r,r,r no murmurs.  Vasc: right radial 2+ pulse Pulm: nl wob, CLAB Abd: non distended Ext: No LE edema Skin: warm and well perfused Psych: normal mood  Time Spent Directly with Patient:   I have spent a total of 30 minutes with the patient reviewing hospital notes, telemetry, EKGs, labs and examining the patient as well as establishing an assessment and plan that was discussed personally with the patient.  > 50% of time was spent in direct patient care.  1

## 2021-12-26 ENCOUNTER — Other Ambulatory Visit (HOSPITAL_COMMUNITY): Payer: Self-pay

## 2021-12-26 ENCOUNTER — Telehealth (HOSPITAL_COMMUNITY): Payer: Self-pay

## 2021-12-26 NOTE — Telephone Encounter (Signed)
Pharmacy Transitions of Care Follow-up Telephone Call  Date of discharge: 12/25/2021  Discharge Diagnosis: Stent Placement  How have you been since you were released from the hospital? Patient reports doing well.  No questions or concerns at this time.   Medication changes made at discharge:  - START: Clopidogrel 75 mg daily  Medication changes verified by the patient? Yes (Yes/No)  Medication Accessibility:  Home Pharmacy: [facility]   Was the patient provided with refills on discharged medications? Yes   Have all prescriptions been transferred from [facility] to home pharmacy? Yes   Is the patient able to afford medications? Patient has insurance   Medication Review: CLOPIDOGREL (PLAVIX) Clopidogrel 75 mg once daily.  - Educated patient on expected duration of therapy of  with clopidogrel.  - Advised patient of medications to avoid (NSAIDs, ASA)  - Educated that Tylenol (acetaminophen) will be the preferred analgesic to prevent risk of bleeding  - Emphasized importance of monitoring for signs and symptoms of bleeding (abnormal bruising, prolonged bleeding, nose bleeds, bleeding from gums, discolored urine, black tarry stools)  - Advised patient to alert all providers of anticoagulation therapy prior to starting a new medication or having a procedure   Follow-up Appointments: [facility] f/u appt confirmed? Cardiology Scheduled to see Dr. Gwenlyn Found on 01/15/2022 @ 9:30.   If their condition worsens, is the pt aware to call PCP or go to the Emergency Dept.? Yes  Final Patient Assessment: Patient was educated on clopidogrel, requesting active prescription be transferred to [facility], no questions or concerns at this time.

## 2022-01-06 ENCOUNTER — Encounter: Payer: Self-pay | Admitting: Cardiovascular Disease

## 2022-01-15 ENCOUNTER — Ambulatory Visit (INDEPENDENT_AMBULATORY_CARE_PROVIDER_SITE_OTHER): Payer: Medicare Other | Admitting: Cardiovascular Disease

## 2022-01-15 ENCOUNTER — Other Ambulatory Visit: Payer: Self-pay

## 2022-01-15 ENCOUNTER — Encounter: Payer: Self-pay | Admitting: Cardiovascular Disease

## 2022-01-15 DIAGNOSIS — E782 Mixed hyperlipidemia: Secondary | ICD-10-CM

## 2022-01-15 DIAGNOSIS — I10 Essential (primary) hypertension: Secondary | ICD-10-CM | POA: Diagnosis not present

## 2022-01-15 NOTE — Addendum Note (Signed)
Addended by: Beatrix Fetters on: 01/15/2022 09:56 AM ? ? Modules accepted: Orders ? ?

## 2022-01-15 NOTE — Assessment & Plan Note (Signed)
History of hyperlipidemia on high-dose statin therapy.  We will recheck a lipid liver profile in 2 months.  LDL goal is less than 70 given his CAD. ?

## 2022-01-15 NOTE — Assessment & Plan Note (Signed)
History of coronary artery disease with coronary CTA performed 12/16/2021 revealing a coronary calcium score of 1970 with moderate to severe multivessel disease.  Based on this I performed cardiac catheterization on him 12/23/2021 revealing high-grade calcified proximal LAD and segmentally calcified proximal dominant RCA.  Unfortunately, my catheter dipped into the LV, because of conduction abnormality with hypotension and I therefore aborted the intervention.  The next day Dr. Irish Lack placed a stent in the proximal LAD and did orbital atherectomy and stenting of his proximal to mid RCA.  There was some "tightening up" behind the stent with question of perforation although 2D echo did not show a pericardial effusion.  He was discharged on the following day on aspirin and clopidogrel.  His symptoms have completely resolved. ?

## 2022-01-15 NOTE — Progress Notes (Signed)
? ? ? ?01/15/2022 ?Francisco Austin   ?31-Jul-1951  ?[id number] ? ?Primary Physician [name], MD ?Primary Cardiologist: Lorretta Harp MD [name], [name] ? ?HPI:  Francisco Austin is a 71 y.o.  father of 2 children, with no grandchildren, former patient of Dr. Thurman Coyer, who was referred to me by Dr. [name] to be established in my practice because of known coronary artery disease.  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  I last saw him in the office 12/03/2021. He does have treated hypertension hyperlipidemia.  His mother [name] who died in January 20, 2017 was my patient as well.  He is never had a heart attack or stroke.  He is had several heart catheterizations Dr. [name] remotely with one PCI but no stent.  He does get frequent atypical chest pain.  His last 2D echo performed 04/06/2017 was essentially normal. ?  ?Because of ongoing chest pain I obtain a coronary CTA on 12/16/2021 that revealed a coronary calcium score of 1970 with moderate to severe multivessel disease.  Based on this I performed outpatient radial diagnostic cath on him 12/23/2021 revealing a high-grade calcified proximal LAD disease as well as proximal to mid RCA disease.  LV function was normal.  He did develop hypotension with a conduction abnormality when my TIG catheter across his aortic valve and therefore I aborted his intervention and staged the following day.  Dr. Irish Lack stented his proximal LAD and performed orbital atherectomy and stenting of his RCA.  He was discharged home the following day on aspirin and clopidogrel.  He feels clinically improved. ?  ? ? ?Current Meds  ?Medication Sig  ? albuterol (VENTOLIN HFA) 108 (90 Base) MCG/ACT inhaler Inhale 2 puffs into the lungs every 6 (six) hours as needed.  ? aspirin EC 81 MG tablet Take 81 mg by mouth daily.  ? atorvastatin (LIPITOR) 80 MG tablet Take 1 tablet (80 mg total) by mouth daily.  ? Cholecalciferol 50 MCG (2000 UT) TABS Take 2,000 Units by mouth daily.   ? clopidogrel (PLAVIX) 75 MG tablet Take 1 tablet (75 mg total) by mouth daily with breakfast.  ? clopidogrel (PLAVIX) 75 MG tablet 1 tablet with breakfast  ? diclofenac sodium (VOLTAREN) 1 % GEL Apply 1 g topically daily as needed (pain).  ? diltiazem (TIAZAC) 240 MG 24 hr capsule Take 240 mg by mouth daily.  ? esomeprazole (NEXIUM) 40 MG capsule Take 40 mg by mouth daily.  ? HYDROcodone-acetaminophen (NORCO) 10-325 MG tablet Take 1 tablet by mouth 5 (five) times daily.  ? LORazepam (ATIVAN) 1 MG tablet Take 1 mg by mouth 3 (three) times daily.  ? nitroGLYCERIN (NITROSTAT) 0.4 MG SL tablet 0.4 mg every 5 (five) minutes as needed for chest pain.  ? telmisartan (MICARDIS) 80 MG tablet Take 80 mg by mouth daily.  ? umeclidinium-vilanterol (ANORO ELLIPTA) 62.5-25 MCG/INH AEPB 1 puff daily.  ? vitamin B-12 (CYANOCOBALAMIN) 500 MCG tablet Take 500 mcg by mouth daily.  ? ?Current Facility-Administered Medications for the 01/15/22 encounter (Office Visit) with Lorretta Harp, MD  ?Medication  ? sodium chloride flush (NS) 0.9 % injection 3 mL  ?  ? ?Allergies  ?Allergen Reactions  ? Gabapentin Anaphylaxis and Other (See Comments)  ?  B/P dropped to "40" ?  ? Morphine Other (See Comments)  ?  Burn his veins  ? Other Anaphylaxis  ?  Androgenic Anabolic Steroid  ? Prednisone Anaphylaxis  ? Alprazolam Other (See  Comments)  ?  Other reaction(s): weirds him out  ? Niacin Other (See Comments)  ?  Body feel weird  ? Penicillamine Hives  ? ? ?Social History  ? ?Socioeconomic History  ? Marital status: Married  ?  Spouse name: Not on file  ? Number of children: Not on file  ? Years of education: Not on file  ? Highest education level: Not on file  ?Occupational History  ? Not on file  ?Tobacco Use  ? Smoking status: Every Day  ?  Types: Pipe  ? Smokeless tobacco: Never  ?Vaping Use  ? Vaping Use: Never used  ?Substance and Sexual Activity  ? Alcohol use: Never  ? Drug use: Never  ? Sexual activity: Not Currently  ?Other Topics  Concern  ? Not on file  ?Social History Narrative  ? Not on file  ? ?Social Determinants of Health  ? ?Financial Resource Strain: Not on file  ?Food Insecurity: Not on file  ?Transportation Needs: Not on file  ?Physical Activity: Not on file  ?Stress: Not on file  ?Social Connections: Not on file  ?Intimate Partner Violence: Not on file  ?  ? ?Review of Systems: ?General: negative for chills, fever, night sweats or weight changes.  ?Cardiovascular: negative for chest pain, dyspnea on exertion, edema, orthopnea, palpitations, paroxysmal nocturnal dyspnea or shortness of breath ?Dermatological: negative for rash ?Respiratory: negative for cough or wheezing ?Urologic: negative for hematuria ?Abdominal: negative for nausea, vomiting, diarrhea, bright red blood per rectum, melena, or hematemesis ?Neurologic: negative for visual changes, syncope, or dizziness ?All other systems reviewed and are otherwise negative except as noted above. ? ? ? ?Blood pressure 116/60, pulse 75, height 5\' 11"  (1.803 m), weight 206 lb (93.4 kg), SpO2 94 %.  ?General appearance: alert and no distress ?Neck: no adenopathy, no carotid bruit, no JVD, supple, symmetrical, trachea midline, and thyroid not enlarged, symmetric, no tenderness/mass/nodules ?Lungs: clear to auscultation bilaterally ?Heart: regular rate and rhythm, S1, S2 normal, no murmur, click, rub or gallop ?Extremities: extremities normal, atraumatic, no cyanosis or edema ?Pulses: 2+ and symmetric ?Skin: Skin color, texture, turgor normal. No rashes or lesions ?Neurologic: Grossly normal ? ?EKG not performed today ? ?ASSESSMENT AND PLAN:  ? ?Essential hypertension ?History of essential hypertension a blood pressure measured today at 116/60.  He is on diltiazem and Micardis. ? ?Hyperlipidemia ?History of hyperlipidemia on high-dose statin therapy.  We will recheck a lipid liver profile in 2 months.  LDL goal is less than 70 given his CAD. ? ?CAD (coronary artery disease) ?History of  coronary artery disease with coronary CTA performed 12/16/2021 revealing a coronary calcium score of 1970 with moderate to severe multivessel disease.  Based on this I performed cardiac catheterization on him 12/23/2021 revealing high-grade calcified proximal LAD and segmentally calcified proximal dominant RCA.  Unfortunately, my catheter dipped into the LV, because of conduction abnormality with hypotension and I therefore aborted the intervention.  The next day Dr. Irish Lack placed a stent in the proximal LAD and did orbital atherectomy and stenting of his proximal to mid RCA.  There was some "tightening up" behind the stent with question of perforation although 2D echo did not show a pericardial effusion.  He was discharged on the following day on aspirin and clopidogrel.  His symptoms have completely resolved. ? ? ? ? ?Lorretta Harp MD FACP,FACC,FAHA, FSCAI ?01/15/2022 ?9:50 AM ?

## 2022-01-15 NOTE — Patient Instructions (Signed)
Medication Instructions:  ?Your physician recommends that you continue on your current medications as directed. Please refer to the Current Medication list given to you today. ? ?*If you need a refill on your cardiac medications before your next appointment, please call your pharmacy* ? ? ?Lab Work: ?Your physician recommends that you return for lab work in: 2 months for FASTING lipid/liver profile. ? ?If you have labs (blood work) drawn today and your tests are completely normal, you will receive your results only by: ?MyChart Message (if you have MyChart) OR ?A paper copy in the mail ?If you have any lab test that is abnormal or we need to change your treatment, we will call you to review the results. ? ? ?Follow-Up: ?At [facility], you and your health needs are our priority.  As part of our continuing mission to provide you with exceptional heart care, we have created designated Provider Care Teams.  These Care Teams include your primary Cardiologist (physician) and Advanced Practice Providers (APPs -  Physician Assistants and Nurse Practitioners) who all work together to provide you with the care you need, when you need it. ? ?We recommend signing up for the patient portal called "MyChart".  Sign up information is provided on this After Visit Summary.  MyChart is used to connect with patients for Virtual Visits (Telemedicine).  Patients are able to view lab/test results, encounter notes, upcoming appointments, etc.  Non-urgent messages can be sent to your provider as well.   ?To learn more about what you can do with MyChart, go to [email or web address].   ? ?Your next appointment:   ?6 month(s) ? ?The format for your next appointment:   ?In Person ? ?Provider:   ?Quay Burow, MD  ?

## 2022-01-15 NOTE — Assessment & Plan Note (Signed)
History of essential hypertension a blood pressure measured today at 116/60.  He is on diltiazem and Micardis. ?

## 2022-01-17 DIAGNOSIS — K219 Gastro-esophageal reflux disease without esophagitis: Secondary | ICD-10-CM | POA: Diagnosis not present

## 2022-01-17 DIAGNOSIS — I2510 Atherosclerotic heart disease of native coronary artery without angina pectoris: Secondary | ICD-10-CM | POA: Diagnosis not present

## 2022-01-17 DIAGNOSIS — G894 Chronic pain syndrome: Secondary | ICD-10-CM | POA: Diagnosis not present

## 2022-01-17 DIAGNOSIS — M150 Primary generalized (osteo)arthritis: Secondary | ICD-10-CM | POA: Diagnosis not present

## 2022-01-17 DIAGNOSIS — E1169 Type 2 diabetes mellitus with other specified complication: Secondary | ICD-10-CM | POA: Diagnosis not present

## 2022-01-17 DIAGNOSIS — I10 Essential (primary) hypertension: Secondary | ICD-10-CM | POA: Diagnosis not present

## 2022-01-17 DIAGNOSIS — E7800 Pure hypercholesterolemia, unspecified: Secondary | ICD-10-CM | POA: Diagnosis not present

## 2022-01-24 ENCOUNTER — Other Ambulatory Visit: Payer: Self-pay | Admitting: Family Medicine

## 2022-01-24 DIAGNOSIS — R590 Localized enlarged lymph nodes: Secondary | ICD-10-CM

## 2022-01-27 DIAGNOSIS — E1169 Type 2 diabetes mellitus with other specified complication: Secondary | ICD-10-CM | POA: Diagnosis not present

## 2022-01-27 DIAGNOSIS — I10 Essential (primary) hypertension: Secondary | ICD-10-CM | POA: Diagnosis not present

## 2022-01-27 DIAGNOSIS — E7800 Pure hypercholesterolemia, unspecified: Secondary | ICD-10-CM | POA: Diagnosis not present

## 2022-01-29 ENCOUNTER — Ambulatory Visit
Admission: RE | Admit: 2022-01-29 | Discharge: 2022-01-29 | Disposition: A | Discharge status: Home / Self Care | Payer: Medicare Other | Source: Ambulatory Visit | Attending: Family Medicine | Admitting: Family Medicine

## 2022-01-29 DIAGNOSIS — I700 Atherosclerosis of aorta: Secondary | ICD-10-CM | POA: Diagnosis not present

## 2022-01-29 DIAGNOSIS — R590 Localized enlarged lymph nodes: Secondary | ICD-10-CM

## 2022-01-29 DIAGNOSIS — J432 Centrilobular emphysema: Secondary | ICD-10-CM | POA: Diagnosis not present

## 2022-01-29 DIAGNOSIS — I2510 Atherosclerotic heart disease of native coronary artery without angina pectoris: Secondary | ICD-10-CM | POA: Diagnosis not present

## 2022-01-29 DIAGNOSIS — N200 Calculus of kidney: Secondary | ICD-10-CM | POA: Diagnosis not present

## 2022-01-29 MED ORDER — IOPAMIDOL (ISOVUE-300) INJECTION 61%
75.0000 mL | Freq: Once | INTRAVENOUS | Status: AC | PRN
  Administered 2022-01-29: 75 mL via INTRAVENOUS

## 2022-01-31 DIAGNOSIS — E1169 Type 2 diabetes mellitus with other specified complication: Secondary | ICD-10-CM | POA: Diagnosis not present

## 2022-01-31 DIAGNOSIS — I2510 Atherosclerotic heart disease of native coronary artery without angina pectoris: Secondary | ICD-10-CM | POA: Diagnosis not present

## 2022-01-31 DIAGNOSIS — E7800 Pure hypercholesterolemia, unspecified: Secondary | ICD-10-CM | POA: Diagnosis not present

## 2022-01-31 DIAGNOSIS — I10 Essential (primary) hypertension: Secondary | ICD-10-CM | POA: Diagnosis not present

## 2022-01-31 DIAGNOSIS — F17200 Nicotine dependence, unspecified, uncomplicated: Secondary | ICD-10-CM | POA: Diagnosis not present

## 2022-01-31 DIAGNOSIS — R590 Localized enlarged lymph nodes: Secondary | ICD-10-CM | POA: Diagnosis not present

## 2022-01-31 DIAGNOSIS — G894 Chronic pain syndrome: Secondary | ICD-10-CM | POA: Diagnosis not present

## 2022-02-03 ENCOUNTER — Other Ambulatory Visit (HOSPITAL_COMMUNITY): Payer: Self-pay | Admitting: Family Medicine

## 2022-02-03 ENCOUNTER — Other Ambulatory Visit: Payer: Self-pay | Admitting: Family Medicine

## 2022-02-03 DIAGNOSIS — R590 Localized enlarged lymph nodes: Secondary | ICD-10-CM

## 2022-02-06 DIAGNOSIS — U071 COVID-19: Secondary | ICD-10-CM | POA: Diagnosis not present

## 2022-02-12 DIAGNOSIS — I10 Essential (primary) hypertension: Secondary | ICD-10-CM | POA: Diagnosis not present

## 2022-02-12 DIAGNOSIS — E7800 Pure hypercholesterolemia, unspecified: Secondary | ICD-10-CM | POA: Diagnosis not present

## 2022-02-12 DIAGNOSIS — E1169 Type 2 diabetes mellitus with other specified complication: Secondary | ICD-10-CM | POA: Diagnosis not present

## 2022-02-19 ENCOUNTER — Encounter (HOSPITAL_COMMUNITY): Payer: Medicare Other

## 2022-02-19 ENCOUNTER — Encounter (HOSPITAL_COMMUNITY): Payer: Self-pay

## 2022-02-26 ENCOUNTER — Ambulatory Visit (HOSPITAL_COMMUNITY)
Admission: RE | Admit: 2022-02-26 | Discharge: 2022-02-26 | Disposition: A | Discharge status: Home / Self Care | Payer: Medicare Other | Source: Ambulatory Visit | Attending: Family Medicine | Admitting: Family Medicine

## 2022-02-26 DIAGNOSIS — N200 Calculus of kidney: Secondary | ICD-10-CM | POA: Diagnosis not present

## 2022-02-26 DIAGNOSIS — R590 Localized enlarged lymph nodes: Secondary | ICD-10-CM | POA: Insufficient documentation

## 2022-02-26 DIAGNOSIS — J984 Other disorders of lung: Secondary | ICD-10-CM | POA: Diagnosis not present

## 2022-02-26 DIAGNOSIS — J9811 Atelectasis: Secondary | ICD-10-CM | POA: Diagnosis not present

## 2022-02-26 LAB — GLUCOSE, CAPILLARY: Glucose-Capillary: 109 mg/dL — ABNORMAL HIGH (ref 70–99)

## 2022-02-26 MED ORDER — FLUDEOXYGLUCOSE F - 18 (FDG) INJECTION
10.2000 | Freq: Once | INTRAVENOUS | Status: AC | PRN
  Administered 2022-02-26: 10 via INTRAVENOUS

## 2022-02-28 ENCOUNTER — Telehealth (HOSPITAL_COMMUNITY): Payer: Self-pay

## 2022-02-28 NOTE — Telephone Encounter (Signed)
Called and spoke with pt in regards to CR, pt stated he is not interested at this time.   Closed referral 

## 2022-03-03 DIAGNOSIS — E782 Mixed hyperlipidemia: Secondary | ICD-10-CM | POA: Diagnosis not present

## 2022-03-03 DIAGNOSIS — C3412 Malignant neoplasm of upper lobe, left bronchus or lung: Secondary | ICD-10-CM

## 2022-03-03 HISTORY — DX: Malignant neoplasm of upper lobe, left bronchus or lung: C34.12

## 2022-03-03 LAB — LIPID PANEL
Chol/HDL Ratio: 2.9 ratio (ref 0.0–5.0)
Cholesterol, Total: 115 mg/dL (ref 100–199)
HDL: 40 mg/dL (ref >39)
LDL Chol Calc (NIH): 61 mg/dL (ref 0–99)
Triglycerides: 67 mg/dL (ref 0–149)
VLDL Cholesterol Cal: 14 mg/dL (ref 5–40)

## 2022-03-03 LAB — HEPATIC FUNCTION PANEL
ALT: 17 IU/L (ref 0–44)
AST: 17 IU/L (ref 0–40)
Albumin: 4.2 g/dL (ref 3.8–4.8)
Alkaline Phosphatase: 98 IU/L (ref 44–121)
Bilirubin Total: 0.3 mg/dL (ref 0.0–1.2)
Bilirubin, Direct: <0.1 mg/dL (ref 0.00–0.40)
Total Protein: 6.6 g/dL (ref 6.0–8.5)

## 2022-03-04 ENCOUNTER — Encounter: Payer: Self-pay | Admitting: Thoracic Surgery (Cardiothoracic Vascular Surgery)

## 2022-03-04 ENCOUNTER — Institutional Professional Consult (permissible substitution) (INDEPENDENT_AMBULATORY_CARE_PROVIDER_SITE_OTHER): Payer: Medicare Other | Admitting: Thoracic Surgery (Cardiothoracic Vascular Surgery)

## 2022-03-04 ENCOUNTER — Other Ambulatory Visit: Payer: Self-pay | Admitting: *Deleted

## 2022-03-04 ENCOUNTER — Encounter: Payer: Self-pay | Admitting: *Deleted

## 2022-03-04 VITALS — BP 116/74 | HR 82 | Resp 20 | Ht 71.0 in | Wt 200.0 lb

## 2022-03-04 DIAGNOSIS — R590 Localized enlarged lymph nodes: Secondary | ICD-10-CM

## 2022-03-04 NOTE — H&P (View-Only) (Signed)
PCP is [name], MD ?Referring Provider is [name], MD ? ?Chief Complaint  ?Patient presents with  ? Consult  ?  Initial surgical consult, Cardia CT 2/14, Chest CT 3/29, PET 4/26  ? ? ?HPI: Francisco Austin is sent for consultation regarding left hilar adenopathy. ? ?Francisco Austin is a 71 year old man with a history of tobacco abuse, hypertension, hyperlipidemia, CAD, arthritis, COPD, and anxiety.  Francisco Austin smokes cigarettes when Francisco Austin was younger.  For the past 30 years or so Francisco Austin has smoked a pipe.  Francisco Austin was having issues with atypical chest pain.  In February Francisco Austin underwent angioplasty and placement of 3 stents in his coronaries.  Francisco Austin is on Plavix. ? ?During his evaluation for that Francisco Austin was noted to have fullness in his left hilum on coronary CT.  A CT of the chest confirmed left hilar adenopathy which was new from a CT in November as well as stable left paratracheal adenopathy.  PET/CT showed the nodule was markedly hypermetabolic.  There was mild uptake in the paratracheal node.  There is no dominant lung mass although there is a small cavitary lesion in the left upper lobe. ? ?Francisco Austin has lost about 14 pounds in the last 3 months.  His chest pain has improved since stents were placed.  Francisco Austin does have shortness of breath on exertion.  That also has improved since stenting.  Francisco Austin does have a productive cough and occasional wheezing. ? ?Francisco Austin: ?At the time of surgery this patient?s most appropriate activity status/level should be described as: ?[]     0    Normal activity, no symptoms ?[x]     1    Restricted in physical strenuous activity but ambulatory, able to do out light work ?[]     2    Ambulatory and capable of self care, unable to do work activities, up and about >50 % of waking hours                              ?[]     3    Only limited self care, in bed greater than 50% of waking hours ?[]     4    Completely disabled, no self care, confined to bed or chair ?[]     5    Moribund ? ?Past Medical History:  ?Diagnosis  Date  ? Back pain   ? CAD (coronary artery disease)   ? HLD (hyperlipidemia)   ? HTN (hypertension)   ? ? ?Past Surgical History:  ?Procedure Laterality Date  ? APPENDECTOMY    ? CORONARY STENT INTERVENTION N/A 12/24/2021  ? Procedure: CORONARY STENT INTERVENTION;  Surgeon: Jettie Booze, MD;  Location: [facility];  Service: Cardiovascular;  Laterality: N/A;  ? HEMORROIDECTOMY    ? INTRAVASCULAR ULTRASOUND/IVUS N/A 12/24/2021  ? Procedure: Intravascular Ultrasound/IVUS;  Surgeon: Jettie Booze, MD;  Location: [facility];  Service: Cardiovascular;  Laterality: N/A;  ? LEFT HEART CATH AND CORONARY ANGIOGRAPHY N/A 12/23/2021  ? Procedure: LEFT HEART CATH AND CORONARY ANGIOGRAPHY;  Surgeon: Lorretta Harp, MD;  Location: [facility];  Service: Cardiovascular;  Laterality: N/A;  ? TONSILLECTOMY    ? ? ?Family History  ?Problem Relation Age of Onset  ? Heart failure Mother   ? Cancer Father   ? ? ?Social History ?Social History  ? ?Tobacco Use  ? Smoking status: Every Day  ?  Types: Pipe  ? Smokeless tobacco:  Never  ?Vaping Use  ? Vaping Use: Never used  ?Substance Use Topics  ? Alcohol use: Never  ? Drug use: Never  ? ? ?Current Outpatient Medications  ?Medication Sig Dispense Refill  ? albuterol (VENTOLIN HFA) 108 (90 Base) MCG/ACT inhaler Inhale 2 puffs into the lungs every 6 (six) hours as needed.    ? aspirin EC 81 MG tablet Take 81 mg by mouth daily.    ? atorvastatin (LIPITOR) 80 MG tablet Take 1 tablet (80 mg total) by mouth daily. 90 tablet 3  ? Cholecalciferol 50 MCG (2000 UT) TABS Take 2,000 Units by mouth daily.    ? clopidogrel (PLAVIX) 75 MG tablet Take 1 tablet (75 mg total) by mouth daily with breakfast. 90 tablet 3  ? clopidogrel (PLAVIX) 75 MG tablet 1 tablet with breakfast    ? diclofenac sodium (VOLTAREN) 1 % GEL Apply 1 g topically daily as needed (pain).    ? diltiazem (TIAZAC) 240 MG 24 hr capsule Take 240 mg by mouth daily.    ? esomeprazole (NEXIUM) 40 MG  capsule Take 40 mg by mouth daily.    ? HYDROcodone-acetaminophen (NORCO) 10-325 MG tablet Take 1 tablet by mouth 5 (five) times daily.    ? LORazepam (ATIVAN) 1 MG tablet Take 1 mg by mouth 3 (three) times daily.    ? nitroGLYCERIN (NITROSTAT) 0.4 MG SL tablet 0.4 mg every 5 (five) minutes as needed for chest pain.    ? telmisartan (MICARDIS) 80 MG tablet Take 80 mg by mouth daily.    ? umeclidinium-vilanterol (ANORO ELLIPTA) 62.5-25 MCG/INH AEPB 1 puff daily.    ? vitamin B-12 (CYANOCOBALAMIN) 500 MCG tablet Take 500 mcg by mouth daily.    ? ?No current facility-administered medications for this visit.  ? ? ?Allergies  ?Allergen Reactions  ? Gabapentin Anaphylaxis and Other (See Comments)  ?  B/P dropped to "40" ?  ? Morphine Other (See Comments)  ?  Burn his veins  ? Other Anaphylaxis  ?  Androgenic Anabolic Steroid  ? Prednisone Anaphylaxis  ? Alprazolam Other (See Comments)  ?  Other reaction(s): weirds him out  ? Niacin Other (See Comments)  ?  Body feel weird  ? Penicillamine Hives  ? ? ?Review of Systems  ?Constitutional:  Positive for activity change and unexpected weight change. Negative for appetite change and fever.  ?HENT:  Negative for trouble swallowing and voice change.   ?Respiratory:  Positive for cough, shortness of breath and wheezing.   ?Cardiovascular:  Positive for chest pain.  ?Gastrointestinal:  Positive for abdominal pain (Reflux).  ?Genitourinary:  Positive for frequency.  ?Musculoskeletal:  Positive for arthralgias and joint swelling.  ?Neurological:  Positive for dizziness and headaches.  ?     Chronic pain  ?Hematological:  Bruises/bleeds easily.  ?Psychiatric/Behavioral:  The patient is nervous/anxious.   ? ?BP 116/74 (BP Location: Right Arm, Patient Position: Sitting)   Pulse 82   Resp 20   Ht 5\' 11"  (1.803 m)   Wt 200 lb (90.7 kg)   SpO2 90% Comment: RA  BMI 27.89 kg/m?  ?Physical Exam ?Vitals reviewed.  ?Constitutional:   ?   General: Francisco Austin is not in acute distress. ?    Appearance: Normal appearance.  ?HENT:  ?   Head: Normocephalic and atraumatic.  ?Neck:  ?   Vascular: No carotid bruit.  ?Cardiovascular:  ?   Rate and Rhythm: Normal rate and regular rhythm.  ?   Heart sounds: Normal heart sounds.  No murmur heard. ?  No friction rub. No gallop.  ?Pulmonary:  ?   Effort: Pulmonary effort is normal. No respiratory distress.  ?   Breath sounds: Wheezing (Faint at bases) present. No rales.  ?Abdominal:  ?   General: There is no distension.  ?   Palpations: Abdomen is soft.  ?Musculoskeletal:  ?   Cervical back: Neck supple.  ?Lymphadenopathy:  ?   Cervical: No cervical adenopathy.  ?Skin: ?   General: Skin is warm and dry.  ?Neurological:  ?   General: No focal deficit present.  ?   Mental Status: Francisco Austin is alert and oriented to person, place, and time.  ?   Cranial Nerves: No cranial nerve deficit.  ?   Motor: No weakness.  ? ? ?Diagnostic Tests: ?CT CHEST WITH CONTRAST ?  ?TECHNIQUE: ?Multidetector CT imaging of the chest was performed during ?intravenous contrast administration. ?  ?RADIATION DOSE REDUCTION: This exam was performed according to the ?departmental dose-optimization program which includes automated ?exposure control, adjustment of the mA and/or kV according to ?patient size and/or use of iterative reconstruction technique. ?  ?CONTRAST:  33mL ISOVUE-300 IOPAMIDOL (ISOVUE-300) INJECTION 61% ?  ?COMPARISON:  Cardiac CT 12/16/2021.  Chest CT 09/06/2021. ?  ?FINDINGS: ?Cardiovascular: No acute vascular findings are seen. There are ?extensive coronary artery calcifications with lesser involvement of ?the aorta and great vessels. The heart size is normal. There is no ?pericardial effusion. ?  ?Mediastinum/Nodes: The enlarged left hilar lymph node seen on the ?cardiac CT persists, measuring 2.5 x 1.7 cm on image 76/2 (1.9 x 1.3 ?cm on CT 12/16/2021 and newly enlarged from 09/06/2021). Additional ?small mediastinal lymph nodes, including a 1.3 cm AP window node on ?image 61/2 and  a subcarinal node measuring 1.3 cm on image 76/2 are ?stable from the 09/06/2021 examination. The thyroid gland, trachea ?and esophagus demonstrate no significant findings. ?  ?Lungs/Pleura: No pleural effusion or pneumoth

## 2022-03-04 NOTE — Progress Notes (Signed)
PCP is [name], MD ?Referring Provider is [name], MD ? ?Chief Complaint  ?Patient presents with  ? Consult  ?  Initial surgical consult, Cardia CT 2/14, Chest CT 3/29, PET 4/26  ? ? ?HPI: Francisco Austin is sent for consultation regarding left hilar adenopathy. ? ?Francisco Austin is a 71 year old man with a history of tobacco abuse, hypertension, hyperlipidemia, CAD, arthritis, COPD, and anxiety.  Francisco Austin smokes cigarettes when Francisco Austin was younger.  For the past 30 years or so Francisco Austin has smoked a pipe.  Francisco Austin was having issues with atypical chest pain.  In February Francisco Austin underwent angioplasty and placement of 3 stents in his coronaries.  Francisco Austin is on Plavix. ? ?During his evaluation for that Francisco Austin was noted to have fullness in his left hilum on coronary CT.  A CT of the chest confirmed left hilar adenopathy which was new from a CT in November as well as stable left paratracheal adenopathy.  PET/CT showed the nodule was markedly hypermetabolic.  There was mild uptake in the paratracheal node.  There is no dominant lung mass although there is a small cavitary lesion in the left upper lobe. ? ?Francisco Austin has lost about 14 pounds in the last 3 months.  His chest pain has improved since stents were placed.  Francisco Austin does have shortness of breath on exertion.  That also has improved since stenting.  Francisco Austin does have a productive cough and occasional wheezing. ? ?Zubrod Score: ?At the time of surgery this patient?s most appropriate activity status/level should be described as: ?[]     0    Normal activity, no symptoms ?[x]     1    Restricted in physical strenuous activity but ambulatory, able to do out light work ?[]     2    Ambulatory and capable of self care, unable to do work activities, up and about >50 % of waking hours                              ?[]     3    Only limited self care, in bed greater than 50% of waking hours ?[]     4    Completely disabled, no self care, confined to bed or chair ?[]     5    Moribund ? ?Past Medical History:  ?Diagnosis  Date  ? Back pain   ? CAD (coronary artery disease)   ? HLD (hyperlipidemia)   ? HTN (hypertension)   ? ? ?Past Surgical History:  ?Procedure Laterality Date  ? APPENDECTOMY    ? CORONARY STENT INTERVENTION N/A 12/24/2021  ? Procedure: CORONARY STENT INTERVENTION;  Surgeon: Jettie Booze, MD;  Location: [facility];  Service: Cardiovascular;  Laterality: N/A;  ? HEMORROIDECTOMY    ? INTRAVASCULAR ULTRASOUND/IVUS N/A 12/24/2021  ? Procedure: Intravascular Ultrasound/IVUS;  Surgeon: Jettie Booze, MD;  Location: [facility];  Service: Cardiovascular;  Laterality: N/A;  ? LEFT HEART CATH AND CORONARY ANGIOGRAPHY N/A 12/23/2021  ? Procedure: LEFT HEART CATH AND CORONARY ANGIOGRAPHY;  Surgeon: Lorretta Harp, MD;  Location: [facility];  Service: Cardiovascular;  Laterality: N/A;  ? TONSILLECTOMY    ? ? ?Family History  ?Problem Relation Age of Onset  ? Heart failure Mother   ? Cancer Father   ? ? ?Social History ?Social History  ? ?Tobacco Use  ? Smoking status: Every Day  ?  Types: Pipe  ? Smokeless tobacco:  Never  ?Vaping Use  ? Vaping Use: Never used  ?Substance Use Topics  ? Alcohol use: Never  ? Drug use: Never  ? ? ?Current Outpatient Medications  ?Medication Sig Dispense Refill  ? albuterol (VENTOLIN HFA) 108 (90 Base) MCG/ACT inhaler Inhale 2 puffs into the lungs every 6 (six) hours as needed.    ? aspirin EC 81 MG tablet Take 81 mg by mouth daily.    ? atorvastatin (LIPITOR) 80 MG tablet Take 1 tablet (80 mg total) by mouth daily. 90 tablet 3  ? Cholecalciferol 50 MCG (2000 UT) TABS Take 2,000 Units by mouth daily.    ? clopidogrel (PLAVIX) 75 MG tablet Take 1 tablet (75 mg total) by mouth daily with breakfast. 90 tablet 3  ? clopidogrel (PLAVIX) 75 MG tablet 1 tablet with breakfast    ? diclofenac sodium (VOLTAREN) 1 % GEL Apply 1 g topically daily as needed (pain).    ? diltiazem (TIAZAC) 240 MG 24 hr capsule Take 240 mg by mouth daily.    ? esomeprazole (NEXIUM) 40 MG  capsule Take 40 mg by mouth daily.    ? HYDROcodone-acetaminophen (NORCO) 10-325 MG tablet Take 1 tablet by mouth 5 (five) times daily.    ? LORazepam (ATIVAN) 1 MG tablet Take 1 mg by mouth 3 (three) times daily.    ? nitroGLYCERIN (NITROSTAT) 0.4 MG SL tablet 0.4 mg every 5 (five) minutes as needed for chest pain.    ? telmisartan (MICARDIS) 80 MG tablet Take 80 mg by mouth daily.    ? umeclidinium-vilanterol (ANORO ELLIPTA) 62.5-25 MCG/INH AEPB 1 puff daily.    ? vitamin B-12 (CYANOCOBALAMIN) 500 MCG tablet Take 500 mcg by mouth daily.    ? ?No current facility-administered medications for this visit.  ? ? ?Allergies  ?Allergen Reactions  ? Gabapentin Anaphylaxis and Other (See Comments)  ?  B/P dropped to "40" ?  ? Morphine Other (See Comments)  ?  Burn his veins  ? Other Anaphylaxis  ?  Androgenic Anabolic Steroid  ? Prednisone Anaphylaxis  ? Alprazolam Other (See Comments)  ?  Other reaction(s): weirds him out  ? Niacin Other (See Comments)  ?  Body feel weird  ? Penicillamine Hives  ? ? ?Review of Systems  ?Constitutional:  Positive for activity change and unexpected weight change. Negative for appetite change and fever.  ?HENT:  Negative for trouble swallowing and voice change.   ?Respiratory:  Positive for cough, shortness of breath and wheezing.   ?Cardiovascular:  Positive for chest pain.  ?Gastrointestinal:  Positive for abdominal pain (Reflux).  ?Genitourinary:  Positive for frequency.  ?Musculoskeletal:  Positive for arthralgias and joint swelling.  ?Neurological:  Positive for dizziness and headaches.  ?     Chronic pain  ?Hematological:  Bruises/bleeds easily.  ?Psychiatric/Behavioral:  The patient is nervous/anxious.   ? ?BP 116/74 (BP Location: Right Arm, Patient Position: Sitting)   Pulse 82   Resp 20   Ht 5\' 11"  (1.803 m)   Wt 200 lb (90.7 kg)   SpO2 90% Comment: RA  BMI 27.89 kg/m?  ?Physical Exam ?Vitals reviewed.  ?Constitutional:   ?   General: Francisco Austin is not in acute distress. ?    Appearance: Normal appearance.  ?HENT:  ?   Head: Normocephalic and atraumatic.  ?Neck:  ?   Vascular: No carotid bruit.  ?Cardiovascular:  ?   Rate and Rhythm: Normal rate and regular rhythm.  ?   Heart sounds: Normal heart sounds.  No murmur heard. ?  No friction rub. No gallop.  ?Pulmonary:  ?   Effort: Pulmonary effort is normal. No respiratory distress.  ?   Breath sounds: Wheezing (Faint at bases) present. No rales.  ?Abdominal:  ?   General: There is no distension.  ?   Palpations: Abdomen is soft.  ?Musculoskeletal:  ?   Cervical back: Neck supple.  ?Lymphadenopathy:  ?   Cervical: No cervical adenopathy.  ?Skin: ?   General: Skin is warm and dry.  ?Neurological:  ?   General: No focal deficit present.  ?   Mental Status: Francisco Austin is alert and oriented to person, place, and time.  ?   Cranial Nerves: No cranial nerve deficit.  ?   Motor: No weakness.  ? ? ?Diagnostic Tests: ?CT CHEST WITH CONTRAST ?  ?TECHNIQUE: ?Multidetector CT imaging of the chest was performed during ?intravenous contrast administration. ?  ?RADIATION DOSE REDUCTION: This exam was performed according to the ?departmental dose-optimization program which includes automated ?exposure control, adjustment of the mA and/or kV according to ?patient size and/or use of iterative reconstruction technique. ?  ?CONTRAST:  69mL ISOVUE-300 IOPAMIDOL (ISOVUE-300) INJECTION 61% ?  ?COMPARISON:  Cardiac CT 12/16/2021.  Chest CT 09/06/2021. ?  ?FINDINGS: ?Cardiovascular: No acute vascular findings are seen. There are ?extensive coronary artery calcifications with lesser involvement of ?the aorta and great vessels. The heart size is normal. There is no ?pericardial effusion. ?  ?Mediastinum/Nodes: The enlarged left hilar lymph node seen on the ?cardiac CT persists, measuring 2.5 x 1.7 cm on image 76/2 (1.9 x 1.3 ?cm on CT 12/16/2021 and newly enlarged from 09/06/2021). Additional ?small mediastinal lymph nodes, including a 1.3 cm AP window node on ?image 61/2 and  a subcarinal node measuring 1.3 cm on image 76/2 are ?stable from the 09/06/2021 examination. The thyroid gland, trachea ?and esophagus demonstrate no significant findings. ?  ?Lungs/Pleura: No pleural effusion or pneumoth

## 2022-03-07 NOTE — Progress Notes (Signed)
Surgical Instructions ? ? ? Your procedure is scheduled on Wednesday May 10th 2:15pm. ? Report to [facility] Main Entrance "A" at 2:15 P.M., then check in with the Admitting office. ? Call this number if you have problems the morning of surgery: ? [phone] ? ? If you have any questions prior to your surgery date call [phone]: Open Monday-Friday 8am-4pm ? ? ? Remember: ? Do not eat or drink after midnight the night before your surgery ? ?  ? Take these medicines the morning of surgery with A SIP OF WATER: ?atorvastatin (LIPITOR) 80 MG tablet ?diltiazem (TIAZAC) 240 MG 24 hr capsule ?esomeprazole (NEXIUM) 40 MG capsule ? ?IF NEEDED ?albuterol (VENTOLIN HFA) 108 (90 Base) MCG/ACT inhaler -please bring with you to the hosptial ?HYDROcodone-acetaminophen (NORCO) 10-325 MG tablet ?LORazepam (ATIVAN) 1 MG tablet ?umeclidinium-vilanterol (ANORO ELLIPTA) 62.5-25 MCG/INH AEPB -please bring with you to the hospital ? ?Follow your surgeon's instructions on when to stop Aspirin and Plavix.  If no instructions were given by your surgeon then you will need to call the office to get those instructions.   ? ? ?As of today, STOP taking any Aspirin (unless otherwise instructed by your surgeon) Voltaren, Aleve, Naproxen, Ibuprofen, Motrin, Advil, Goody's, BC's, all herbal medications, fish oil, and all vitamins. ? ?         ?Do not wear jewelry  ?Do not wear lotions, powders, colognes, or deodorant. ?Do not shave 48 hours prior to surgery.  Men may shave face and neck. ?Do not bring valuables to the hospital. ?Do not wear nail polish, gel polish, artificial nails, or any other type of covering on natural nails (fingers and toes) ?If you have artificial nails or gel coating that need to be removed by a nail salon, please have this removed prior to surgery. Artificial nails or gel coating may interfere with anesthesia's ability to adequately monitor your vital signs. ? ?[facility] is not responsible for any belongings or  valuables. .  ? ?Do NOT Smoke (Tobacco/Vaping)  24 hours prior to your procedure ? ?If you use a CPAP at night, you may bring your mask for your overnight stay. ?  ?Contacts, glasses, hearing aids, dentures or partials may not be worn into surgery, please bring cases for these belongings ?  ?For patients admitted to the hospital, discharge time will be determined by your treatment team. ?  ?Patients discharged the day of surgery will not be allowed to drive home, and someone needs to stay with them for 24 hours. ? ? ?SURGICAL WAITING ROOM VISITATION ?Patients having surgery or a procedure in a hospital may have two support people. ?Children under the age of 70 must have an adult with them who is not the patient. ?They may stay in the waiting area during the procedure and may switch out with other visitors. If the patient needs to stay at the hospital during part of their recovery, the visitor guidelines for inpatient rooms apply. ? ?Please refer to the [facility] website for the visitor guidelines for Inpatients (after your surgery is over and you are in a regular room).  ? ? ? ? ? ?Special instructions:   ? ?Oral Hygiene is also important to reduce your risk of infection.  Remember - BRUSH YOUR TEETH THE MORNING OF SURGERY WITH YOUR REGULAR TOOTHPASTE ? ? ?Francisco Austin- Preparing For Surgery ? ?Before surgery, you can play an important role. Because skin is not sterile, your skin needs to be as free of germs as possible.  You can reduce the number of germs on your skin by washing with CHG (chlorahexidine gluconate) Soap before surgery.  CHG is an antiseptic cleaner which kills germs and bonds with the skin to continue killing germs even after washing.   ? ? ?Please do not use if you have an allergy to CHG or antibacterial soaps. If your skin becomes reddened/irritated stop using the CHG.  ?Do not shave (including legs and underarms) for at least 48 hours prior to first CHG shower. It is OK to shave your  face. ? ?Please follow these instructions carefully. ?  ? ? Shower the NIGHT BEFORE SURGERY and the MORNING OF SURGERY with CHG Soap.  ? If you chose to wash your hair, wash your hair first as usual with your normal shampoo. After you shampoo, rinse your hair and body thoroughly to remove the shampoo.  Then [employer] and genitals (private parts) with your normal soap and rinse thoroughly to remove soap. ? ?After that Use CHG Soap as you would any other liquid soap. You can apply CHG directly to the skin and wash gently with a scrungie or a clean washcloth.  ? ?Apply the CHG Soap to your body ONLY FROM THE NECK DOWN.  Do not use on open wounds or open sores. Avoid contact with your eyes, ears, mouth and genitals (private parts). Wash Face and genitals (private parts)  with your normal soap.  ? ?Wash thoroughly, paying special attention to the area where your surgery will be performed. ? ?Thoroughly rinse your body with warm water from the neck down. ? ?DO NOT shower/wash with your normal soap after using and rinsing off the CHG Soap. ? ?Pat yourself dry with a CLEAN TOWEL. ? ?Wear CLEAN PAJAMAS to bed the night before surgery ? ?Place CLEAN SHEETS on your bed the night before your surgery ? ?DO NOT SLEEP WITH PETS. ? ? ?Day of Surgery: ? ?Take a shower with CHG soap. ?Wear Clean/Comfortable clothing the morning of surgery ?Do not apply any deodorants/lotions.   ?Remember to brush your teeth WITH YOUR REGULAR TOOTHPASTE. ? ? ? ?If you received a COVID test during your pre-op visit, it is requested that you wear a mask when out in public, stay away from anyone that may not be feeling well, and notify your surgeon if you develop symptoms. If you have been in contact with anyone that has tested positive in the last 10 days, please notify your surgeon. ? ?  ?Please read over the following fact sheets that you were given.  ? ?

## 2022-03-10 ENCOUNTER — Encounter (HOSPITAL_COMMUNITY): Payer: Self-pay

## 2022-03-10 ENCOUNTER — Ambulatory Visit (HOSPITAL_COMMUNITY)
Admission: RE | Admit: 2022-03-10 | Discharge: 2022-03-10 | Disposition: A | Discharge status: Home / Self Care | Payer: Medicare Other | Source: Ambulatory Visit | Attending: Thoracic Surgery (Cardiothoracic Vascular Surgery) | Admitting: Thoracic Surgery (Cardiothoracic Vascular Surgery)

## 2022-03-10 ENCOUNTER — Encounter (HOSPITAL_COMMUNITY)
Admission: RE | Admit: 2022-03-10 | Discharge: 2022-03-10 | Disposition: A | Discharge status: Home / Self Care | Payer: Medicare Other | Source: Ambulatory Visit | Attending: Thoracic Surgery (Cardiothoracic Vascular Surgery) | Admitting: Thoracic Surgery (Cardiothoracic Vascular Surgery)

## 2022-03-10 ENCOUNTER — Other Ambulatory Visit: Payer: Self-pay

## 2022-03-10 DIAGNOSIS — Z8616 Personal history of COVID-19: Secondary | ICD-10-CM | POA: Insufficient documentation

## 2022-03-10 DIAGNOSIS — I10 Essential (primary) hypertension: Secondary | ICD-10-CM | POA: Insufficient documentation

## 2022-03-10 DIAGNOSIS — Z7902 Long term (current) use of antithrombotics/antiplatelets: Secondary | ICD-10-CM | POA: Diagnosis not present

## 2022-03-10 DIAGNOSIS — Z955 Presence of coronary angioplasty implant and graft: Secondary | ICD-10-CM | POA: Insufficient documentation

## 2022-03-10 DIAGNOSIS — F17210 Nicotine dependence, cigarettes, uncomplicated: Secondary | ICD-10-CM | POA: Insufficient documentation

## 2022-03-10 DIAGNOSIS — E785 Hyperlipidemia, unspecified: Secondary | ICD-10-CM | POA: Insufficient documentation

## 2022-03-10 DIAGNOSIS — I2510 Atherosclerotic heart disease of native coronary artery without angina pectoris: Secondary | ICD-10-CM | POA: Insufficient documentation

## 2022-03-10 DIAGNOSIS — R7303 Prediabetes: Secondary | ICD-10-CM | POA: Insufficient documentation

## 2022-03-10 DIAGNOSIS — R590 Localized enlarged lymph nodes: Secondary | ICD-10-CM

## 2022-03-10 DIAGNOSIS — F419 Anxiety disorder, unspecified: Secondary | ICD-10-CM | POA: Insufficient documentation

## 2022-03-10 DIAGNOSIS — Z7982 Long term (current) use of aspirin: Secondary | ICD-10-CM | POA: Diagnosis not present

## 2022-03-10 DIAGNOSIS — Z01818 Encounter for other preprocedural examination: Secondary | ICD-10-CM | POA: Insufficient documentation

## 2022-03-10 DIAGNOSIS — J439 Emphysema, unspecified: Secondary | ICD-10-CM | POA: Insufficient documentation

## 2022-03-10 HISTORY — DX: Chronic obstructive pulmonary disease, unspecified: J44.9

## 2022-03-10 HISTORY — DX: Anxiety disorder, unspecified: F41.9

## 2022-03-10 HISTORY — DX: Prediabetes: R73.03

## 2022-03-10 HISTORY — DX: Personal history of urinary calculi: Z87.442

## 2022-03-10 LAB — CBC
HCT: 45.1 % (ref 39.0–52.0)
Hemoglobin: 14.8 g/dL (ref 13.0–17.0)
MCH: 31 pg (ref 26.0–34.0)
MCHC: 32.8 g/dL (ref 30.0–36.0)
MCV: 94.5 fL (ref 80.0–100.0)
Platelets: 308 10*3/uL (ref 150–400)
RBC: 4.77 MIL/uL (ref 4.22–5.81)
RDW: 14.4 % (ref 11.5–15.5)
WBC: 7.7 10*3/uL (ref 4.0–10.5)
nRBC: 0 % (ref 0.0–0.2)

## 2022-03-10 LAB — COMPREHENSIVE METABOLIC PANEL
ALT: 21 U/L (ref 0–44)
AST: 21 U/L (ref 15–41)
Albumin: 3.8 g/dL (ref 3.5–5.0)
Alkaline Phosphatase: 78 U/L (ref 38–126)
Anion gap: 8 (ref 5–15)
BUN: 8 mg/dL (ref 8–23)
CO2: 24 mmol/L (ref 22–32)
Calcium: 9.1 mg/dL (ref 8.9–10.3)
Chloride: 105 mmol/L (ref 98–111)
Creatinine, Ser: 0.69 mg/dL (ref 0.61–1.24)
GFR, Estimated: >60 mL/min (ref >60)
Glucose, Bld: 120 mg/dL — ABNORMAL HIGH (ref 70–99)
Potassium: 3.8 mmol/L (ref 3.5–5.1)
Sodium: 137 mmol/L (ref 135–145)
Total Bilirubin: 0.3 mg/dL (ref 0.3–1.2)
Total Protein: 7.2 g/dL (ref 6.5–8.1)

## 2022-03-10 LAB — PROTIME-INR
INR: 0.9 (ref 0.8–1.2)
Prothrombin Time: 12.2 seconds (ref 11.4–15.2)

## 2022-03-10 LAB — APTT: aPTT: 32 seconds (ref 24–36)

## 2022-03-10 NOTE — Progress Notes (Signed)
Surgical Instructions ? ? ? Your procedure is scheduled on Wednesday May 10th. ? Report to [facility] Main Entrance "A" at 2:15 P.M., then check in with the Admitting office. ? Call this number if you have problems the morning of surgery: ? [phone] ? ? If you have any questions prior to your surgery date call [phone]: Open Monday-Friday 8am-4pm ? ? ? Remember: ? Do not eat after midnight the night before your surgery ? ?You may drink clear liquids until 1:15 PM the afternoon of your surgery.   ?Clear liquids allowed are: Water, Non-Citrus Juices (without pulp), Carbonated Beverages, Clear Tea, Black Coffee ONLY (NO MILK, CREAM OR POWDERED CREAMER of any kind), and Gatorade ?  ? Take these medicines the morning of surgery with A SIP OF WATER:  ? atorvastatin (LIPITOR) 80 MG tablet ?diltiazem (TIAZAC) 240 MG 24 hr capsule ?esomeprazole (NEXIUM) 40 MG capsule ?  ?IF NEEDED ?albuterol inhaler -please bring with you on day of surgery ?HYDROcodone-acetaminophen (NORCO) 10-325 MG tablet ?LORazepam (ATIVAN) 1 MG tablet ?ANORO ELLIPTA inhaler -please bring with you on day of surgery ? ?Follow your surgeon's instructions on when to stop Aspirin & Plavix.  If no instructions were given by your surgeon then you will need to call the office to get those instructions.   ? ?As of today, STOP taking any Aspirin (unless otherwise instructed by your surgeon) Aleve, Naproxen, Ibuprofen, Motrin, Advil, Goody's, BC's, all herbal medications, fish oil, and all vitamins. ? ?         DAY OF SURGERY: ?Do not wear jewelry  ?Do not wear lotions, powders, colognes, or deodorant. ?Men may shave face and neck. ?Do not bring valuables to the hospital. ? ?[facility] is not responsible for any belongings or valuables. .  ? ?Do NOT Smoke (Tobacco/Vaping)  24 hours prior to your procedure ? ?If you use a CPAP at night, you may bring your mask for your overnight stay. ?  ?Contacts, glasses, hearing aids, dentures or partials may not be worn  into surgery, please bring cases for these belongings ?  ?For patients admitted to the hospital, discharge time will be determined by your treatment team. ?  ?Patients discharged the day of surgery will not be allowed to drive home, and someone needs to stay with them for 24 hours. ? ? ?SURGICAL WAITING ROOM VISITATION ?Patients having surgery or a procedure in a hospital may have two support people. ?Children under the age of 55 must have an adult with them who is not the patient. ?They may stay in the waiting area during the procedure and may switch out with other visitors. If the patient needs to stay at the hospital during part of their recovery, the visitor guidelines for inpatient rooms apply. ? ?Please refer to the [facility] website for the visitor guidelines for Inpatients (after your surgery is over and you are in a regular room).  ? ? ?Special instructions:   ? ?Oral Hygiene is also important to reduce your risk of infection.  Remember - BRUSH YOUR TEETH THE MORNING OF SURGERY WITH YOUR REGULAR TOOTHPASTE ? ? ?- Preparing For Surgery ? ?Before surgery, you can play an important role. Because skin is not sterile, your skin needs to be as free of germs as possible. You can reduce the number of germs on your skin by washing with CHG (chlorahexidine gluconate) Soap before surgery.  CHG is an antiseptic cleaner which kills germs and bonds with the skin to continue killing germs  even after washing.   ? ? ?Please do not use if you have an allergy to CHG or antibacterial soaps. If your skin becomes reddened/irritated stop using the CHG.  ?Do not shave (including legs and underarms) for at least 48 hours prior to first CHG shower. It is OK to shave your face. ? ?Please follow these instructions carefully. ?  ? ? Shower the NIGHT BEFORE SURGERY and the MORNING OF SURGERY with CHG Soap.  ? If you chose to wash your hair, wash your hair first as usual with your normal shampoo. After you shampoo, rinse your  hair and body thoroughly to remove the shampoo.  Then [employer] and genitals (private parts) with your normal soap and rinse thoroughly to remove soap. ? ?After that Use CHG Soap as you would any other liquid soap. You can apply CHG directly to the skin and wash gently with a scrungie or a clean washcloth.  ? ?Apply the CHG Soap to your body ONLY FROM THE NECK DOWN.  Do not use on open wounds or open sores. Avoid contact with your eyes, ears, mouth and genitals (private parts). Wash Face and genitals (private parts)  with your normal soap.  ? ?Wash thoroughly, paying special attention to the area where your surgery will be performed. ? ?Thoroughly rinse your body with warm water from the neck down. ? ?DO NOT shower/wash with your normal soap after using and rinsing off the CHG Soap. ? ?Pat yourself dry with a CLEAN TOWEL. ? ?Wear CLEAN PAJAMAS to bed the night before surgery ? ?Place CLEAN SHEETS on your bed the night before your surgery ? ?DO NOT SLEEP WITH PETS. ? ? ?Day of Surgery: ? ?Take a shower with CHG soap. ?Wear Clean/Comfortable clothing the morning of surgery ?Do not apply any deodorants/lotions.   ?Remember to brush your teeth WITH YOUR REGULAR TOOTHPASTE. ? ? ? ?If you received a COVID test during your pre-op visit, it is requested that you wear a mask when out in public, stay away from anyone that may not be feeling well, and notify your surgeon if you develop symptoms. If you have been in contact with anyone that has tested positive in the last 10 days, please notify your surgeon. ? ?  ?Please read over the following fact sheets that you were given.  ? ?

## 2022-03-10 NOTE — Progress Notes (Addendum)
PCP - [name] ?Cardiologist - Dr. Gwenlyn Found ? ?Chest x-ray - 03/10/22 ?EKG - 12/26/21 ?ECHO - 12/24/21 ?Cardiac Cath - 12/24/21 ? ? ?Blood Thinner Instructions: Do not stop Plavix, per Dr. Roxan Hockey ?Aspirin Instructions: Do not take ASA on day of surgery ? ? ?COVID TEST- patient stated he was positive in March (late March 2023). No need to retest. ? ? ?Anesthesia review: yes, heart history ?Patient states that he has random chest pain due to spasms of his arteries, and has had this for over 20 years. Cardiology aware. ? ?Patient denies shortness of breath, fever, cough and chest pain at PAT appointment ? ? ?All instructions explained to the patient, with a verbal understanding of the material. Patient agrees to go over the instructions while at home for a better understanding. Patient also instructed to self quarantine after being tested for COVID-19. The opportunity to ask questions was provided. ? ? ?

## 2022-03-11 DIAGNOSIS — E1169 Type 2 diabetes mellitus with other specified complication: Secondary | ICD-10-CM | POA: Diagnosis not present

## 2022-03-11 DIAGNOSIS — I10 Essential (primary) hypertension: Secondary | ICD-10-CM | POA: Diagnosis not present

## 2022-03-11 DIAGNOSIS — K219 Gastro-esophageal reflux disease without esophagitis: Secondary | ICD-10-CM | POA: Diagnosis not present

## 2022-03-11 DIAGNOSIS — M150 Primary generalized (osteo)arthritis: Secondary | ICD-10-CM | POA: Diagnosis not present

## 2022-03-11 DIAGNOSIS — E7800 Pure hypercholesterolemia, unspecified: Secondary | ICD-10-CM | POA: Diagnosis not present

## 2022-03-11 NOTE — Progress Notes (Signed)
Anesthesia Chart Review: ? Case: [id number] Date/Time: 03/12/22 1600  ? Procedure: VIDEO BRONCHOSCOPY WITH ENDOBRONCHIAL ULTRASOUND  ? Anesthesia type: General  ? Pre-op diagnosis: LEFT HILAR ADENOPATHY  ? Location: [facility] ROOM 10 / [facility]  ? Surgeons: Melrose Nakayama, MD  ? ?  ? ? ?DISCUSSION: Patient is a 71 year old male scheduled for the above procedure. ? ?History includes smoking, CAD (DES pLAD & atherectomy with overlapping DES RCA 12/24/21), HTN, HLD, COPD, prediabetes, anxiety. ? ?Last visit with cardiologist Dr. Gwenlyn Found on 01/15/22.  His chest pain had improved following February 2023 PCIs. He had undergone CCTA prior to LHC/PCI and was noted to have enlarged left hilar lymph node new from 09/06/21. Follow-up chest CT showed progression of enlargement and an indeterminate LUL lung nodule. 02/26/22 PET showed hypermetabolic left perihilar node suspicious for bronchogenic malignancy, and he was referred to CT surgery. The above procedure recommended. Given DES < 3 months ago, Plavix would need to be continued. Dr. Roxan Hockey wrote, "He understands increased risk of bleeding complications." Reportedly, he is also continuing ASA except not on the day of surgery.  ? ?He reported positive COVID-19 test in late March 2023, so he will not get a preoperative COVID-19 test.  ? ?Anesthesia team to evaluate on the day of surgery.  ? ?VS: BP 112/73   Pulse 82   Temp 36.5 ?C (Oral)   Resp 17   Ht 5\' 11"  (1.803 m)   Wt 91.6 kg   SpO2 95%   BMI 28.16 kg/m?  ? ? ?PROVIDERS: ?[name], MD is PCP  ?Quay Burow, MD is cardiologist ? ? ?LABS: Labs reviewed: Acceptable for surgery. ?(all labs ordered are listed, but only abnormal results are displayed) ? ?Labs Reviewed  ?COMPREHENSIVE METABOLIC PANEL - Abnormal; Notable for the following components:  ?    Result Value  ? Glucose, Bld 120 (*)   ? All other components within normal limits  ?CBC  ?PROTIME-INR  ?APTT  ? ? ? ?IMAGES: ?CXR 03/11/22: ?IMPRESSION: ?1.  Coronary artery calcification. Heart size is stable. No pulmonary ?venous congestion. ?2. Previously identified PET positive left perihilar lymph node best ?identified by prior PET-CT. Low lung volumes with mild bibasilar ?atelectasis and or scarring. ?  ?PET Scan 02/26/22: ?IMPRESSION: ?- Hypermetabolic left perihilar node, suspicious for bronchogenic ?malignancy. Associated small AP window node is indeterminate. ?- Otherwise, no findings suspicious for metastatic disease. ?  ?CT Chest 01/29/22: ?IMPRESSION: ?1. Progressive enlargement of left hilar lymph nodes seen on recent ?cardiac CT, new from 09/06/2021. Although not entirely specific, the ?progression over the last 6 weeks is worrisome for underlying ?malignancy. ?2. Indeterminate part solid and cystic left upper lobe nodule is ?unchanged from 09/06/2021, and does not demonstrate enlarging solid ?components. No other suspicious pulmonary nodules. ?3. Consultation with Pulmonology or Thoracic Surgery recommended. ?Management options include: (a) repeat chest CT in 3 months, (b) ?PET-CT to assess for metabolic activity in the hilar node and (c) ?tissue sampling. ?4. Coronary and aortic atherosclerosis (ICD10-I70.0). Emphysema ?(ICD10-J43.9). ? ? ?EKG: 12/25/21: ?Normal sinus rhythm ?T wave abnormality, consider anterior ischemia ?Abnormal ECG ?When compared with ECG of anterior T wvae abnormality is new ?Confirmed by Fransico Him [phone]) on 12/25/2021 12:21:27 PM ? ? ?CV: ? ?Limited Echo 12/24/21: ?Conclusion(s)/Recommendation(s): Limited periprocedural study shows no  ?evidence of pericardial effusion.  ? ? ?LHC 12/23/21 (Dr. Gwenlyn Found): ?  Prox LAD lesion is 90% stenosed. ?  Prox RCA to Mid RCA lesion is 80% stenosed. ?  The  left ventricular systolic function is normal. ?  LV end diastolic pressure is normal. ?  The left ventricular ejection fraction is 55-65% by visual estimate. ?IMPRESSION:  ?Mr. Omlor has two-vessel disease with a 90% fairly focal proximal LAD  stenosis at the takeoff of the first diagonal branch which I think can be fairly easily fixed with balloon and stenting.  He also has a more complex proximal to mid RCA which is highly calcified and probably should best be treated with orbital atherectomy followed by PCI drug-eluting stenting.  His circumflex has minimal disease.  His LV function is normal.  My TIG catheter dipped into his LV and most likely initiated a fast tachycardia which resolved when I withdrew the catheter.  The patient's baseline EKG changed from narrow complex to appears to be a bundle branch block.  He did develop chest pain with this and relative hypotension with systolic blood pressure in the 90s.  Based on this, I elected not to proceed with intervention but rather will watch him overnight and arrange for Dr. Irish Lack to revascularize tomorrow.  I did load him with 600 mg of Plavix and placed him on aspirin and clopidogrel in anticipation of his procedure.  ? ?PCI 12/24/21 (Dr. Irish Lack): ? Prox LAD lesion is 90% stenosed. ?  A drug-eluting stent was successfully placed using a STENT ONYX FRONTIER 3.5X12 and optimized with intravascular ultrasound. ?  Post intervention, there is a 0% residual stenosis. ?  Prox RCA to Mid RCA lesion is 80% stenosed. ?  Mid RCA lesion is 75% stenosed.  The RCA disease was treated with orbital atherectomy in the proximal to mid vessel ?  A drug-eluting stent was successfully placed using a SYNERGY XD 4.0X38 distally. ?  A drug-eluting stent was successfully placed using a SYNERGY XD 3.50X24 overlapping proximally.  There appeared to be some dye behind the stent.  This was confirmed by intravascular ultrasound.  Echocardiogram did not show any perforation. ?  Post intervention, there is a 0% residual stenosis. ?  Post intervention, there is a 0% residual stenosis. ?  ?Complex intervention of the RCA. Successful PCI of the LAD.  He will need dual antiplatelet therapy for at least 6 months.  I would strongly  consider lifelong clopidogrel monotherapy given the calcific disease he has in his right coronary artery. ? ?Continue aggressive secondary prevention. ? ? ?Echo 04/06/17: ?Conclusion: ?1.  Mild concentric LVH with normal global wall motion.  Estimated EF 60%. ?2.  Mild (grade 1) mitral regurgitation.  Mild calcification of the mitral valve annulus. ?3.  Mild tricuspid regurgitation. ?4.  IVC is dilated with respiratory variation. ? ? ?Past Medical History:  ?Diagnosis Date  ? Anxiety   ? Back pain   ? CAD (coronary artery disease)   ? COPD (chronic obstructive pulmonary disease) ([place])   ? History of kidney stones   ? HLD (hyperlipidemia)   ? HTN (hypertension)   ? Pre-diabetes   ? ? ?Past Surgical History:  ?Procedure Laterality Date  ? APPENDECTOMY    ? CORONARY STENT INTERVENTION N/A 12/24/2021  ? Procedure: CORONARY STENT INTERVENTION;  Surgeon: Jettie Booze, MD;  Location: [facility];  Service: Cardiovascular;  Laterality: N/A;  ? HEMORROIDECTOMY    ? INTRAVASCULAR ULTRASOUND/IVUS N/A 12/24/2021  ? Procedure: Intravascular Ultrasound/IVUS;  Surgeon: Jettie Booze, MD;  Location: [facility];  Service: Cardiovascular;  Laterality: N/A;  ? LEFT HEART CATH AND CORONARY ANGIOGRAPHY N/A 12/23/2021  ? Procedure: LEFT  HEART CATH AND CORONARY ANGIOGRAPHY;  Surgeon: Lorretta Harp, MD;  Location: [facility];  Service: Cardiovascular;  Laterality: N/A;  ? TONSILLECTOMY    ? ? ?MEDICATIONS: ? albuterol (VENTOLIN HFA) 108 (90 Base) MCG/ACT inhaler  ? aspirin EC 81 MG tablet  ? atorvastatin (LIPITOR) 80 MG tablet  ? Cholecalciferol 50 MCG (2000 UT) TABS  ? clopidogrel (PLAVIX) 75 MG tablet  ? clopidogrel (PLAVIX) 75 MG tablet  ? diclofenac sodium (VOLTAREN) 1 % GEL  ? diltiazem (TIAZAC) 240 MG 24 hr capsule  ? esomeprazole (NEXIUM) 40 MG capsule  ? HYDROcodone-acetaminophen (NORCO) 10-325 MG tablet  ? LORazepam (ATIVAN) 1 MG tablet  ? nitroGLYCERIN (NITROSTAT) 0.4 MG SL tablet  ?  telmisartan (MICARDIS) 80 MG tablet  ? umeclidinium-vilanterol (ANORO ELLIPTA) 62.5-25 MCG/INH AEPB  ? vitamin B-12 (CYANOCOBALAMIN) 500 MCG tablet  ? ?No current facility-administered medications for this encounter.  ? ? ? ?A

## 2022-03-11 NOTE — Anesthesia Preprocedure Evaluation (Addendum)
Anesthesia Evaluation  ?Patient identified by MRN, date of birth, ID band ?Patient awake ? ? ? ?Reviewed: ?Allergy & Precautions, NPO status , Patient's Chart, lab work & pertinent test results ? ?History of Anesthesia Complications ?Negative for: history of anesthetic complications ? ?Airway ?Mallampati: IV ? ?TM Distance: <3 FB ? ? ? ? Dental ? ?(+) Edentulous Upper, Missing,  ?  ?Pulmonary ?COPD, Current Smoker and Patient abstained from smoking.,  ?  ?breath sounds clear to auscultation ? ? ? ? ? ? Cardiovascular ?hypertension, Pt. on medications ?(-) angina+ CAD  ?(-) CHF  ?Rhythm:Regular  ? ?  ?Neuro/Psych ? Neuromuscular disease negative psych ROS  ? GI/Hepatic ?Neg liver ROS, GERD  Medicated,  ?Endo/Other  ?negative endocrine ROS ? Renal/GU ?negative Renal ROSLab Results ?     Component                Value               Date                 ?     CREATININE               0.69                03/10/2022           ?  ? ?  ?Musculoskeletal ? ?(+) Arthritis ,  ? Abdominal ?  ?Peds ? Hematology ? ?(+) Blood dyscrasia, , plavix ? ?Lab Results ?     Component                Value               Date                 ?     WBC                      7.7                 03/10/2022           ?     HGB                      14.8                03/10/2022           ?     HCT                      45.1                03/10/2022           ?     MCV                      94.5                03/10/2022           ?     PLT                      308                 03/10/2022           ?   ?Anesthesia Other Findings ??  Prox LAD lesion is 90% stenosed. ??  A drug-eluting stent was successfully placed using a STENT ONYX FRONTIER 3.5X12 and optimized with intravascular ultrasound. ??  Post intervention, there is a 0% residual stenosis. ??  Prox RCA to Mid RCA lesion is 80% stenosed. ??  Mid RCA lesion is 75% stenosed.  The RCA disease was treated with orbital atherectomy in the proximal to mid  vessel ??  A drug-eluting stent was successfully placed using a SYNERGY XD 4.0X38 distally. ??  A drug-eluting stent was successfully placed using a SYNERGY XD 3.50X24 overlapping proximally.  There appeared to be some dye behind the stent.  This was confirmed by intravascular ultrasound.  Echocardiogram did not show any perforation. ??  Post intervention, there is a 0% residual stenosis. ??  Post intervention, there is a 0% residual stenosis. ?? ?Complex intervention of the RCA.  Successful PCI of the LAD.  He will need dual antiplatelet therapy for at least 6 months.  I would strongly consider lifelong clopidogrel monotherapy given the calcific disease he has in his right coronary artery. ? ?Continue aggressive secondary prevention. ? ? Reproductive/Obstetrics ? ?  ? ? ? ? ? ? ? ? ? ? ? ? ? ?  ?  ? ? ? ? ? ? ? ?Anesthesia Physical ?Anesthesia Plan ? ?ASA: 4 ? ?Anesthesia Plan: General  ? ?Post-op Pain Management: Minimal or no pain anticipated  ? ?Induction: Intravenous ? ?PONV Risk Score and Plan: 1 and Ondansetron ? ?Airway Management Planned: Oral ETT ? ?Additional Equipment:  ? ?Intra-op Plan:  ? ?Post-operative Plan: Extubation in OR ? ?Informed Consent: I have reviewed the patients History and Physical, chart, labs and discussed the procedure including the risks, benefits and alternatives for the proposed anesthesia with the patient or authorized representative who has indicated his/her understanding and acceptance.  ? ? ? ?Dental advisory given ? ?Plan Discussed with: CRNA ? ?Anesthesia Plan Comments: (PAT note written 03/11/2022 by Myra Gianotti, PA-C. DES LADL & RCA 12/24/21. Continuing Plavix.  ?)  ? ? ? ?Anesthesia Quick Evaluation ? ?

## 2022-03-12 ENCOUNTER — Encounter (HOSPITAL_COMMUNITY)
Admission: RE | Disposition: A | Discharge status: Home / Self Care | Payer: Self-pay | Source: Home / Self Care | Attending: Thoracic Surgery (Cardiothoracic Vascular Surgery)

## 2022-03-12 ENCOUNTER — Encounter (HOSPITAL_COMMUNITY): Payer: Self-pay | Admitting: Thoracic Surgery (Cardiothoracic Vascular Surgery)

## 2022-03-12 ENCOUNTER — Ambulatory Visit (HOSPITAL_COMMUNITY): Payer: Medicare Other | Admitting: Vascular Surgery

## 2022-03-12 ENCOUNTER — Ambulatory Visit (HOSPITAL_COMMUNITY)
Admission: RE | Admit: 2022-03-12 | Discharge: 2022-03-12 | Disposition: A | Discharge status: Home / Self Care | Payer: Medicare Other | Attending: Thoracic Surgery (Cardiothoracic Vascular Surgery) | Admitting: Thoracic Surgery (Cardiothoracic Vascular Surgery)

## 2022-03-12 ENCOUNTER — Other Ambulatory Visit: Payer: Self-pay

## 2022-03-12 ENCOUNTER — Ambulatory Visit (HOSPITAL_BASED_OUTPATIENT_CLINIC_OR_DEPARTMENT_OTHER): Payer: Medicare Other | Admitting: Certified Registered"

## 2022-03-12 DIAGNOSIS — I700 Atherosclerosis of aorta: Secondary | ICD-10-CM | POA: Insufficient documentation

## 2022-03-12 DIAGNOSIS — C771 Secondary and unspecified malignant neoplasm of intrathoracic lymph nodes: Secondary | ICD-10-CM | POA: Diagnosis not present

## 2022-03-12 DIAGNOSIS — Z955 Presence of coronary angioplasty implant and graft: Secondary | ICD-10-CM | POA: Diagnosis not present

## 2022-03-12 DIAGNOSIS — Z7902 Long term (current) use of antithrombotics/antiplatelets: Secondary | ICD-10-CM | POA: Insufficient documentation

## 2022-03-12 DIAGNOSIS — I10 Essential (primary) hypertension: Secondary | ICD-10-CM | POA: Diagnosis not present

## 2022-03-12 DIAGNOSIS — M1990 Unspecified osteoarthritis, unspecified site: Secondary | ICD-10-CM | POA: Diagnosis not present

## 2022-03-12 DIAGNOSIS — D649 Anemia, unspecified: Secondary | ICD-10-CM | POA: Insufficient documentation

## 2022-03-12 DIAGNOSIS — J449 Chronic obstructive pulmonary disease, unspecified: Secondary | ICD-10-CM | POA: Diagnosis not present

## 2022-03-12 DIAGNOSIS — R590 Localized enlarged lymph nodes: Secondary | ICD-10-CM

## 2022-03-12 DIAGNOSIS — R846 Abnormal cytological findings in specimens from respiratory organs and thorax: Secondary | ICD-10-CM | POA: Diagnosis not present

## 2022-03-12 DIAGNOSIS — J439 Emphysema, unspecified: Secondary | ICD-10-CM | POA: Diagnosis not present

## 2022-03-12 DIAGNOSIS — C3432 Malignant neoplasm of lower lobe, left bronchus or lung: Secondary | ICD-10-CM | POA: Diagnosis not present

## 2022-03-12 DIAGNOSIS — G709 Myoneural disorder, unspecified: Secondary | ICD-10-CM | POA: Diagnosis not present

## 2022-03-12 DIAGNOSIS — I2510 Atherosclerotic heart disease of native coronary artery without angina pectoris: Secondary | ICD-10-CM | POA: Diagnosis not present

## 2022-03-12 DIAGNOSIS — F17290 Nicotine dependence, other tobacco product, uncomplicated: Secondary | ICD-10-CM | POA: Insufficient documentation

## 2022-03-12 DIAGNOSIS — K219 Gastro-esophageal reflux disease without esophagitis: Secondary | ICD-10-CM | POA: Insufficient documentation

## 2022-03-12 HISTORY — PX: VIDEO BRONCHOSCOPY WITH ENDOBRONCHIAL ULTRASOUND: SHX6177

## 2022-03-12 SURGERY — BRONCHOSCOPY, WITH EBUS
Anesthesia: General

## 2022-03-12 MED ORDER — LIDOCAINE 2% (20 MG/ML) 5 ML SYRINGE
INTRAMUSCULAR | Status: AC
  Filled 2022-03-12: qty 5

## 2022-03-12 MED ORDER — FENTANYL CITRATE (PF) 250 MCG/5ML IJ SOLN
INTRAMUSCULAR | Status: AC
  Filled 2022-03-12: qty 5

## 2022-03-12 MED ORDER — MIDAZOLAM HCL 2 MG/2ML IJ SOLN
INTRAMUSCULAR | Status: AC
  Filled 2022-03-12: qty 2

## 2022-03-12 MED ORDER — SUGAMMADEX SODIUM 200 MG/2ML IV SOLN
INTRAVENOUS | Status: DC | PRN
  Administered 2022-03-12: 200 mg via INTRAVENOUS

## 2022-03-12 MED ORDER — ORAL CARE MOUTH RINSE: 15.0000 mL | Freq: Once | OROMUCOSAL | Status: AC

## 2022-03-12 MED ORDER — EPINEPHRINE PF 1 MG/ML IJ SOLN
INTRAMUSCULAR | Status: AC
  Filled 2022-03-12: qty 1

## 2022-03-12 MED ORDER — ACETAMINOPHEN 10 MG/ML IV SOLN: 1000.0000 mg | Freq: Once | INTRAVENOUS | Status: DC | PRN

## 2022-03-12 MED ORDER — LIDOCAINE 2% (20 MG/ML) 5 ML SYRINGE
INTRAMUSCULAR | Status: DC | PRN
  Administered 2022-03-12: 80 mg via INTRAVENOUS
  Administered 2022-03-12: 60 mg via INTRAVENOUS

## 2022-03-12 MED ORDER — 0.9 % SODIUM CHLORIDE (POUR BTL) OPTIME
TOPICAL | Status: DC | PRN
  Administered 2022-03-12: 1000 mL

## 2022-03-12 MED ORDER — ROCURONIUM BROMIDE 10 MG/ML (PF) SYRINGE
PREFILLED_SYRINGE | INTRAVENOUS | Status: DC | PRN
  Administered 2022-03-12: 10 mg via INTRAVENOUS
  Administered 2022-03-12: 70 mg via INTRAVENOUS

## 2022-03-12 MED ORDER — ROCURONIUM BROMIDE 10 MG/ML (PF) SYRINGE
PREFILLED_SYRINGE | INTRAVENOUS | Status: AC
  Filled 2022-03-12: qty 10

## 2022-03-12 MED ORDER — PROPOFOL 10 MG/ML IV BOLUS
INTRAVENOUS | Status: AC
  Filled 2022-03-12: qty 20

## 2022-03-12 MED ORDER — MIDAZOLAM HCL 2 MG/2ML IJ SOLN
INTRAMUSCULAR | Status: DC | PRN
  Administered 2022-03-12: 1 mg via INTRAVENOUS

## 2022-03-12 MED ORDER — EPINEPHRINE PF 1 MG/ML IJ SOLN
INTRAMUSCULAR | Status: DC | PRN
  Administered 2022-03-12 (×2): 1 mg via ENDOTRACHEOPULMONARY

## 2022-03-12 MED ORDER — LACTATED RINGERS IV SOLN: INTRAVENOUS | Status: DC

## 2022-03-12 MED ORDER — ACETAMINOPHEN 160 MG/5ML PO SOLN: 1000.0000 mg | Freq: Once | ORAL | Status: DC | PRN

## 2022-03-12 MED ORDER — CEFAZOLIN SODIUM-DEXTROSE 2-3 GM-%(50ML) IV SOLR
INTRAVENOUS | Status: DC | PRN
  Administered 2022-03-12: 2 g via INTRAVENOUS

## 2022-03-12 MED ORDER — ONDANSETRON HCL 4 MG/2ML IJ SOLN
INTRAMUSCULAR | Status: DC | PRN
  Administered 2022-03-12: 4 mg via INTRAVENOUS

## 2022-03-12 MED ORDER — PROPOFOL 10 MG/ML IV BOLUS
INTRAVENOUS | Status: DC | PRN
  Administered 2022-03-12: 100 mg via INTRAVENOUS

## 2022-03-12 MED ORDER — CHLORHEXIDINE GLUCONATE 0.12 % MT SOLN: 15.0000 mL | Freq: Once | OROMUCOSAL | Status: AC

## 2022-03-12 MED ORDER — FENTANYL CITRATE (PF) 250 MCG/5ML IJ SOLN
INTRAMUSCULAR | Status: DC | PRN
  Administered 2022-03-12: 100 ug via INTRAVENOUS
  Administered 2022-03-12: 25 ug via INTRAVENOUS

## 2022-03-12 MED ORDER — PHENYLEPHRINE 80 MCG/ML (10ML) SYRINGE FOR IV PUSH (FOR BLOOD PRESSURE SUPPORT)
PREFILLED_SYRINGE | INTRAVENOUS | Status: DC | PRN
Start: 2022-03-12 — End: 2022-03-12
  Administered 2022-03-12 (×2): 80 ug via INTRAVENOUS

## 2022-03-12 MED ORDER — ACETAMINOPHEN 500 MG PO TABS: 1000.0000 mg | ORAL_TABLET | Freq: Once | ORAL | Status: DC | PRN

## 2022-03-12 MED ORDER — CHLORHEXIDINE GLUCONATE 0.12 % MT SOLN
OROMUCOSAL | Status: AC
  Administered 2022-03-12: 15 mL via OROMUCOSAL
  Filled 2022-03-12: qty 15

## 2022-03-12 SURGICAL SUPPLY — 45 items
ADAPTER VALVE BIOPSY EBUS (MISCELLANEOUS) IMPLANT
ADPTR VALVE BIOPSY EBUS (MISCELLANEOUS)
BLADE CLIPPER SURG (BLADE) ×2 IMPLANT
BRUSH CYTOL CELLEBRITY 1.5X140 (MISCELLANEOUS) ×1 IMPLANT
CANISTER SUCT 3000ML PPV (MISCELLANEOUS) ×2 IMPLANT
CNTNR URN SCR LID CUP LEK RST (MISCELLANEOUS) ×1 IMPLANT
CONT SPEC 4OZ STRL OR WHT (MISCELLANEOUS) ×4
COVER BACK TABLE 60X90IN (DRAPES) ×2 IMPLANT
FILTER STRAW FLUID ASPIR (MISCELLANEOUS) ×1 IMPLANT
FORCEPS BIOP RJ4 1.8 (CUTTING FORCEPS) IMPLANT
FORCEPS RADIAL JAW LRG 4 PULM (INSTRUMENTS) IMPLANT
GAUZE 4X4 16PLY ~~LOC~~+RFID DBL (SPONGE) ×2 IMPLANT
GAUZE SPONGE 4X4 12PLY STRL (GAUZE/BANDAGES/DRESSINGS) IMPLANT
GLOVE SURG SIGNA 7.5 PF LTX (GLOVE) ×2 IMPLANT
GOWN STRL REUS W/ TWL XL LVL3 (GOWN DISPOSABLE) ×1 IMPLANT
GOWN STRL REUS W/TWL XL LVL3 (GOWN DISPOSABLE) ×2
KIT CLEAN ENDO COMPLIANCE (KITS) ×4 IMPLANT
KIT TURNOVER KIT B (KITS) ×2 IMPLANT
MARKER SKIN DUAL TIP RULER LAB (MISCELLANEOUS) ×2 IMPLANT
NDL ASPIRATION VIZISHOT 19G (NEEDLE) IMPLANT
NDL ASPIRATION VIZISHOT 21G (NEEDLE) ×1 IMPLANT
NDL BLUNT 18X1 FOR OR ONLY (NEEDLE) IMPLANT
NEEDLE ASPIRATION VIZISHOT 19G (NEEDLE) IMPLANT
NEEDLE ASPIRATION VIZISHOT 21G (NEEDLE) ×2 IMPLANT
NEEDLE BLUNT 18X1 FOR OR ONLY (NEEDLE) IMPLANT
NS IRRIG 1000ML POUR BTL (IV SOLUTION) ×2 IMPLANT
OIL SILICONE PENTAX (PARTS (SERVICE/REPAIRS)) ×2 IMPLANT
PAD ARMBOARD 7.5X6 YLW CONV (MISCELLANEOUS) ×4 IMPLANT
RADIAL JAW LRG 4 PULMONARY (INSTRUMENTS) ×1
SPONGE T-LAP 18X18 ~~LOC~~+RFID (SPONGE) ×8 IMPLANT
SPONGE T-LAP 4X18 ~~LOC~~+RFID (SPONGE) ×2 IMPLANT
SYR 20ML ECCENTRIC (SYRINGE) ×2 IMPLANT
SYR 20ML LL LF (SYRINGE) ×2 IMPLANT
SYR 30ML SLIP (SYRINGE) ×1 IMPLANT
SYR 3ML LL SCALE MARK (SYRINGE) IMPLANT
SYR 5ML LL (SYRINGE) ×2 IMPLANT
SYR 5ML LUER SLIP (SYRINGE) ×2 IMPLANT
TOWEL GREEN STERILE (TOWEL DISPOSABLE) ×2 IMPLANT
TOWEL GREEN STERILE FF (TOWEL DISPOSABLE) ×2 IMPLANT
TRAP SPECIMEN MUCUS 40CC (MISCELLANEOUS) ×2 IMPLANT
TUBE CONNECTING 20X1/4 (TUBING) ×2 IMPLANT
VALVE BIOPSY  SINGLE USE (MISCELLANEOUS) ×4
VALVE BIOPSY SINGLE USE (MISCELLANEOUS) ×1 IMPLANT
VALVE SUCTION BRONCHIO DISP (MISCELLANEOUS) ×3 IMPLANT
WATER STERILE IRR 1000ML POUR (IV SOLUTION) ×2 IMPLANT

## 2022-03-12 NOTE — Brief Op Note (Signed)
03/12/2022 ? ?4:07 PM ? ?PATIENT:  Francisco Austin  71 y.o. male ? ?PRE-OPERATIVE DIAGNOSIS:  LEFT HILAR ADENOPATHY ? ?POST-OPERATIVE DIAGNOSIS:  LEFT HILAR ADENOPATHY ? ?PROCEDURE:  Procedure(s): ?VIDEO BRONCHOSCOPY WITH ENDOBRONCHIAL ULTRASOUND (N/A) ?Needle aspirations, brushings and endobronchial biopsies ? ?SURGEON:  Surgeon(s) and Role: ?   * Melrose Nakayama, MD - Primary ? ?PHYSICIAN ASSISTANT:  ? ?ASSISTANTS: none  ? ?ANESTHESIA:   general ? ?EBL:  5 mL  ? ?BLOOD ADMINISTERED:none ? ?DRAINS: none  ? ?LOCAL MEDICATIONS USED:  NONE ? ?SPECIMEN:  Source of Specimen:  Lymph node aspirations, brushings, TBB from LLL bronchus ? ?DISPOSITION OF SPECIMEN:  PATHOLOGY ? ?COUNTS:  NO endo ? ?TOURNIQUET:  * No tourniquets in log * ? ?DICTATION: .Other Dictation: Dictation Number - ? ?PLAN OF CARE: Discharge to home after PACU ? ?PATIENT DISPOSITION:  PACU - hemodynamically stable. ?  ?Delay start of Pharmacological VTE agent (>24hrs) due to surgical blood loss or risk of bleeding: not applicable ? ?

## 2022-03-12 NOTE — Anesthesia Postprocedure Evaluation (Signed)
Anesthesia Post Note ? ?Patient: [facility] ? ?Procedure(s) Performed: VIDEO BRONCHOSCOPY WITH ENDOBRONCHIAL ULTRASOUND ? ?  ? ?Patient location during evaluation: PACU ?Anesthesia Type: General ?Level of consciousness: awake and alert ?Pain management: pain level controlled ?Vital Signs Assessment: post-procedure vital signs reviewed and stable ?Respiratory status: spontaneous breathing, nonlabored ventilation and respiratory function stable ?Cardiovascular status: blood pressure returned to baseline and stable ?Postop Assessment: no apparent nausea or vomiting ?Anesthetic complications: no ? ? ?No notable events documented. ? ?Last Vitals:  ?Vitals:  ? 03/12/22 1609 03/12/22 1624  ?BP: (!) 104/59 (!) 108/54  ?Pulse: 81 77  ?Resp: 16 12  ?Temp:  36.8 ?C  ?SpO2: 92% 90%  ?  ?Last Pain:  ?Vitals:  ? 03/12/22 1624  ?TempSrc:   ?PainSc: 0-No pain  ? ? ?  ?  ?  ?  ?  ?  ? ?Lidia Collum ? ? ? ? ?

## 2022-03-12 NOTE — Transfer of Care (Signed)
Immediate Anesthesia Transfer of Care Note ? ?Patient: Francisco Austin ? ?Procedure(s) Performed: VIDEO BRONCHOSCOPY WITH ENDOBRONCHIAL ULTRASOUND ? ?Patient Location: PACU ? ?Anesthesia Type:General ? ?Level of Consciousness: awake, alert  and oriented ? ?Airway & Oxygen Therapy: Patient Spontanous Breathing ? ?Post-op Assessment: Report given to RN, Post -op Vital signs reviewed and stable and Patient moving all extremities X 4 ? ?Post vital signs: Reviewed and stable ? ?Last Vitals:  ?Vitals Value Taken Time  ?BP 115/57 03/12/22 1554  ?Temp 36.8 ?C 03/12/22 1554  ?Pulse 86 03/12/22 1556  ?Resp 21 03/12/22 1556  ?SpO2 91 % 03/12/22 1556  ?Vitals shown include unvalidated device data. ? ?Last Pain:  ?Vitals:  ? 03/12/22 1554  ?TempSrc:   ?PainSc: 5   ?   ? ?Patients Stated Pain Goal: 3 (03/12/22 1554) ? ?Complications: No notable events documented. ?

## 2022-03-12 NOTE — Anesthesia Procedure Notes (Signed)
Procedure Name: Intubation ?Date/Time: 03/12/2022 2:32 PM ?Performed by: Justin Mend, RN ?Pre-anesthesia Checklist: Patient identified, Emergency Drugs available, Suction available and Patient being monitored ?Patient Re-evaluated:Patient Re-evaluated prior to induction ?Oxygen Delivery Method: Circle System Utilized ?Preoxygenation: Pre-oxygenation with 100% oxygen ?Induction Type: IV induction ?Ventilation: Mask ventilation without difficulty ?Laryngoscope Size: Sabra Heck and 3 ?Grade View: Grade I ?Tube type: Oral ?Number of attempts: 1 ?Airway Equipment and Method: Stylet and Oral airway ?Placement Confirmation: ETT inserted through vocal cords under direct vision, positive ETCO2 and breath sounds checked- equal and bilateral ?Secured at: 23 cm ?Tube secured with: Tape ?Dental Injury: Teeth and Oropharynx as per pre-operative assessment  ?Comments: Performed by Justin Mend, SRNA under direct supervision of MDA and CRNA ? ? ? ? ?

## 2022-03-12 NOTE — Interval H&P Note (Signed)
History and Physical Interval Note: ? ?03/12/2022 ?1:44 PM ? ?Francisco Austin  has presented today for surgery, with the diagnosis of LEFT HILAR ADENOPATHY.  The various methods of treatment have been discussed with the patient and family. After consideration of risks, benefits and other options for treatment, the patient has consented to  Procedure(s): ?VIDEO BRONCHOSCOPY WITH ENDOBRONCHIAL ULTRASOUND (N/A) as a surgical intervention.  The patient's history has been reviewed, patient examined, no change in status, stable for surgery.  I have reviewed the patient's chart and labs.  Questions were answered to the patient's satisfaction.   ? ? ?Melrose Nakayama ? ? ?

## 2022-03-12 NOTE — Discharge Instructions (Signed)
Do not drive or engage in heavy physical activity for 24 hours.  You may resume normal activities tomorrow. ? ?You may cough up small amounts of blood over the next few days. ? ?You may use acetaminophen (Tylenol) if needed for discomfort. You may use an over the counter cough medication or throat lozenges as needed. ? ?Call [phone] if you develop chest pain, shortness of breath, fever > 101 F or cough up more than a tablespoon of blood. ? ?My office will contact you with a follow up appointment for next week. ?

## 2022-03-13 ENCOUNTER — Encounter (HOSPITAL_COMMUNITY): Payer: Self-pay | Admitting: Thoracic Surgery (Cardiothoracic Vascular Surgery)

## 2022-03-13 NOTE — Op Note (Signed)
NAME: Francisco Austin, RHETT MEDICAL RECORD NO: [id number] ACCOUNT NO: [id number] DATE OF BIRTH: June 30, 1951 FACILITY: [facility] LOCATION: [facility] PHYSICIAN: Revonda Standard. Roxan Hockey, MD  Operative Report   DATE OF PROCEDURE: 03/12/2022  PREOPERATIVE DIAGNOSIS:  Left hilar adenopathy.  POSTOPERATIVE DIAGNOSIS:  Left hilar adenopathy.  PROCEDURE: 1.  Bronchoscopy with brushings and endobronchial biopsies. 2.  Endobronchial ultrasound with lymph node needle aspirations.  SURGEON:  Revonda Standard. Roxan Hockey, MD  ASSISTANT:  None.  ANESTHESIA:  General.  FINDINGS:  Some atypical cells on quick prep of 11L lymph nodes.  CLINICAL NOTE:  Francisco Austin is a 71 year old man with a history of smoking and coronary artery disease.  He recently had placement of 3 stents and is on Plavix for that.  During his evaluation for that he had a coronary CT, which showed fullness in the  left hilum.  CT of the chest confirmed a left hilar and paratracheal adenopathy.  The paratracheal adenopathy was stable, but the hilar adenopathy was new.  On PET/CT, the nodes were markedly hypermetabolic.  He was advised to undergo bronchoscopy and  endobronchial ultrasound for diagnosis and staging purposes.  The indications, risks, benefits, and alternatives were discussed in detail with the patient.  He understood and accepted the risks and agreed to proceed.  DESCRIPTION OF PROCEDURE:  Francisco Austin was brought to the operating room on 03/12/2022.  He had induction of general anesthesia and was intubated.  Sequential compression devices were placed on the calves for DVT prophylaxis.  Ancef was given intravenously  for antibiotic prophylaxis.  A Bair Hugger was placed for active warming.  A timeout was performed.  Flexible fiberoptic bronchoscopy was performed via the endotracheal tube.  It revealed normal endobronchial anatomy. Near the takeoff of the left lower lobe bronchus just distal to the origin of the bronchus there was some   mucosal abnormality, but no definite tumor.  The endobronchial ultrasound probe was advanced.  There was a moderately enlarged left paratracheal node, which correlated with the mildly hypermetabolic node on PET, needle aspirations were performed from this node.  All lymph node aspirations were done  with realtime ultrasound visualization.  There was good positioning of the needle within the node. Aspirations were performed both with and without suction applied and 15 passes of the needle were done with each aspiration. Part of each specimen was  placed on slides for immediate examination and the remainder was placed into cytologic preparation fluid for cell block.  The scope then was advanced down the left mainstem bronchus and there were 2 nodes seen in the 11L region, which were of concern, one was more definitively 11L and the other was more posteriorly on the airway.  Both of these nodes were enlarged.   Aspirations were obtained from each of the nodes.  Multiple aspirations were done again alternating between having suction applied and doing without suction.  No other enlarged lymph nodes were noted and the endobronchial ultrasound scope was removed.  The bronchoscope was reinserted.  It was advanced to the left lower lobe bronchus and again just past the origin just prior to the bifurcation of the superior segment and the basilar segments, there was a mucosal abnormality.  This was a difficult  area to access due to angulation with the scope, but brushings were obtained from the area and then multiple biopsies were taken.  There was some bleeding with the biopsies and dilute epinephrine solution was applied topically.  There was good hemostasis  at the end of  the procedure, biopsies of the 11L node showed atypical cells, no tumor was seen on the brushings from the left lower lobe.  Final inspection was made for hemostasis.  The scope was removed.  The patient was extubated in the operating room   and taken to the postanesthetic care unit in good condition.   PUS D: 03/13/2022 12:01:44 pm T: 03/13/2022 3:30:00 pm  JOB: [id number]/ [id number]

## 2022-03-14 ENCOUNTER — Encounter: Payer: Self-pay | Admitting: Thoracic Surgery (Cardiothoracic Vascular Surgery)

## 2022-03-14 LAB — CYTOLOGY - NON PAP

## 2022-03-14 LAB — SURGICAL PATHOLOGY

## 2022-03-17 ENCOUNTER — Telehealth: Payer: Self-pay | Admitting: Cardiovascular Disease

## 2022-03-17 DIAGNOSIS — E782 Mixed hyperlipidemia: Secondary | ICD-10-CM

## 2022-03-17 MED ORDER — ATORVASTATIN CALCIUM 80 MG PO TABS: 80.0000 mg | ORAL_TABLET | Freq: Every day | ORAL | 1 refills | Status: DC

## 2022-03-17 NOTE — Telephone Encounter (Signed)
?*  STAT* If patient is at the pharmacy, call can be transferred to refill team. ? ? ?1. Which medications need to be refilled? (please list name of each medication and dose if known) atorvastatin (LIPITOR) 80 MG tablet ? ?2. Which pharmacy/location (including street and city if local pharmacy) is medication to be sent to? [facility] - [place], [place] - [place] [address] ? ?3. Do they need a 30 day or 90 day supply? 90 day supply  ?

## 2022-03-17 NOTE — Telephone Encounter (Signed)
Atorvastatin (Lipitor) sent to [facility]. ?

## 2022-03-18 LAB — CYTOLOGY - NON PAP

## 2022-03-20 ENCOUNTER — Encounter: Payer: Self-pay | Admitting: Thoracic Surgery (Cardiothoracic Vascular Surgery)

## 2022-03-20 ENCOUNTER — Ambulatory Visit (INDEPENDENT_AMBULATORY_CARE_PROVIDER_SITE_OTHER): Payer: Medicare Other | Admitting: Thoracic Surgery (Cardiothoracic Vascular Surgery)

## 2022-03-20 VITALS — BP 118/72 | HR 77 | Resp 20 | Wt 206.0 lb

## 2022-03-20 DIAGNOSIS — R590 Localized enlarged lymph nodes: Secondary | ICD-10-CM | POA: Diagnosis not present

## 2022-03-20 NOTE — Progress Notes (Signed)
[address]       [place] [address]             [phone]     HPI: Francisco Austin returns to discuss results of his bronchoscopy  Francisco Austin is a 71 year old male with a history of tobacco abuse, agent orange exposure, hypertension, hyperlipidemia, CAD, arthritis, COPD, and anxiety.  The presented with atypical chest pain.  He had angioplasty and placement of 3 stents earlier this year.  His coronary CT showed fullness in the left hilum.  That was new from a CT in November.  PET/CT showed the nodule was markedly hypermetabolic.  I did a bronch and EBUS on 03/04/2022.  There was a mucosal abnormality in the airway at the origin of the left lower lobe bronchus.  That was biopsied and node aspirations were obtained as well.  He says that he had severe chest pain in the middle of his chest for about 3 days after the procedure.  He did not tell us about it.  That went away and has not recurred.  Past Medical History:  Diagnosis Date   Anxiety    Back pain    CAD (coronary artery disease)    COPD (chronic obstructive pulmonary disease) (HCC)    History of kidney stones    HLD (hyperlipidemia)    HTN (hypertension)    Pre-diabetes      Current Outpatient Medications  Medication Sig Dispense Refill   albuterol (VENTOLIN HFA) 108 (90 Base) MCG/ACT inhaler Inhale 2 puffs into the lungs every 6 (six) hours as needed for wheezing or shortness of breath.     aspirin EC 81 MG tablet Take 81 mg by mouth daily.     atorvastatin (LIPITOR) 80 MG tablet Take 1 tablet (80 mg total) by mouth daily. 90 tablet 1   Cholecalciferol 50 MCG (2000 UT) TABS Take 2,000 Units by mouth daily.     clopidogrel (PLAVIX) 75 MG tablet Take 1 tablet (75 mg total) by mouth daily with breakfast. 90 tablet 3   diclofenac sodium (VOLTAREN) 1 % GEL Apply 1 g topically daily as needed (pain).     diltiazem (TIAZAC) 240 MG 24 hr capsule Take 240 mg by mouth daily.     esomeprazole (NEXIUM) 40 MG  capsule Take 40 mg by mouth daily.     HYDROcodone-acetaminophen (NORCO) 10-325 MG tablet Take 1 tablet by mouth 5 (five) times daily as needed for moderate pain.     LORazepam (ATIVAN) 1 MG tablet Take 1 mg by mouth 3 (three) times daily.     nitroGLYCERIN (NITROSTAT) 0.4 MG SL tablet Place 0.4 mg under the tongue every 5 (five) minutes as needed for chest pain.     telmisartan (MICARDIS) 80 MG tablet Take 80 mg by mouth daily.     umeclidinium-vilanterol (ANORO ELLIPTA) 62.5-25 MCG/INH AEPB Inhale 1 puff into the lungs daily.     vitamin B-12 (CYANOCOBALAMIN) 500 MCG tablet Take 500 mcg by mouth daily.     clopidogrel (PLAVIX) 75 MG tablet Take 75 mg by mouth daily. (Patient not taking: Reported on 03/20/2022)     No current facility-administered medications for this visit.    Physical Exam BP 118/72   Pulse 77   Resp 20   Wt 206 lb (93.4 kg)   SpO2 92% Comment: RA  BMI 28.37 kg/m  71 year old man in no acute distress Alert and oriented x3 with no focal deficits  Diagnostic Tests:  Biopsy of the left lower lobe bronchus showed squamous cell carcinoma.  Aspirations from the 11 L lymph node were positive for small cell carcinoma.  Impression: Francisco Austin is a 71 year old man with a history of tobacco abuse who presented with atypical chest pain.  He was found to have coronary disease and had angioplasty earlier this year.  As part of his work-up he had a CT for the coronaries which showed left hilar fullness.  PET/CT showed a markedly hypermetabolic left hilar lymph node.  Aspirations from that lymph node were positive for small cell carcinoma.  A mucosal abnormality at the origin of the left lower lobe bronchus was biopsied and was positive for squamous cell carcinoma.  Small cell carcinoma-limited stage disease.  Discussed at cancer conference.  Plan will be for combined chemoradiation.  Squamous cell carcinoma-we will be in the treatment field for radiation of the lymph nodes and  will be treated simultaneously.  Chest pain post bronchoscopy-not sure why he did not call about that.  I advised him if he has any additional chest pain he should seek medical attention immediately given his history of CAD.  Plan: MRI of the brain -newly diagnosed small cell lung cancer, rule out mets Has an appointment with Dr. Tammi Klippel on 03/27/2022 We will arrange appointment with Dr. Julien Nordmann as well.  Melrose Nakayama, MD [facility] [phone]

## 2022-03-21 ENCOUNTER — Telehealth: Payer: Self-pay | Admitting: Internal Medicine

## 2022-03-21 ENCOUNTER — Other Ambulatory Visit: Payer: Self-pay | Admitting: *Deleted

## 2022-03-21 DIAGNOSIS — C3490 Malignant neoplasm of unspecified part of unspecified bronchus or lung: Secondary | ICD-10-CM

## 2022-03-21 NOTE — Telephone Encounter (Signed)
Scheduled appt per 5/19 referral. Pt is aware of appt date and time. Pt is aware to arrive 15 mins prior to appt time and to bring and updated insurance card. Pt is aware of appt location.

## 2022-03-24 DIAGNOSIS — F17210 Nicotine dependence, cigarettes, uncomplicated: Secondary | ICD-10-CM | POA: Diagnosis not present

## 2022-03-24 DIAGNOSIS — C3412 Malignant neoplasm of upper lobe, left bronchus or lung: Secondary | ICD-10-CM | POA: Diagnosis not present

## 2022-03-25 NOTE — Progress Notes (Signed)
Thoracic Location of Tumor / Histology:  Small cell carcinoma  Dr. Roxan Hockey 03/20/2022 Biopsy of the left lower lobe bronchus showed squamous cell carcinoma.  Aspirations from the 11 L lymph node were positive for small cell carcinoma.  Impression: As part of his work-up he had a CT for the coronaries which showed left hilar fullness.  PET/CT showed a markedly hypermetabolic left hilar lymph node. Aspirations from that lymph node were positive for small cell carcinoma.  A mucosal abnormality at the origin of the left lower lobe bronchus was biopsied and was positive for squamous cell carcinoma.   Small cell carcinoma-limited stage disease. Discussed at cancer conference.  Plan will be for combined chemoradiation.  Squamous cell carcinoma-we will be in the treatment field for radiation of the lymph nodes and will be treated simultaneously  Plan: MRI of the brain-newly diagnosed small cell lung cancer, rule out mets.  Has an appointment with Dr. Tammi Klippel on 03/27/2022.  We will arrange appointment with Dr. Julien Nordmann as well.    Tobacco/Marijuana/Snuff/ETOH use: Smokes pipe, no to drugs and alcohol.  Past/Anticipated interventions by cardiothoracic surgery, if any:   03/12/2022  VIDEO BRONCHOSCOPY WITH ENDOBRONCHIAL ULTRASOUND (Dx:  Left Hilar Adenopathy)  Past/Anticipated interventions by medical oncology, if any:   Signs/Symptoms Weight changes, if any: {:18581} Respiratory complaints, if any: {:18581} Hemoptysis, if any: {:18581} Pain issues, if any:  {:18581}  SAFETY ISSUES: Prior radiation? {:18581} Pacemaker/ICD? {:18581}  Possible current pregnancy? Male Is the patient on methotrexate? {:18581}  Current Complaints / other details:  ***

## 2022-03-26 ENCOUNTER — Other Ambulatory Visit: Payer: Self-pay

## 2022-03-26 DIAGNOSIS — R931 Abnormal findings on diagnostic imaging of heart and coronary circulation: Secondary | ICD-10-CM

## 2022-03-27 ENCOUNTER — Other Ambulatory Visit: Payer: Self-pay

## 2022-03-27 ENCOUNTER — Ambulatory Visit
Admission: RE | Admit: 2022-03-27 | Discharge: 2022-03-27 | Disposition: A | Discharge status: Home / Self Care | Payer: Medicare Other | Source: Ambulatory Visit | Attending: Radiation Oncology | Admitting: Radiation Oncology

## 2022-03-27 ENCOUNTER — Inpatient Hospital Stay: Payer: Medicare Other | Attending: Internal Medicine | Admitting: Internal Medicine

## 2022-03-27 ENCOUNTER — Inpatient Hospital Stay: Payer: Medicare Other

## 2022-03-27 VITALS — BP 103/67 | HR 72 | Temp 97.0°F | Resp 18 | Ht 71.0 in | Wt 204.0 lb

## 2022-03-27 VITALS — BP 99/62 | HR 63 | Temp 96.4°F | Resp 18 | Wt 203.3 lb

## 2022-03-27 DIAGNOSIS — E785 Hyperlipidemia, unspecified: Secondary | ICD-10-CM | POA: Diagnosis not present

## 2022-03-27 DIAGNOSIS — M1991 Primary osteoarthritis, unspecified site: Secondary | ICD-10-CM | POA: Insufficient documentation

## 2022-03-27 DIAGNOSIS — M549 Dorsalgia, unspecified: Secondary | ICD-10-CM | POA: Diagnosis not present

## 2022-03-27 DIAGNOSIS — G8929 Other chronic pain: Secondary | ICD-10-CM | POA: Diagnosis not present

## 2022-03-27 DIAGNOSIS — Z87442 Personal history of urinary calculi: Secondary | ICD-10-CM | POA: Insufficient documentation

## 2022-03-27 DIAGNOSIS — I10 Essential (primary) hypertension: Secondary | ICD-10-CM | POA: Insufficient documentation

## 2022-03-27 DIAGNOSIS — C3412 Malignant neoplasm of upper lobe, left bronchus or lung: Secondary | ICD-10-CM

## 2022-03-27 DIAGNOSIS — J449 Chronic obstructive pulmonary disease, unspecified: Secondary | ICD-10-CM | POA: Insufficient documentation

## 2022-03-27 DIAGNOSIS — F17210 Nicotine dependence, cigarettes, uncomplicated: Secondary | ICD-10-CM | POA: Insufficient documentation

## 2022-03-27 DIAGNOSIS — Z860100 Personal history of colon polyps, unspecified: Secondary | ICD-10-CM | POA: Insufficient documentation

## 2022-03-27 DIAGNOSIS — Z79899 Other long term (current) drug therapy: Secondary | ICD-10-CM | POA: Insufficient documentation

## 2022-03-27 DIAGNOSIS — K219 Gastro-esophageal reflux disease without esophagitis: Secondary | ICD-10-CM | POA: Insufficient documentation

## 2022-03-27 DIAGNOSIS — E559 Vitamin D deficiency, unspecified: Secondary | ICD-10-CM | POA: Insufficient documentation

## 2022-03-27 DIAGNOSIS — I700 Atherosclerosis of aorta: Secondary | ICD-10-CM | POA: Diagnosis not present

## 2022-03-27 DIAGNOSIS — F419 Anxiety disorder, unspecified: Secondary | ICD-10-CM | POA: Diagnosis not present

## 2022-03-27 DIAGNOSIS — E1169 Type 2 diabetes mellitus with other specified complication: Secondary | ICD-10-CM | POA: Insufficient documentation

## 2022-03-27 DIAGNOSIS — N200 Calculus of kidney: Secondary | ICD-10-CM | POA: Insufficient documentation

## 2022-03-27 DIAGNOSIS — E7800 Pure hypercholesterolemia, unspecified: Secondary | ICD-10-CM | POA: Insufficient documentation

## 2022-03-27 DIAGNOSIS — J432 Centrilobular emphysema: Secondary | ICD-10-CM | POA: Insufficient documentation

## 2022-03-27 DIAGNOSIS — Z7902 Long term (current) use of antithrombotics/antiplatelets: Secondary | ICD-10-CM | POA: Insufficient documentation

## 2022-03-27 DIAGNOSIS — F17200 Nicotine dependence, unspecified, uncomplicated: Secondary | ICD-10-CM

## 2022-03-27 DIAGNOSIS — I2510 Atherosclerotic heart disease of native coronary artery without angina pectoris: Secondary | ICD-10-CM | POA: Diagnosis not present

## 2022-03-27 DIAGNOSIS — C3492 Malignant neoplasm of unspecified part of left bronchus or lung: Secondary | ICD-10-CM

## 2022-03-27 DIAGNOSIS — Z77098 Contact with and (suspected) exposure to other hazardous, chiefly nonmedicinal, chemicals: Secondary | ICD-10-CM | POA: Insufficient documentation

## 2022-03-27 DIAGNOSIS — N400 Enlarged prostate without lower urinary tract symptoms: Secondary | ICD-10-CM | POA: Insufficient documentation

## 2022-03-27 DIAGNOSIS — E538 Deficiency of other specified B group vitamins: Secondary | ICD-10-CM | POA: Insufficient documentation

## 2022-03-27 DIAGNOSIS — Z7982 Long term (current) use of aspirin: Secondary | ICD-10-CM | POA: Insufficient documentation

## 2022-03-27 DIAGNOSIS — R931 Abnormal findings on diagnostic imaging of heart and coronary circulation: Secondary | ICD-10-CM

## 2022-03-27 DIAGNOSIS — Z5111 Encounter for antineoplastic chemotherapy: Secondary | ICD-10-CM | POA: Insufficient documentation

## 2022-03-27 LAB — CMP (CANCER CENTER ONLY)
ALT: 19 U/L (ref 0–44)
AST: 15 U/L (ref 15–41)
Albumin: 4.2 g/dL (ref 3.5–5.0)
Alkaline Phosphatase: 78 U/L (ref 38–126)
Anion gap: 6 (ref 5–15)
BUN: 10 mg/dL (ref 8–23)
CO2: 27 mmol/L (ref 22–32)
Calcium: 9.1 mg/dL (ref 8.9–10.3)
Chloride: 105 mmol/L (ref 98–111)
Creatinine: 0.71 mg/dL (ref 0.61–1.24)
GFR, Estimated: >60 mL/min (ref >60)
Glucose, Bld: 113 mg/dL — ABNORMAL HIGH (ref 70–99)
Potassium: 4.2 mmol/L (ref 3.5–5.1)
Sodium: 138 mmol/L (ref 135–145)
Total Bilirubin: 0.4 mg/dL (ref 0.3–1.2)
Total Protein: 7.2 g/dL (ref 6.5–8.1)

## 2022-03-27 LAB — CBC WITH DIFFERENTIAL (CANCER CENTER ONLY)
Abs Immature Granulocytes: 0.03 10*3/uL (ref 0.00–0.07)
Basophils Absolute: 0.1 10*3/uL (ref 0.0–0.1)
Basophils Relative: 1 %
Eosinophils Absolute: 0.2 10*3/uL (ref 0.0–0.5)
Eosinophils Relative: 3 %
HCT: 40.2 % (ref 39.0–52.0)
Hemoglobin: 13.6 g/dL (ref 13.0–17.0)
Immature Granulocytes: 0 %
Lymphocytes Relative: 29 %
Lymphs Abs: 2 10*3/uL (ref 0.7–4.0)
MCH: 31.1 pg (ref 26.0–34.0)
MCHC: 33.8 g/dL (ref 30.0–36.0)
MCV: 92 fL (ref 80.0–100.0)
Monocytes Absolute: 0.7 10*3/uL (ref 0.1–1.0)
Monocytes Relative: 10 %
Neutro Abs: 3.9 10*3/uL (ref 1.7–7.7)
Neutrophils Relative %: 57 %
Platelet Count: 316 10*3/uL (ref 150–400)
RBC: 4.37 MIL/uL (ref 4.22–5.81)
RDW: 14.2 % (ref 11.5–15.5)
WBC Count: 6.9 10*3/uL (ref 4.0–10.5)
nRBC: 0 % (ref 0.0–0.2)

## 2022-03-27 NOTE — Progress Notes (Signed)
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 Squamous cell carcinoma of bronchus in left upper lobe (HCC)  C34.12     2. Small cell lung cancer, left (HCC)  C34.92       HISTORY OF PRESENT ILLNESS: Francisco Austin is a 70 y.o. male seen at the request of Dr. Mohamed.  He initially presented to the emergency department in February 2022 with complaints of atypical chest pain and subsequently underwent angioplasty with placement of 3 coronary stents and is now on Plavix.  Incidentally, on CT angio for work-up of chest pain, he was noted to have some fullness in the left hilum.  Once he recovered from the angioplasty he had a dedicated chest CT on 01/29/2022 for further evaluation of the left hilum and was found to have a stable appearance of a left paratracheal node but progressive enlargement of left hilar nodes, worrisome for underlying malignancy.  PET scan was performed on 02/26/2022 for further evaluation and confirmed hypermetabolism in the left hilar node, suspicious for bronchogenic carcinoma as well as an associated small AP window node but no suspicious pulmonary nodules.  He met with Dr. Hendrickson on 03/04/2022 and underwent bronchoscopy/EBUS with biopsy and lymph node needle aspirations on 03/12/2022.  Final surgical pathology confirmed NSCLC, squamous cell carcinoma in the LLL lung biopsy but also showed small cell carcinoma on cytology from an 11L node.  He is scheduled for an MRI brain to complete his disease staging on 04/05/2022.  He has been kindly referred to us today to discuss radiation treatment  options in the management of his disease.  He is also scheduled to see Dr. Mohammed today at 11:30 AM.  PREVIOUS RADIATION THERAPY: No  PAST MEDICAL HISTORY:  Past Medical History:  Diagnosis Date   Anxiety    Back pain    CAD (coronary artery disease)    COPD (chronic obstructive pulmonary disease) (HCC)    History of kidney stones    HLD (hyperlipidemia)    HTN (hypertension)    Pre-diabetes       PAST SURGICAL HISTORY: Past Surgical History:  Procedure Laterality Date   APPENDECTOMY     CORONARY STENT INTERVENTION N/A 12/24/2021   Procedure: CORONARY STENT INTERVENTION;  Surgeon: Varanasi, Jayadeep S, MD;  Location: [facility];  Service: Cardiovascular;  Laterality: N/A;   HEMORROIDECTOMY     INTRAVASCULAR ULTRASOUND/IVUS N/A 12/24/2021   Procedure: Intravascular Ultrasound/IVUS;  Surgeon: Varanasi, Jayadeep S, MD;  Location: [facility];  Service: Cardiovascular;  Laterality: N/A;   LEFT HEART CATH AND CORONARY ANGIOGRAPHY N/A 12/23/2021   Procedure: LEFT HEART CATH AND CORONARY ANGIOGRAPHY;  Surgeon: Berry, Jonathan J, MD;  Location: [facility];  Service: Cardiovascular;  Laterality: N/A;   TONSILLECTOMY     VIDEO BRONCHOSCOPY WITH ENDOBRONCHIAL ULTRASOUND N/A 03/12/2022   Procedure: VIDEO BRONCHOSCOPY WITH ENDOBRONCHIAL ULTRASOUND;  Surgeon: Hendrickson, Steven C, MD;  Location: [facility];  Service: Thoracic;  Laterality: N/A;    FAMILY HISTORY:  Family History  Problem Relation Age of Onset   Heart failure Mother      Cancer Father     SOCIAL HISTORY:  Social History   Socioeconomic History   Marital status: Married    Spouse name: Not on file   Number of children: Not on file   Years of education: Not on file   Highest education level: Not on file  Occupational History   Not on file  Tobacco Use   Smoking status: Every Day    Types: Pipe   Smokeless tobacco: Never  Vaping Use   Vaping Use: Never used  Substance and Sexual Activity    Alcohol use: Never   Drug use: Never   Sexual activity: Not Currently  Other Topics Concern   Not on file  Social History Narrative   Not on file   Social Determinants of Health   Financial Resource Strain: Not on file  Food Insecurity: Not on file  Transportation Needs: Not on file  Physical Activity: Not on file  Stress: Not on file  Social Connections: Not on file  Intimate Partner Violence: Not on file    ALLERGIES: Gabapentin, Morphine, Other, Prednisone, Alprazolam, Fentanyl, Niacin, and Penicillamine  MEDICATIONS:  Current Outpatient Medications  Medication Sig Dispense Refill   albuterol (VENTOLIN HFA) 108 (90 Base) MCG/ACT inhaler Inhale 2 puffs into the lungs every 6 (six) hours as needed for wheezing or shortness of breath.     aspirin EC 81 MG tablet Take 81 mg by mouth daily.     atorvastatin (LIPITOR) 80 MG tablet Take 1 tablet (80 mg total) by mouth daily. 90 tablet 1   Cholecalciferol 50 MCG (2000 UT) TABS Take 2,000 Units by mouth daily.     clopidogrel (PLAVIX) 75 MG tablet Take 1 tablet (75 mg total) by mouth daily with breakfast. 90 tablet 3   clopidogrel (PLAVIX) 75 MG tablet Take 75 mg by mouth daily. (Patient not taking: Reported on 03/20/2022)     diclofenac sodium (VOLTAREN) 1 % GEL Apply 1 g topically daily as needed (pain).     diltiazem (TIAZAC) 240 MG 24 hr capsule Take 240 mg by mouth daily.     esomeprazole (NEXIUM) 40 MG capsule Take 40 mg by mouth daily.     HYDROcodone-acetaminophen (NORCO) 10-325 MG tablet Take 1 tablet by mouth 5 (five) times daily as needed for moderate pain.     LORazepam (ATIVAN) 1 MG tablet Take 1 mg by mouth 3 (three) times daily.     nitroGLYCERIN (NITROSTAT) 0.4 MG SL tablet Place 0.4 mg under the tongue every 5 (five) minutes as needed for chest pain.     telmisartan (MICARDIS) 80 MG tablet Take 80 mg by mouth daily.     umeclidinium-vilanterol (ANORO ELLIPTA) 62.5-25 MCG/INH AEPB Inhale 1 puff into the lungs daily.      vitamin B-12 (CYANOCOBALAMIN) 500 MCG tablet Take 500 mcg by mouth daily.     No current facility-administered medications for this encounter.    REVIEW OF SYSTEMS:  On review of systems, the patient reports that he is doing well overall. He denies any chest pain, shortness of breath, cough, fevers, chills, night sweats, unintended weight changes.  He denies any bowel or bladder disturbances, and denies abdominal pain, nausea or vomiting.  He denies any new musculoskeletal or joint aches or pains. A complete review of systems is obtained and is otherwise negative.    PHYSICAL EXAM:  Wt Readings from Last 3 Encounters:  03/27/22 204 lb (92.5 kg)  03/20/22 206 lb (93.4 kg)  03/12/22 201 lb (91.2  kg)   Temp Readings from Last 3 Encounters:  03/27/22 (!) 97 F (36.1 C) (Temporal)  03/12/22 98.2 F (36.8 C)  03/10/22 97.7 F (36.5 C) (Oral)   BP Readings from Last 3 Encounters:  03/27/22 103/67  03/20/22 118/72  03/12/22 (!) 108/54   Pulse Readings from Last 3 Encounters:  03/27/22 72  03/20/22 77  03/12/22 77   Pain Assessment Pain Score: 7  Pain Loc: Chest (chest, spine, headache)/10  In general this is a well appearing Caucasian male in no acute distress.  He's alert and oriented x4 and appropriate throughout the examination. Cardiopulmonary assessment is negative for acute distress and he exhibits normal effort.   KPS = 100  100 - Normal; no complaints; no evidence of disease. 90   - Able to carry on normal activity; minor signs or symptoms of disease. 80   - Normal activity with effort; some signs or symptoms of disease. 70   - Cares for self; unable to carry on normal activity or to do active work. 60   - Requires occasional assistance, but is able to care for most of his personal needs. 50   - Requires considerable assistance and frequent medical care. 40   - Disabled; requires special care and assistance. 30   - Severely disabled; hospital admission is indicated  although death not imminent. 20   - Very sick; hospital admission necessary; active supportive treatment necessary. 10   - Moribund; fatal processes progressing rapidly. 0     - Dead  Karnofsky DA, Abelmann WH, Craver LS and Burchenal JH (1948) The use of the nitrogen mustards in the palliative treatment of carcinoma: with particular reference to bronchogenic carcinoma Cancer 1 634-56  LABORATORY DATA:  Lab Results  Component Value Date   WBC 7.7 03/10/2022   HGB 14.8 03/10/2022   HCT 45.1 03/10/2022   MCV 94.5 03/10/2022   PLT 308 03/10/2022   Lab Results  Component Value Date   NA 137 03/10/2022   K 3.8 03/10/2022   CL 105 03/10/2022   CO2 24 03/10/2022   Lab Results  Component Value Date   ALT 21 03/10/2022   AST 21 03/10/2022   ALKPHOS 78 03/10/2022   BILITOT 0.3 03/10/2022     RADIOGRAPHY: DG Chest 2 View  Result Date: 03/11/2022 CLINICAL DATA:  Preop exam for lymph node biopsy. EXAM: CHEST - 2 VIEW COMPARISON:  PET-CT 02/26/2022. CT chest 01/29/2022. Chest x-ray 09/06/2021. FINDINGS: Mediastinum is unremarkable. Heart size stable. No pulmonary venous congestion. Coronary artery calcification. Previously identified PET positive left perihilar lymph node best identified by prior PET-CT. Low lung volumes with mild bibasilar atelectasis and or scarring. No pleural effusion or pneumothorax. Biapical pleural thickening consistent with scarring. No acute bony abnormality identified. IMPRESSION: 1. Coronary artery calcification. Heart size is stable. No pulmonary venous congestion. 2. Previously identified PET positive left perihilar lymph node best identified by prior PET-CT. Low lung volumes with mild bibasilar atelectasis and or scarring. Electronically Signed   By: Thomas  Register M.D.   On: 03/11/2022 09:15   NM PET Image Initial (PI) Skull Base To Thigh (F-18 FDG)  Result Date: 02/26/2022 CLINICAL DATA:  Initial treatment strategy for mediastinal lymphadenopathy. EXAM:  NUCLEAR MEDICINE PET SKULL BASE TO THIGH TECHNIQUE: 10.0 mCi F-18 FDG was injected intravenously. Full-ring PET imaging was performed from the skull base to thigh after the radiotracer. CT data was obtained and used for attenuation correction and anatomic localization. Fasting blood glucose: 109 mg/dl   COMPARISON:  CT chest dated 01/29/2022 FINDINGS: Mediastinal blood pool activity: SUV max 3.3 Liver activity: SUV max NA NECK: No hypermetabolic cervical lymphadenopathy. Incidental CT findings: none CHEST: 15 mm short axis left perihilar node (series 4/image 85), max SUV 7.5. 12 mm short axis AP window node (series 4/image 77), max SUV 3.9, indeterminate. 11 mm short axis subcarinal node (series 4/image 86), non FDG avid. No suspicious pulmonary nodules. Incidental CT findings: Mild biapical pleural-parenchymal scarring. Mild centrilobular and paraseptal emphysematous changes. Mild bilateral lower lobe atelectasis. No pleural effusion or pneumothorax. Three vessel coronary atherosclerosis. Atherosclerotic calcifications of the arch. ABDOMEN/PELVIS: No abnormal hypermetabolism in the liver, spleen, pancreas, or adrenal glands. No hypermetabolic abdominopelvic lymphadenopathy. Incidental CT findings: Nonobstructing right renal calculi measuring up to 11 mm in the right lower pole (series 4/image 135). Atherosclerotic calcifications of the abdominal aorta and branch vessels. SKELETON: No focal hypermetabolic activity to suggest skeletal metastasis. Incidental CT findings: none IMPRESSION: Hypermetabolic left perihilar node, suspicious for bronchogenic malignancy. Associated small AP window node is indeterminate. Otherwise, no findings suspicious for metastatic disease. Electronically Signed   By: Sriyesh  Krishnan M.D.   On: 02/26/2022 22:36      IMPRESSION/PLAN: 1. 70 y.o. 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 Today, we talked to the  patient about the findings and workup thus far. We discussed the natural history of limited stage small cell carcinoma and general treatment, highlighting the role of radiotherapy in the management. We discussed the available radiation techniques, and focused on the details and logistics of delivery. The recommendation is for a 6.5 week course of daily external beam radiation concurrent with systemic chemotherapy. We reviewed the anticipated acute and late sequelae associated with radiation in this setting. The patient was encouraged to ask questions that were answered to his stated satisfaction.  At the conclusion of our conversation, he is in agreement to proceed with the recommended 6.5 week course of daily external beam radiation concurrent with systemic chemotherapy. He appears to have a good understanding of his disease and is comfortable and in agreement with the plan.  He has freely signed written consent to proceed today in the office and a copy of this document will be placed in his chart.  He is tentatively scheduled for CT simulation at 10:30 AM on Friday, 03/28/2022, in anticipation of beginning his daily treatments the week of 04/07/2022.  We will share our discussion with Dr. Mohamed so that his treatment can be coordinated with the start of chemotherapy.  He will meet with Dr. Mohammed at 11:30 AM this morning to further discuss the systemic therapy component of his treatment.  We enjoyed meeting him today and look forward to continuing to participate in his care.  We personally spent 60 minutes in this encounter including chart review, reviewing radiological studies, meeting face-to-face with the patient, entering orders and completing documentation.    Ashlyn W. Bruning, PA-C    Matthew Manning, MD  [facility] Direct Dial: [phone]  Fax: [phone] [email or web address]  Skype  LinkedIn          

## 2022-03-27 NOTE — Progress Notes (Signed)
  Radiation Oncology         [phone] ________________________________  Name: Francisco Austin MRN: [id number]  Date: 03/28/2022  DOB: [birth year]  SIMULATION AND TREATMENT PLANNING NOTE    ICD-10-CM   1. 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  Identity was confirmed.  All relevant records and images related to the planned course of therapy were reviewed.  The patient freely provided informed written consent to proceed with treatment after reviewing the details related to the planned course of therapy. The consent form was witnessed and verified by the simulation staff.  Then, the patient was set-up in a stable reproducible  supine position for radiation therapy.  CT images were obtained.  Surface markings were placed.  The CT images were loaded into the planning software.  Then the target and avoidance structures were contoured.  Treatment planning then occurred.  The radiation prescription was entered and confirmed.  Then, I designed and supervised the construction of a total of 6 medically necessary complex treatment devices, including a BodyFix immobilization mold custom fitted to the patient along with 5 multileaf collimators conformally shaped radiation around the treatment target while shielding critical structures such as the heart and spinal cord maximally.  I have requested : 3D Simulation  I have requested a DVH of the following structures: Left lung, right lung, spinal cord, heart, esophagus, and target.  I have ordered:Nutrition Consult  SPECIAL TREATMENT PROCEDURE:  The planned course of therapy using radiation constitutes a special treatment procedure. Special care is required in the management of this patient for the following reasons.  The patient will be receiving  concurrent chemotherapy requiring careful monitoring for increased toxicities of treatment including periodic laboratory values.  The special nature of the planned course of radiotherapy will require increased physician supervision and oversight to ensure patient's safety with optimal treatment outcomes.  PLAN:  The patient will receive 66 Gy in 33 fractions.  ________________________________  Sheral Apley Tammi Klippel, M.D.

## 2022-03-27 NOTE — Progress Notes (Signed)
[facility] [phone]   [phone]  CONSULT NOTE  REFERRING PHYSICIAN: Dr. Modesto Charon  REASON FOR CONSULTATION:  71 years old white male recently diagnosed with lung cancer.  HPI Francisco Austin is a 71 y.o. male with past medical history significant for COPD, coronary artery disease, hypertension, dyslipidemia, prediabetes, chronic back pain as well as anxiety and history of smoking.  The patient was complaining of nonspecific chest pain and had CT of the coronaries performed on December 16, 2021.  The CT portion of the scan showed enlarged left hilar lymph node new from the September 06, 2021 scan.  This was followed by CT scan of the chest with contrast on January 29, 2022 and showed the enlarged left hilar node measuring 2.5 x 1.7 centimeters newly enlarged from September 06, 2021.  There was additional small mediastinal lymph nodes including 1.3 cm AP window lymph node and a subcarinal node measuring 1.3 cm.  There was also a cystic and part solid left upper lobe nodule measuring 2.1 x 1.2 cm.  The patient was referred to Dr. Roxan Hockey and a PET scan was performed on 02/26/2022 and it showed hypermetabolic left perihilar nodes suspicious for bronchogenic malignancy.  There was also associated a small AP window lymph node that indeterminate.  There was no evidence of distant metastatic disease.  On Mar 12, 2022 the patient underwent bronchoscopy with brushing and endobronchial biopsies as well as EBUS with lymph node biopsy under the care of Dr. Roxan Hockey. The final pathology [id number]) from the biopsy of the left lower lobe of the lung showed moderate differentiated squamous cell carcinoma.  Fine-needle aspiration of the 11 L lymph node showed small cell carcinoma. Dr. Roxan Hockey kindly referred the patient to me today for evaluation and recommendation regarding treatment of his condition. When seen today the patient is feeling fine except for  the chronic back pain and he is currently on pain medication by Dr. [name] his primary care physician.  He also has intermittent chest pain but this usually related to his heart disease.  He has no shortness of breath but has mild cough with no hemoptysis.  He has no nausea, vomiting, diarrhea but has constipation secondary to narcotic treatment.  The patient also has migraine headache with no visual changes. Family history significant for mother with congestive heart failure.  Father had mesothelioma. The patient is married and has 2 children a son and daughter.  He used to work as an Agricultural consultant.  He has a history of smoking cigarettes less than 1 pack/day for around 25 years but quit smoking cigarette 30 years ago and started smoking Pipes at regular basis.  He has no history of alcohol abuse but has history drug abuse including heroin LSD and others in the past not recently. HPI  Past Medical History:  Diagnosis Date   Anxiety    Back pain    CAD (coronary artery disease)    COPD (chronic obstructive pulmonary disease) (HCC)    History of kidney stones    HLD (hyperlipidemia)    HTN (hypertension)    Pre-diabetes     Past Surgical History:  Procedure Laterality Date   APPENDECTOMY     CORONARY STENT INTERVENTION N/A 12/24/2021   Procedure: CORONARY STENT INTERVENTION;  Surgeon: Jettie Booze, MD;  Location: [facility];  Service: Cardiovascular;  Laterality: N/A;   HEMORROIDECTOMY     INTRAVASCULAR ULTRASOUND/IVUS N/A 12/24/2021   Procedure: Intravascular Ultrasound/IVUS;  Surgeon:  Jettie Booze, MD;  Location: [facility];  Service: Cardiovascular;  Laterality: N/A;   LEFT HEART CATH AND CORONARY ANGIOGRAPHY N/A 12/23/2021   Procedure: LEFT HEART CATH AND CORONARY ANGIOGRAPHY;  Surgeon: Lorretta Harp, MD;  Location: [facility];  Service: Cardiovascular;  Laterality: N/A;   TONSILLECTOMY     VIDEO BRONCHOSCOPY WITH ENDOBRONCHIAL ULTRASOUND N/A  03/12/2022   Procedure: VIDEO BRONCHOSCOPY WITH ENDOBRONCHIAL ULTRASOUND;  Surgeon: Melrose Nakayama, MD;  Location: [facility];  Service: Thoracic;  Laterality: N/A;    Family History  Problem Relation Age of Onset   Heart failure Mother    Cancer Father     Social History Social History   Tobacco Use   Smoking status: Every Day    Types: Pipe   Smokeless tobacco: Never  Vaping Use   Vaping Use: Never used  Substance Use Topics   Alcohol use: Never   Drug use: Never    Allergies  Allergen Reactions   Gabapentin Anaphylaxis and Other (See Comments)    B/P dropped to "40"    Morphine Other (See Comments)    Burn his veins   Other Anaphylaxis    Androgenic Anabolic Steroid   Prednisone Anaphylaxis   Alprazolam Other (See Comments)    Other reaction(s): weirds him out   Fentanyl     Does not relieve pain for him    Niacin Other (See Comments)    Body feel weird   Penicillamine Hives    Current Outpatient Medications  Medication Sig Dispense Refill   albuterol (VENTOLIN HFA) 108 (90 Base) MCG/ACT inhaler Inhale 2 puffs into the lungs every 6 (six) hours as needed for wheezing or shortness of breath.     aspirin EC 81 MG tablet Take 81 mg by mouth daily.     atorvastatin (LIPITOR) 80 MG tablet Take 1 tablet (80 mg total) by mouth daily. 90 tablet 1   Cholecalciferol 50 MCG (2000 UT) TABS Take 2,000 Units by mouth daily.     clopidogrel (PLAVIX) 75 MG tablet Take 1 tablet (75 mg total) by mouth daily with breakfast. 90 tablet 3   clopidogrel (PLAVIX) 75 MG tablet Take 75 mg by mouth daily. (Patient not taking: Reported on 03/20/2022)     diclofenac sodium (VOLTAREN) 1 % GEL Apply 1 g topically daily as needed (pain).     diltiazem (TIAZAC) 240 MG 24 hr capsule Take 240 mg by mouth daily.     esomeprazole (NEXIUM) 40 MG capsule Take 40 mg by mouth daily.     HYDROcodone-acetaminophen (NORCO) 10-325 MG tablet Take 1 tablet by mouth 5 (five) times daily as needed for  moderate pain.     LORazepam (ATIVAN) 1 MG tablet Take 1 mg by mouth 3 (three) times daily.     nitroGLYCERIN (NITROSTAT) 0.4 MG SL tablet Place 0.4 mg under the tongue every 5 (five) minutes as needed for chest pain.     telmisartan (MICARDIS) 80 MG tablet Take 80 mg by mouth daily.     umeclidinium-vilanterol (ANORO ELLIPTA) 62.5-25 MCG/INH AEPB Inhale 1 puff into the lungs daily.     vitamin B-12 (CYANOCOBALAMIN) 500 MCG tablet Take 500 mcg by mouth daily.     No current facility-administered medications for this visit.    Review of Systems  Constitutional: positive for fatigue Eyes: negative Ears, nose, mouth, throat, and face: negative Respiratory: positive for cough and pleurisy/chest pain Cardiovascular: negative Gastrointestinal: positive for constipation Genitourinary:negative Integument/breast: negative  Hematologic/lymphatic: negative Musculoskeletal:positive for back pain Neurological: positive for headaches Behavioral/Psych: negative Endocrine: negative Allergic/Immunologic: negative  Physical Exam  ZRA:QTMAU, healthy, no distress, well nourished, and well developed SKIN: skin color, texture, turgor are normal, no rashes or significant lesions HEAD: Normocephalic, No masses, lesions, tenderness or abnormalities EYES: normal, PERRLA, Conjunctiva are pink and non-injected EARS: External ears normal, Canals clear OROPHARYNX:no exudate, no erythema, and lips, buccal mucosa, and tongue normal  NECK: supple, no adenopathy, no JVD LYMPH:  no palpable lymphadenopathy, no hepatosplenomegaly LUNGS: clear to auscultation , and palpation HEART: regular rate & rhythm, no murmurs, and no gallops ABDOMEN:abdomen soft, non-tender, normal bowel sounds, and no masses or organomegaly BACK: Back symmetric, no curvature., No CVA tenderness EXTREMITIES:no joint deformities, effusion, or inflammation, no edema  NEURO: alert & oriented x 3 with fluent speech, no focal motor/sensory  deficits  PERFORMANCE STATUS: ECOG 1  LABORATORY DATA: Lab Results  Component Value Date   WBC 6.9 03/27/2022   HGB 13.6 03/27/2022   HCT 40.2 03/27/2022   MCV 92.0 03/27/2022   PLT 316 03/27/2022      Chemistry      Component Value Date/Time   NA 138 03/27/2022 0943   NA 140 12/13/2021 0900   K 4.2 03/27/2022 0943   CL 105 03/27/2022 0943   CO2 27 03/27/2022 0943   BUN 10 03/27/2022 0943   BUN 9 12/13/2021 0900   CREATININE 0.71 03/27/2022 0943      Component Value Date/Time   CALCIUM 9.1 03/27/2022 0943   ALKPHOS 78 03/27/2022 0943   AST 15 03/27/2022 0943   ALT 19 03/27/2022 0943   BILITOT 0.4 03/27/2022 0943       RADIOGRAPHIC STUDIES: DG Chest 2 View  Result Date: 03/11/2022 CLINICAL DATA:  Preop exam for lymph node biopsy. EXAM: CHEST - 2 VIEW COMPARISON:  PET-CT 02/26/2022. CT chest 01/29/2022. Chest x-ray 09/06/2021. FINDINGS: Mediastinum is unremarkable. Heart size stable. No pulmonary venous congestion. Coronary artery calcification. Previously identified PET positive left perihilar lymph node best identified by prior PET-CT. Low lung volumes with mild bibasilar atelectasis and or scarring. No pleural effusion or pneumothorax. Biapical pleural thickening consistent with scarring. No acute bony abnormality identified. IMPRESSION: 1. Coronary artery calcification. Heart size is stable. No pulmonary venous congestion. 2. Previously identified PET positive left perihilar lymph node best identified by prior PET-CT. Low lung volumes with mild bibasilar atelectasis and or scarring. Electronically Signed   By: Marcello Moores  Register M.D.   On: 03/11/2022 09:15   NM PET Image Initial (PI) Skull Base To Thigh (F-18 FDG)  Result Date: 02/26/2022 CLINICAL DATA:  Initial treatment strategy for mediastinal lymphadenopathy. EXAM: NUCLEAR MEDICINE PET SKULL BASE TO THIGH TECHNIQUE: 10.0 mCi F-18 FDG was injected intravenously. Full-ring PET imaging was performed from the skull base to  thigh after the radiotracer. CT data was obtained and used for attenuation correction and anatomic localization. Fasting blood glucose: 109 mg/dl COMPARISON:  CT chest dated 01/29/2022 FINDINGS: Mediastinal blood pool activity: SUV max 3.3 Liver activity: SUV max NA NECK: No hypermetabolic cervical lymphadenopathy. Incidental CT findings: none CHEST: 15 mm short axis left perihilar node (series 4/image 85), max SUV 7.5. 12 mm short axis AP window node (series 4/image 77), max SUV 3.9, indeterminate. 11 mm short axis subcarinal node (series 4/image 86), non FDG avid. No suspicious pulmonary nodules. Incidental CT findings: Mild biapical pleural-parenchymal scarring. Mild centrilobular and paraseptal emphysematous changes. Mild bilateral lower lobe atelectasis. No pleural effusion or pneumothorax. Three  vessel coronary atherosclerosis. Atherosclerotic calcifications of the arch. ABDOMEN/PELVIS: No abnormal hypermetabolism in the liver, spleen, pancreas, or adrenal glands. No hypermetabolic abdominopelvic lymphadenopathy. Incidental CT findings: Nonobstructing right renal calculi measuring up to 11 mm in the right lower pole (series 4/image 135). Atherosclerotic calcifications of the abdominal aorta and branch vessels. SKELETON: No focal hypermetabolic activity to suggest skeletal metastasis. Incidental CT findings: none IMPRESSION: Hypermetabolic left perihilar node, suspicious for bronchogenic malignancy. Associated small AP window node is indeterminate. Otherwise, no findings suspicious for metastatic disease. Electronically Signed   By: Julian Hy M.D.   On: 02/26/2022 22:36    ASSESSMENT: This is a very pleasant 71 years old white male recently diagnosed with limited stage (T1c, N2, M0) small cell lung cancer with mixture of squamous cell carcinoma in the left upper lobe presented with left upper lobe lung nodule in addition to left hilar and mediastinal lymphadenopathy diagnosed in May 2023.   PLAN: I  had a lengthy discussion with the patient today about his current disease stage, prognosis and treatment options. I personally and independently reviewed the scan images and discussed the result and showed the images to the patient today. I recommended for the patient to complete the staging work-up and he is scheduled to have MRI of the brain in around 1 week. I discussed with the patient his treatment options and currently he has a curable condition with the limited stage disease of the lung cancer. I recommended for him systemic chemotherapy with cisplatin 75 Mg/M2 on day 1 and etoposide 100 Mg/M2 on days 1, 2 and 3 every 3 weeks for 4 cycles.  This will be concurrent with radiotherapy for around 6 weeks during this course of treatment. I discussed with the patient the adverse effect of the chemotherapy including but not limited to alopecia, myelosuppression, nausea and vomiting, peripheral neuropathy, liver or renal dysfunction as well as hearing deficit from cisplatin. The patient would like to proceed with the treatment as planned and he is expected to start the first cycle of this treatment on April 07, 2022. The patient was seen by Dr. Tammi Klippel from radiation oncology and he is expected to start radiotherapy at that time to. He will have a chemotherapy education class before the first dose of his treatment. I will call his pharmacy with prescription for Compazine 10 mg p.o. every 6 hours as needed for nausea. For the pain management, the patient will continue his treatment for the chronic pain under the care of Dr. [name]. The patient will come back for follow-up visit 1 week after the start of his treatment for management of any adverse effect of his chemotherapy. He was advised to call immediately if he has any other concerning symptoms in the interval. The patient voices understanding of current disease status and treatment options and is in agreement with the current care plan.  All questions  were answered. The patient knows to call the clinic with any problems, questions or concerns. We can certainly see the patient much sooner if necessary.  Thank you so much for allowing me to participate in the care of Francisco Austin. I will continue to follow up the patient with you and assist in his care. The total time spent in the appointment was 90 minutes.  Disclaimer: This note was dictated with voice recognition software. Similar sounding words can inadvertently be transcribed and may not be corrected upon review.   Eilleen Kempf Mar 27, 2022, 11:40 AM

## 2022-03-27 NOTE — Progress Notes (Signed)
START ON PATHWAY REGIMEN - Small Cell Lung     A cycle is every 21 days:     Etoposide      Cisplatin   **Always confirm dose/schedule in your pharmacy ordering system**  Patient Characteristics: Newly Diagnosed, Preoperative or Nonsurgical Candidate (Clinical Staging), First Line, Limited Stage, Nonsurgical Candidate Therapeutic Status: Newly Diagnosed, Preoperative or Nonsurgical Candidate (Clinical Staging) AJCC T Category: cT1c AJCC N Category: cN2 AJCC M Category: cM0 AJCC 8 Stage Grouping: IIIA Stage Classification: Limited Surgical Candidacy: Nonsurgical Candidate Intent of Therapy: Curative Intent, Discussed with Patient

## 2022-03-28 ENCOUNTER — Ambulatory Visit
Admission: RE | Admit: 2022-03-28 | Discharge: 2022-03-28 | Disposition: A | Discharge status: Home / Self Care | Payer: Medicare Other | Source: Ambulatory Visit | Attending: Radiation Oncology | Admitting: Radiation Oncology

## 2022-03-28 ENCOUNTER — Telehealth: Payer: Self-pay | Admitting: Internal Medicine

## 2022-03-28 DIAGNOSIS — F17210 Nicotine dependence, cigarettes, uncomplicated: Secondary | ICD-10-CM | POA: Diagnosis not present

## 2022-03-28 DIAGNOSIS — C3492 Malignant neoplasm of unspecified part of left bronchus or lung: Secondary | ICD-10-CM

## 2022-03-28 DIAGNOSIS — C3412 Malignant neoplasm of upper lobe, left bronchus or lung: Secondary | ICD-10-CM | POA: Diagnosis not present

## 2022-03-28 NOTE — Telephone Encounter (Signed)
Scheduled per 05/25 los, patient has been called and notified of upcoming appointment.

## 2022-04-01 ENCOUNTER — Encounter: Payer: Self-pay | Admitting: Internal Medicine

## 2022-04-01 ENCOUNTER — Ambulatory Visit
Admission: RE | Admit: 2022-04-01 | Discharge: 2022-04-01 | Disposition: A | Discharge status: Home / Self Care | Payer: Medicare Other | Source: Ambulatory Visit | Attending: Thoracic Surgery (Cardiothoracic Vascular Surgery) | Admitting: Thoracic Surgery (Cardiothoracic Vascular Surgery)

## 2022-04-01 DIAGNOSIS — C7931 Secondary malignant neoplasm of brain: Secondary | ICD-10-CM | POA: Diagnosis not present

## 2022-04-01 DIAGNOSIS — F17210 Nicotine dependence, cigarettes, uncomplicated: Secondary | ICD-10-CM | POA: Diagnosis not present

## 2022-04-01 DIAGNOSIS — C3490 Malignant neoplasm of unspecified part of unspecified bronchus or lung: Secondary | ICD-10-CM

## 2022-04-01 DIAGNOSIS — C3412 Malignant neoplasm of upper lobe, left bronchus or lung: Secondary | ICD-10-CM | POA: Diagnosis not present

## 2022-04-01 DIAGNOSIS — C3492 Malignant neoplasm of unspecified part of left bronchus or lung: Secondary | ICD-10-CM | POA: Diagnosis not present

## 2022-04-01 MED ORDER — GADOBENATE DIMEGLUMINE 529 MG/ML IV SOLN
19.0000 mL | Freq: Once | INTRAVENOUS | Status: AC | PRN
  Administered 2022-04-01: 19 mL via INTRAVENOUS

## 2022-04-02 ENCOUNTER — Telehealth: Payer: Self-pay

## 2022-04-02 NOTE — Progress Notes (Signed)
Pharmacist Chemotherapy Monitoring - Initial Assessment    Anticipated start date: 04/08/22   The following has been reviewed per standard work regarding the patient's treatment regimen: The patient's diagnosis, treatment plan and drug doses, and organ/hematologic function Lab orders and baseline tests specific to treatment regimen  The treatment plan start date, drug sequencing, and pre-medications Prior authorization status  Patient's documented medication list, including drug-drug interaction screen and prescriptions for anti-emetics and supportive care specific to the treatment regimen The drug concentrations, fluid compatibility, administration routes, and timing of the medications to be used The patient's access for treatment and lifetime cumulative dose history, if applicable  The patient's medication allergies and previous infusion related reactions, if applicable   Changes made to treatment plan:  N/A  Follow up needed:  prescriptions needed for anti-emetics   Kennith Center, Pharm.D., CPP 04/02/2022@1 :56 PM

## 2022-04-02 NOTE — Telephone Encounter (Signed)
I spoke with pt and his wife and advised the MyChart changes occur in real time, therefore when the schedule is being built, they will see notifications for each as they are occurring. Pt and his wife expressed understanding of this information.

## 2022-04-03 ENCOUNTER — Other Ambulatory Visit: Payer: Self-pay | Admitting: Physician Assistant

## 2022-04-03 DIAGNOSIS — C3492 Malignant neoplasm of unspecified part of left bronchus or lung: Secondary | ICD-10-CM

## 2022-04-03 MED ORDER — PROCHLORPERAZINE MALEATE 10 MG PO TABS: 10.0000 mg | ORAL_TABLET | Freq: Four times a day (QID) | ORAL | 2 refills | Status: DC | PRN

## 2022-04-04 ENCOUNTER — Inpatient Hospital Stay: Payer: Medicare Other

## 2022-04-04 DIAGNOSIS — N200 Calculus of kidney: Secondary | ICD-10-CM | POA: Insufficient documentation

## 2022-04-04 DIAGNOSIS — Z7982 Long term (current) use of aspirin: Secondary | ICD-10-CM | POA: Insufficient documentation

## 2022-04-04 DIAGNOSIS — F419 Anxiety disorder, unspecified: Secondary | ICD-10-CM | POA: Insufficient documentation

## 2022-04-04 DIAGNOSIS — E785 Hyperlipidemia, unspecified: Secondary | ICD-10-CM | POA: Insufficient documentation

## 2022-04-04 DIAGNOSIS — Z79899 Other long term (current) drug therapy: Secondary | ICD-10-CM | POA: Insufficient documentation

## 2022-04-04 DIAGNOSIS — Z5111 Encounter for antineoplastic chemotherapy: Secondary | ICD-10-CM | POA: Insufficient documentation

## 2022-04-04 DIAGNOSIS — I10 Essential (primary) hypertension: Secondary | ICD-10-CM | POA: Insufficient documentation

## 2022-04-04 DIAGNOSIS — R110 Nausea: Secondary | ICD-10-CM | POA: Insufficient documentation

## 2022-04-04 DIAGNOSIS — J432 Centrilobular emphysema: Secondary | ICD-10-CM | POA: Insufficient documentation

## 2022-04-04 DIAGNOSIS — I2510 Atherosclerotic heart disease of native coronary artery without angina pectoris: Secondary | ICD-10-CM | POA: Insufficient documentation

## 2022-04-04 DIAGNOSIS — R059 Cough, unspecified: Secondary | ICD-10-CM | POA: Insufficient documentation

## 2022-04-04 DIAGNOSIS — G8929 Other chronic pain: Secondary | ICD-10-CM | POA: Insufficient documentation

## 2022-04-04 DIAGNOSIS — F17210 Nicotine dependence, cigarettes, uncomplicated: Secondary | ICD-10-CM | POA: Insufficient documentation

## 2022-04-04 DIAGNOSIS — C3412 Malignant neoplasm of upper lobe, left bronchus or lung: Secondary | ICD-10-CM | POA: Insufficient documentation

## 2022-04-05 ENCOUNTER — Other Ambulatory Visit: Payer: Medicare Other

## 2022-04-07 ENCOUNTER — Ambulatory Visit
Admission: RE | Admit: 2022-04-07 | Discharge: 2022-04-07 | Disposition: A | Discharge status: Home / Self Care | Payer: Medicare Other | Source: Ambulatory Visit | Attending: Radiation Oncology | Admitting: Radiation Oncology

## 2022-04-07 ENCOUNTER — Other Ambulatory Visit: Payer: Self-pay

## 2022-04-07 DIAGNOSIS — F17210 Nicotine dependence, cigarettes, uncomplicated: Secondary | ICD-10-CM | POA: Diagnosis not present

## 2022-04-07 DIAGNOSIS — Z510 Encounter for antineoplastic radiation therapy: Secondary | ICD-10-CM | POA: Diagnosis not present

## 2022-04-07 DIAGNOSIS — C3412 Malignant neoplasm of upper lobe, left bronchus or lung: Secondary | ICD-10-CM | POA: Diagnosis not present

## 2022-04-07 DIAGNOSIS — C3492 Malignant neoplasm of unspecified part of left bronchus or lung: Secondary | ICD-10-CM | POA: Insufficient documentation

## 2022-04-07 LAB — RAD ONC ARIA SESSION SUMMARY
Course Elapsed Days: 0
Plan Fractions Treated to Date: 1
Plan Prescribed Dose Per Fraction: 2 Gy
Plan Total Fractions Prescribed: 33
Plan Total Prescribed Dose: 66 Gy
Reference Point Dosage Given to Date: 2 Gy
Reference Point Session Dosage Given: 2 Gy
Session Number: 1

## 2022-04-07 MED FILL — Fosaprepitant Dimeglumine For IV Infusion 150 MG (Base Eq): INTRAVENOUS | Qty: 5 | Status: AC

## 2022-04-07 NOTE — Progress Notes (Signed)
[facility] OFFICE PROGRESS NOTE  [name], MD [place] [address]  DIAGNOSIS:  Limited stage (T1c, N2, M0) small cell lung cancer with mixture of squamous cell carcinoma in the left upper lobe presented with left upper lobe lung nodule in addition to left hilar and mediastinal lymphadenopathy diagnosed in May 2023.  PRIOR THERAPY: None  CURRENT THERAPY: Systemic chemotherapy with cisplatin 75 mg per metered squared on day 1 and etoposide 100 mg per metered squared on days 1, 2, and 3 IV every 3 weeks.  The patient will also receive concurrent radiation.  First dose on 04/08/2022.  Status post 1 cycle  INTERVAL HISTORY: Francisco Austin 71 y.o. male returns to the clinic today for a follow-up visit.  Unfortunately, the patient was recently diagnosed with limited stage small cell lung cancer.  The patient is currently undergoing chemotherapy and radiation treatment.  The last day radiation is tentatively scheduled for 05/22/2022.  The patient underwent his first cycle of chemotherapy last week and he tolerated it fairly well. He had about 6 bouts of diarrhea; however, once he learned he could take imodium, it completely resolved his diarrhea. He also notes he needed to take his anti-emetic twice which also resolved his queasiness. He denies associated vomiting. Today he denies any fever, chills, night sweats, or unexplained weight loss.  He has a mild cough which he associates with sinus drainage. He reports he may have some mild dyspnea "every once in awhile" but nothing out of the ordinary recent. He denies any hemoptysis or chest discomfort. He has a history of migraines and some visual changes secondary to cataracts and lenses but denies any unusual headaches or vision changes  He is followed closely by his PCP for pain management of his chronic back pain. The patient recently had a brain MRI to complete the staging work-up which was fortunately negative  for any metastatic disease to the brain.  The patient is here today for evaluation of 1 week follow-up visit to manage any adverse side effects of treatment.  MEDICAL HISTORY: Past Medical History:  Diagnosis Date   Anxiety    Back pain    CAD (coronary artery disease)    COPD (chronic obstructive pulmonary disease) (HCC)    History of kidney stones    HLD (hyperlipidemia)    HTN (hypertension)    Pre-diabetes     ALLERGIES:  is allergic to gabapentin, morphine, other, prednisone, alprazolam, fentanyl, niacin, and penicillamine.  MEDICATIONS:  Current Outpatient Medications  Medication Sig Dispense Refill   albuterol (VENTOLIN HFA) 108 (90 Base) MCG/ACT inhaler Inhale 2 puffs into the lungs every 6 (six) hours as needed for wheezing or shortness of breath.     aspirin EC 81 MG tablet Take 81 mg by mouth daily.     atorvastatin (LIPITOR) 80 MG tablet Take 1 tablet (80 mg total) by mouth daily. 90 tablet 1   Cholecalciferol 50 MCG (2000 UT) TABS Take 2,000 Units by mouth daily.     clopidogrel (PLAVIX) 75 MG tablet Take 75 mg by mouth daily.     diclofenac sodium (VOLTAREN) 1 % GEL Apply 1 g topically daily as needed (pain).     diltiazem (TIAZAC) 240 MG 24 hr capsule Take 240 mg by mouth daily.     esomeprazole (NEXIUM) 40 MG capsule Take 40 mg by mouth daily.     guaiFENesin (ROBITUSSIN) 100 MG/5ML liquid Take 10 mLs by mouth every 4 (  four) hours as needed for cough or to loosen phlegm. 473 mL 5   LORazepam (ATIVAN) 1 MG tablet Take 1 mg by mouth 3 (three) times daily.     nitroGLYCERIN (NITROSTAT) 0.4 MG SL tablet Place 0.4 mg under the tongue every 5 (five) minutes as needed for chest pain.     oxyCODONE (OXY IR/ROXICODONE) 5 MG immediate release tablet Take 10 mg by mouth every 6 (six) hours as needed for severe pain (For 30 days). Verified with [facility].     prochlorperazine (COMPAZINE) 10 MG tablet Take 1 tablet (10 mg total) by mouth every 6 (six) hours as needed. 30  tablet 2   telmisartan (MICARDIS) 80 MG tablet Take 80 mg by mouth daily.     umeclidinium-vilanterol (ANORO ELLIPTA) 62.5-25 MCG/INH AEPB Inhale 1 puff into the lungs daily.     vitamin B-12 (CYANOCOBALAMIN) 500 MCG tablet Take 500 mcg by mouth daily.     No current facility-administered medications for this visit.    SURGICAL HISTORY:  Past Surgical History:  Procedure Laterality Date   APPENDECTOMY     CORONARY STENT INTERVENTION N/A 12/24/2021   Procedure: CORONARY STENT INTERVENTION;  Surgeon: Jettie Booze, MD;  Location: [facility];  Service: Cardiovascular;  Laterality: N/A;   HEMORROIDECTOMY     INTRAVASCULAR ULTRASOUND/IVUS N/A 12/24/2021   Procedure: Intravascular Ultrasound/IVUS;  Surgeon: Jettie Booze, MD;  Location: [facility];  Service: Cardiovascular;  Laterality: N/A;   LEFT HEART CATH AND CORONARY ANGIOGRAPHY N/A 12/23/2021   Procedure: LEFT HEART CATH AND CORONARY ANGIOGRAPHY;  Surgeon: Lorretta Harp, MD;  Location: [facility];  Service: Cardiovascular;  Laterality: N/A;   TONSILLECTOMY     VIDEO BRONCHOSCOPY WITH ENDOBRONCHIAL ULTRASOUND N/A 03/12/2022   Procedure: VIDEO BRONCHOSCOPY WITH ENDOBRONCHIAL ULTRASOUND;  Surgeon: Melrose Nakayama, MD;  Location: [facility];  Service: Thoracic;  Laterality: N/A;    REVIEW OF SYSTEMS:   Review of Systems  Constitutional: Negative for appetite change, chills, fatigue, fever and unexpected weight change.  HENT: Negative for mouth sores, nosebleeds, sore throat and trouble swallowing.   Eyes: Negative for eye problems and icterus.  Respiratory: Positive for mild cough. Positive for mild intermittent dyspnea. Negative for hemoptysis and wheezing.   Cardiovascular: Negative for chest pain and leg swelling.  Gastrointestinal: Positive for mild nausea. Positive for diarrhea (resolved at this time). Negative for abdominal pain, constipation, and vomiting.  Genitourinary: Negative for bladder  incontinence, difficulty urinating, dysuria, frequency and hematuria.   Musculoskeletal: Positive for chronic back pain. Negative for gait problem, neck pain and neck stiffness.  Skin: Negative for itching and rash.  Neurological: Negative for dizziness, extremity weakness, gait problem, headaches, light-headedness and seizures.  Hematological: Negative for adenopathy. Does not bruise/bleed easily.  Psychiatric/Behavioral: Negative for confusion, depression and sleep disturbance. The patient is not nervous/anxious.     PHYSICAL EXAMINATION:  Blood pressure 106/77, pulse 82, temperature (!) 97.5 F (36.4 C), temperature source Tympanic, resp. rate 18, height 5\' 11"  (1.803 m), weight 202 lb 4.8 oz (91.8 kg), SpO2 91 %.  ECOG PERFORMANCE STATUS: 1  Physical Exam  Constitutional: Oriented to person, place, and time and well-developed, well-nourished, and in no distress.  HENT:  Head: Normocephalic and atraumatic.  Mouth/Throat: Oropharynx is clear and moist. No oropharyngeal exudate.  Eyes: Conjunctivae are normal. Right eye exhibits no discharge. Left eye exhibits no discharge. No scleral icterus.  Neck: Normal range of motion. Neck supple.  Cardiovascular:  Normal rate, regular rhythm, normal heart sounds and intact distal pulses.   Pulmonary/Chest: Effort normal and breath sounds normal. No respiratory distress. No wheezes. No rales.  Abdominal: Soft. Bowel sounds are normal. Exhibits no distension and no mass. There is no tenderness.  Musculoskeletal: Normal range of motion. Exhibits no edema.  Lymphadenopathy:    No cervical adenopathy.  Neurological: Alert and oriented to person, place, and time. Exhibits normal muscle tone. Gait normal. Coordination normal.  Skin: Skin is warm and dry. No rash noted. Not diaphoretic. No erythema. No pallor.  Psychiatric: Mood, memory and judgment normal.  Vitals reviewed.  LABORATORY DATA: Lab Results  Component Value Date   WBC 5.5 04/14/2022    HGB 12.4 (L) 04/14/2022   HCT 36.2 (L) 04/14/2022   MCV 91.4 04/14/2022   PLT 236 04/14/2022      Chemistry      Component Value Date/Time   NA 138 04/14/2022 1442   NA 140 12/13/2021 0900   K 4.2 04/14/2022 1442   CL 105 04/14/2022 1442   CO2 26 04/14/2022 1442   BUN 18 04/14/2022 1442   BUN 9 12/13/2021 0900   CREATININE 1.07 04/14/2022 1442      Component Value Date/Time   CALCIUM 9.5 04/14/2022 1442   ALKPHOS 69 04/14/2022 1442   AST 22 04/14/2022 1442   ALT 30 04/14/2022 1442   BILITOT 0.5 04/14/2022 1442       RADIOGRAPHIC STUDIES:  MR Brain W Wo Contrast  Result Date: 04/02/2022 CLINICAL DATA:  Brain metastases suspected, lung cancer, r/o brain mets EXAM: MRI HEAD WITHOUT AND WITH CONTRAST TECHNIQUE: Multiplanar, multiecho pulse sequences of the brain and surrounding structures were obtained without and with intravenous contrast. CONTRAST:  69mL MULTIHANCE GADOBENATE DIMEGLUMINE 529 MG/ML IV SOLN COMPARISON:  None Available. FINDINGS: Brain: There is no acute infarction or intracranial hemorrhage. There is no intracranial mass, mass effect, or edema. There is no hydrocephalus or extra-axial fluid collection. Ventricles and sulci are within normal limits in size and configuration. Minimal patchy T2 hyperintensity in the supratentorial white matter is nonspecific but may reflect minor chronic microvascular ischemic changes. No abnormal enhancement. Vascular: Major vessel flow voids at the skull base are preserved. Skull and upper cervical spine: Normal marrow signal is preserved. Sinuses/Orbits: Mild paranasal sinus mucosal thickening. Right maxillary sinus retention cyst or polyp. Bilateral lens replacements. Other: Sella is unremarkable. Patchy bilateral mastoid fluid opacification. IMPRESSION: No evidence of intracranial metastatic disease. Electronically Signed   By: Macy Mis M.D.   On: 04/02/2022 09:37     ASSESSMENT/PLAN:  This is a very pleasant 71 year old  Caucasian male diagnosed with limited stage (T1c, N2, M0) small cell lung cancer.  His malignancy is a mixture of squamous cell carcinoma left upper lobe and a left upper lobe nodule.  He also has left hilar and mediastinal lymphadenopathy.  He was diagnosed in May 2023.  The patient is currently undergoing treatment with systemic chemotherapy with cisplatin 75 mg per metered squared on days 1 and etoposide 100 mg per metered squared on days 1, 2, and 3 IV every 3 weeks.  The patient also receives concurrent radiation.  His last day of radiation is tentatively scheduled for 05/22/2022.  The patient is status post 1 cycle and tolerated it well. He had diarrhea which resolved with imodium and nausea with responds to his anti-emetic.      Labs were reviewed.  Recommend that he continue on the same treatment at the same dose.  We will see him back for follow-up visit in 2 weeks for evaluation and repeat blood work before starting cycle #2.  He will continue to follow with his primary care provider regarding his pain management for his chronic back pain.  The patient was advised to call immediately if he has any concerning symptoms in the interval. The patient voices understanding of current disease status and treatment options and is in agreement with the current care plan. All questions were answered. The patient knows to call the clinic with any problems, questions or concerns. We can certainly see the patient much sooner if necessary   No orders of the defined types were placed in this encounter.    The total time spent in the appointment was 20-29 minutes.   Sedona Wenk L Jalayla Chrismer, PA-C 04/14/22

## 2022-04-08 ENCOUNTER — Inpatient Hospital Stay: Payer: Medicare Other

## 2022-04-08 ENCOUNTER — Other Ambulatory Visit: Payer: Self-pay

## 2022-04-08 ENCOUNTER — Ambulatory Visit
Admission: RE | Admit: 2022-04-08 | Discharge: 2022-04-08 | Disposition: A | Discharge status: Home / Self Care | Payer: Medicare Other | Source: Ambulatory Visit | Attending: Radiation Oncology | Admitting: Radiation Oncology

## 2022-04-08 ENCOUNTER — Other Ambulatory Visit: Payer: Medicare Other

## 2022-04-08 VITALS — BP 119/63 | HR 81 | Temp 97.8°F | Resp 17 | Wt 201.8 lb

## 2022-04-08 DIAGNOSIS — C3492 Malignant neoplasm of unspecified part of left bronchus or lung: Secondary | ICD-10-CM | POA: Diagnosis not present

## 2022-04-08 DIAGNOSIS — Z510 Encounter for antineoplastic radiation therapy: Secondary | ICD-10-CM | POA: Diagnosis not present

## 2022-04-08 DIAGNOSIS — F17210 Nicotine dependence, cigarettes, uncomplicated: Secondary | ICD-10-CM | POA: Diagnosis not present

## 2022-04-08 DIAGNOSIS — C3412 Malignant neoplasm of upper lobe, left bronchus or lung: Secondary | ICD-10-CM | POA: Diagnosis not present

## 2022-04-08 LAB — CMP (CANCER CENTER ONLY)
ALT: 20 U/L (ref 0–44)
AST: 17 U/L (ref 15–41)
Albumin: 4.1 g/dL (ref 3.5–5.0)
Alkaline Phosphatase: 71 U/L (ref 38–126)
Anion gap: 10 (ref 5–15)
BUN: 9 mg/dL (ref 8–23)
CO2: 25 mmol/L (ref 22–32)
Calcium: 9.1 mg/dL (ref 8.9–10.3)
Chloride: 105 mmol/L (ref 98–111)
Creatinine: 0.61 mg/dL (ref 0.61–1.24)
GFR, Estimated: >60 mL/min (ref >60)
Glucose, Bld: 95 mg/dL (ref 70–99)
Potassium: 3.6 mmol/L (ref 3.5–5.1)
Sodium: 140 mmol/L (ref 135–145)
Total Bilirubin: 0.7 mg/dL (ref 0.3–1.2)
Total Protein: 7.2 g/dL (ref 6.5–8.1)

## 2022-04-08 LAB — CBC WITH DIFFERENTIAL (CANCER CENTER ONLY)
Abs Immature Granulocytes: 0.02 10*3/uL (ref 0.00–0.07)
Basophils Absolute: 0.1 10*3/uL (ref 0.0–0.1)
Basophils Relative: 1 %
Eosinophils Absolute: 0.3 10*3/uL (ref 0.0–0.5)
Eosinophils Relative: 3 %
HCT: 41.8 % (ref 39.0–52.0)
Hemoglobin: 14.2 g/dL (ref 13.0–17.0)
Immature Granulocytes: 0 %
Lymphocytes Relative: 34 %
Lymphs Abs: 2.7 10*3/uL (ref 0.7–4.0)
MCH: 30.9 pg (ref 26.0–34.0)
MCHC: 34 g/dL (ref 30.0–36.0)
MCV: 90.9 fL (ref 80.0–100.0)
Monocytes Absolute: 0.9 10*3/uL (ref 0.1–1.0)
Monocytes Relative: 11 %
Neutro Abs: 4 10*3/uL (ref 1.7–7.7)
Neutrophils Relative %: 51 %
Platelet Count: 298 10*3/uL (ref 150–400)
RBC: 4.6 MIL/uL (ref 4.22–5.81)
RDW: 14.2 % (ref 11.5–15.5)
WBC Count: 7.8 10*3/uL (ref 4.0–10.5)
nRBC: 0 % (ref 0.0–0.2)

## 2022-04-08 LAB — RAD ONC ARIA SESSION SUMMARY
Course Elapsed Days: 1
Plan Fractions Treated to Date: 2
Plan Prescribed Dose Per Fraction: 2 Gy
Plan Total Fractions Prescribed: 33
Plan Total Prescribed Dose: 66 Gy
Reference Point Dosage Given to Date: 4 Gy
Reference Point Session Dosage Given: 2 Gy
Session Number: 2

## 2022-04-08 LAB — MAGNESIUM: Magnesium: 1.9 mg/dL (ref 1.7–2.4)

## 2022-04-08 MED ORDER — PALONOSETRON HCL INJECTION 0.25 MG/5ML
0.2500 mg | Freq: Once | INTRAVENOUS | Status: AC
  Administered 2022-04-08: 0.25 mg via INTRAVENOUS
  Filled 2022-04-08: qty 5

## 2022-04-08 MED ORDER — SODIUM CHLORIDE 0.9 % IV SOLN: Freq: Once | INTRAVENOUS | Status: AC

## 2022-04-08 MED ORDER — MAGNESIUM SULFATE 2 GM/50ML IV SOLN
2.0000 g | Freq: Once | INTRAVENOUS | Status: AC
  Administered 2022-04-08: 2 g via INTRAVENOUS
  Filled 2022-04-08: qty 50

## 2022-04-08 MED ORDER — POTASSIUM CHLORIDE IN NACL 20-0.9 MEQ/L-% IV SOLN
Freq: Once | INTRAVENOUS | Status: AC
  Filled 2022-04-08: qty 1000

## 2022-04-08 MED ORDER — SODIUM CHLORIDE 0.9 % IV SOLN
150.0000 mg | Freq: Once | INTRAVENOUS | Status: AC
  Administered 2022-04-08: 150 mg via INTRAVENOUS
  Filled 2022-04-08: qty 150

## 2022-04-08 MED ORDER — SODIUM CHLORIDE 0.9 % IV SOLN
100.0000 mg/m2 | Freq: Once | INTRAVENOUS | Status: AC
  Administered 2022-04-08: 220 mg via INTRAVENOUS
  Filled 2022-04-08: qty 11

## 2022-04-08 MED ORDER — SODIUM CHLORIDE 0.9 % IV SOLN
75.0000 mg/m2 | Freq: Once | INTRAVENOUS | Status: AC
  Administered 2022-04-08: 161 mg via INTRAVENOUS
  Filled 2022-04-08: qty 161

## 2022-04-08 NOTE — Patient Instructions (Signed)
[facility]COL[facility]  Discharge Instructions: Thank you for choosing [facility] to provide your oncology and hematology care.   If you have a lab appointment with the [facility], please go directly to the [facility] and check in at the registration area.   Wear comfortable clothing and clothing appropriate for easy access to any Portacath or PICC line.   We strive to give you quality time with your provider. You may need to reschedule your appointment if you arrive late (15 or more minutes).  Arriving late affects you and other patients whose appointments are after yours.  Also, if you miss three or more appointments without notifying the office, you may be dismissed from the clinic at the provider's discretion.      For prescription refill requests, have your pharmacy contact our office and allow 72 hours for refills to be completed.    Today you received the following chemotherapy and/or immunotherapy agents: Cisplatin and Etoposide      To help prevent nausea and vomiting after your treatment, we encourage you to take your nausea medication as directed.  BELOW ARE SYMPTOMS THAT SHOULD BE REPORTED IMMEDIATELY: *FEVER GREATER THAN 100.4 F (38 C) OR HIGHER *CHILLS OR SWEATING *NAUSEA AND VOMITING THAT IS NOT CONTROLLED WITH YOUR NAUSEA MEDICATION *UNUSUAL SHORTNESS OF BREATH *UNUSUAL BRUISING OR BLEEDING *URINARY PROBLEMS (pain or burning when urinating, or frequent urination) *BOWEL PROBLEMS (unusual diarrhea, constipation, pain near the anus) TENDERNESS IN MOUTH AND THROAT WITH OR WITHOUT PRESENCE OF ULCERS (sore throat, sores in mouth, or a toothache) UNUSUAL RASH, SWELLING OR PAIN  UNUSUAL VAGINAL DISCHARGE OR ITCHING   Items with * indicate a potential emergency and should be followed up as soon as possible or go to the Emergency Department if any problems should occur.  Please show the CHEMOTHERAPY ALERT CARD or IMMUNOTHERAPY ALERT CARD  at check-in to the Emergency Department and triage nurse.  Should you have questions after your visit or need to cancel or reschedule your appointment, please contact [facility]  Dept: [phone]  and follow the prompts.  Office hours are 8:00 a.m. to 4:30 p.m. Monday - Friday. Please note that voicemails left after 4:00 p.m. may not be returned until the following business day.  We are closed weekends and major holidays. You have access to a nurse at all times for urgent questions. Please call the main number to the clinic Dept: [phone] and follow the prompts.   For any non-urgent questions, you may also contact your provider using MyChart. We now offer e-Visits for anyone 67 and older to request care online for non-urgent symptoms. For details visit [email or web address].   Also download the MyChart app! Go to the app store, search "MyChart", open the app, select [facility], and log in with your MyChart username and password.  Due to Covid, a mask is required upon entering the hospital/clinic. If you do not have a mask, one will be given to you upon arrival. For doctor visits, patients may have 1 support person aged 70 or older with them. For treatment visits, patients cannot have anyone with them due to current Covid guidelines and our immunocompromised population.   Cisplatin injection What is this medication? CISPLATIN (SIS pla tin) is a chemotherapy drug. It targets fast dividing cells, like cancer cells, and causes these cells to die. This medicine is used to treat many types of cancer like bladder, ovarian, and testicular cancers. This medicine may  be used for other purposes; ask your health care provider or pharmacist if you have questions. COMMON BRAND NAME(S): Platinol, Platinol -AQ What should I tell my care team before I take this medication? They need to know if you have any of these conditions: eye disease, vision problems hearing  problems kidney disease low blood counts, like white cells, platelets, or red blood cells tingling of the fingers or toes, or other nerve disorder an unusual or allergic reaction to cisplatin, carboplatin, oxaliplatin, other medicines, foods, dyes, or preservatives pregnant or trying to get pregnant breast-feeding How should I use this medication? This drug is given as an infusion into a vein. It is administered in a hospital or clinic by a specially trained health care professional. Talk to your pediatrician regarding the use of this medicine in children. Special care may be needed. Overdosage: If you think you have taken too much of this medicine contact a poison control center or emergency room at once. NOTE: This medicine is only for you. Do not share this medicine with others. What if I miss a dose? It is important not to miss a dose. Call your doctor or health care professional if you are unable to keep an appointment. What may interact with this medication? This medicine may interact with the following medications: foscarnet certain antibiotics like amikacin, gentamicin, neomycin, polymyxin B, streptomycin, tobramycin, vancomycin This list may not describe all possible interactions. Give your health care provider a list of all the medicines, herbs, non-prescription drugs, or dietary supplements you use. Also tell them if you smoke, drink alcohol, or use illegal drugs. Some items may interact with your medicine. What should I watch for while using this medication? Your condition will be monitored carefully while you are receiving this medicine. You will need important blood work done while you are taking this medicine. This drug may make you feel generally unwell. This is not uncommon, as chemotherapy can affect healthy cells as well as cancer cells. Report any side effects. Continue your course of treatment even though you feel ill unless your doctor tells you to stop. This medicine may  increase your risk of getting an infection. Call your healthcare professional for advice if you get a fever, chills, or sore throat, or other symptoms of a cold or flu. Do not treat yourself. Try to avoid being around people who are sick. Avoid taking medicines that contain aspirin, acetaminophen, ibuprofen, naproxen, or ketoprofen unless instructed by your healthcare professional. These medicines may hide a fever. This medicine may increase your risk to bruise or bleed. Call your doctor or health care professional if you notice any unusual bleeding. Be careful brushing and flossing your teeth or using a toothpick because you may get an infection or bleed more easily. If you have any dental work done, tell your dentist you are receiving this medicine. Do not become pregnant while taking this medicine or for 14 months after stopping it. Women should inform their healthcare professional if they wish to become pregnant or think they might be pregnant. Men should not father a child while taking this medicine and for 11 months after stopping it. There is potential for serious side effects to an unborn child. Talk to your healthcare professional for more information. Do not breast-feed an infant while taking this medicine. This medicine has caused ovarian failure in some women. This medicine may make it more difficult to get pregnant. Talk to your healthcare professional if you are concerned about your fertility. This medicine  has caused decreased sperm counts in some men. This may make it more difficult to father a child. Talk to your healthcare professional if you are concerned about your fertility. Drink fluids as directed while you are taking this medicine. This will help protect your kidneys. Call your doctor or health care professional if you get diarrhea. Do not treat yourself. What side effects may I notice from receiving this medication? Side effects that you should report to your doctor or health care  professional as soon as possible: allergic reactions like skin rash, itching or hives, swelling of the face, lips, or tongue blurred vision changes in vision decreased hearing or ringing of the ears nausea, vomiting pain, redness, or irritation at site where injected pain, tingling, numbness in the hands or feet signs and symptoms of bleeding such as bloody or black, tarry stools; red or dark brown urine; spitting up blood or brown material that looks like coffee grounds; red spots on the skin; unusual bruising or bleeding from the eyes, gums, or nose signs and symptoms of infection like fever; chills; cough; sore throat; pain or trouble passing urine signs and symptoms of kidney injury like trouble passing urine or change in the amount of urine signs and symptoms of low red blood cells or anemia such as unusually weak or tired; feeling faint or lightheaded; falls; breathing problems Side effects that usually do not require medical attention (report to your doctor or health care professional if they continue or are bothersome): loss of appetite mouth sores muscle cramps This list may not describe all possible side effects. Call your doctor for medical advice about side effects. You may report side effects to FDA at 1-800-FDA-1088. Where should I keep my medication? This drug is given in a hospital or clinic and will not be stored at home. NOTE: This sheet is a summary. It may not cover all possible information. If you have questions about this medicine, talk to your doctor, pharmacist, or health care provider.  2023 Elsevier/Gold Standard (2021-09-20 00:00:00) Etoposide, VP-16 injection What is this medication? ETOPOSIDE, VP-16 (e toe POE side) is a chemotherapy drug. It is used to treat testicular cancer, lung cancer, and other cancers. This medicine may be used for other purposes; ask your health care provider or pharmacist if you have questions. COMMON BRAND NAME(S): Etopophos, Toposar,  VePesid What should I tell my care team before I take this medication? They need to know if you have any of these conditions: infection kidney disease liver disease low blood counts, like low white cell, platelet, or red cell counts an unusual or allergic reaction to etoposide, other medicines, foods, dyes, or preservatives pregnant or trying to get pregnant breast-feeding How should I use this medication? This medicine is for infusion into a vein. It is administered in a hospital or clinic by a specially trained health care professional. Talk to your pediatrician regarding the use of this medicine in children. Special care may be needed. Overdosage: If you think you have taken too much of this medicine contact a poison control center or emergency room at once. NOTE: This medicine is only for you. Do not share this medicine with others. What if I miss a dose? It is important not to miss your dose. Call your doctor or health care professional if you are unable to keep an appointment. What may interact with this medication? This medicine may interact with the following medications: warfarin This list may not describe all possible interactions. Give your health care  provider a list of all the medicines, herbs, non-prescription drugs, or dietary supplements you use. Also tell them if you smoke, drink alcohol, or use illegal drugs. Some items may interact with your medicine. What should I watch for while using this medication? Visit your doctor for checks on your progress. This drug may make you feel generally unwell. This is not uncommon, as chemotherapy can affect healthy cells as well as cancer cells. Report any side effects. Continue your course of treatment even though you feel ill unless your doctor tells you to stop. In some cases, you may be given additional medicines to help with side effects. Follow all directions for their use. Call your doctor or health care professional for advice if  you get a fever, chills or sore throat, or other symptoms of a cold or flu. Do not treat yourself. This drug decreases your body's ability to fight infections. Try to avoid being around people who are sick. This medicine may increase your risk to bruise or bleed. Call your doctor or health care professional if you notice any unusual bleeding. Talk to your doctor about your risk of cancer. You may be more at risk for certain types of cancers if you take this medicine. Do not become pregnant while taking this medicine or for at least 6 months after stopping it. Women should inform their doctor if they wish to become pregnant or think they might be pregnant. Women of child-bearing potential will need to have a negative pregnancy test before starting this medicine. There is a potential for serious side effects to an unborn child. Talk to your health care professional or pharmacist for more information. Do not breast-feed an infant while taking this medicine. Men must use a latex condom during sexual contact with a woman while taking this medicine and for at least 4 months after stopping it. A latex condom is needed even if you have had a vasectomy. Contact your doctor right away if your partner becomes pregnant. Do not donate sperm while taking this medicine and for at least 4 months after you stop taking this medicine. Men should inform their doctors if they wish to father a child. This medicine may lower sperm counts. What side effects may I notice from receiving this medication? Side effects that you should report to your doctor or health care professional as soon as possible: allergic reactions like skin rash, itching or hives, swelling of the face, lips, or tongue low blood counts - this medicine may decrease the number of white blood cells, red blood cells, and platelets. You may be at increased risk for infections and bleeding nausea, vomiting redness, blistering, peeling or loosening of the skin,  including inside the mouth signs and symptoms of infection like fever; chills; cough; sore throat; pain or trouble passing urine signs and symptoms of low red blood cells or anemia such as unusually weak or tired; feeling faint or lightheaded; falls; breathing problems unusual bruising or bleeding Side effects that usually do not require medical attention (report to your doctor or health care professional if they continue or are bothersome): changes in taste diarrhea hair loss loss of appetite mouth sores This list may not describe all possible side effects. Call your doctor for medical advice about side effects. You may report side effects to FDA at 1-800-FDA-1088. Where should I keep my medication? This drug is given in a hospital or clinic and will not be stored at home. NOTE: This sheet is a summary. It may not  cover all possible information. If you have questions about this medicine, talk to your doctor, pharmacist, or health care provider.  2023 Elsevier/Gold Standard (2021-09-20 00:00:00)

## 2022-04-09 ENCOUNTER — Other Ambulatory Visit: Payer: Self-pay

## 2022-04-09 ENCOUNTER — Ambulatory Visit
Admission: RE | Admit: 2022-04-09 | Discharge: 2022-04-09 | Disposition: A | Discharge status: Home / Self Care | Payer: Medicare Other | Source: Ambulatory Visit | Attending: Radiation Oncology | Admitting: Radiation Oncology

## 2022-04-09 ENCOUNTER — Inpatient Hospital Stay: Payer: Medicare Other

## 2022-04-09 ENCOUNTER — Encounter: Payer: Self-pay | Admitting: *Deleted

## 2022-04-09 VITALS — BP 111/68 | HR 81 | Temp 97.8°F | Resp 20

## 2022-04-09 DIAGNOSIS — C3492 Malignant neoplasm of unspecified part of left bronchus or lung: Secondary | ICD-10-CM | POA: Diagnosis not present

## 2022-04-09 DIAGNOSIS — C3412 Malignant neoplasm of upper lobe, left bronchus or lung: Secondary | ICD-10-CM | POA: Diagnosis not present

## 2022-04-09 DIAGNOSIS — Z510 Encounter for antineoplastic radiation therapy: Secondary | ICD-10-CM | POA: Diagnosis not present

## 2022-04-09 DIAGNOSIS — F17210 Nicotine dependence, cigarettes, uncomplicated: Secondary | ICD-10-CM | POA: Diagnosis not present

## 2022-04-09 LAB — RAD ONC ARIA SESSION SUMMARY
Course Elapsed Days: 2
Plan Fractions Treated to Date: 3
Plan Prescribed Dose Per Fraction: 2 Gy
Plan Total Fractions Prescribed: 33
Plan Total Prescribed Dose: 66 Gy
Reference Point Dosage Given to Date: 6 Gy
Reference Point Session Dosage Given: 2 Gy
Session Number: 3

## 2022-04-09 MED ORDER — SODIUM CHLORIDE 0.9 % IV SOLN: Freq: Once | INTRAVENOUS | Status: AC

## 2022-04-09 MED ORDER — SODIUM CHLORIDE 0.9 % IV SOLN
100.0000 mg/m2 | Freq: Once | INTRAVENOUS | Status: AC
  Administered 2022-04-09: 220 mg via INTRAVENOUS
  Filled 2022-04-09: qty 11

## 2022-04-09 NOTE — Progress Notes (Signed)
Oncology Nurse Navigator Documentation     04/09/2022    1:00 PM  Oncology Nurse Navigator Flowsheets  Abnormal Finding Date 12/16/2021  Confirmed Diagnosis Date 03/12/2022  Diagnosis Status Confirmed Diagnosis Complete  Planned Course of Treatment Chemo/Radiation Concurrent  Phase of Treatment Radiation  Chemotherapy Actual Start Date: 04/07/2022  Radiation Actual Start Date: 04/07/2022  Navigator Follow Up Date: 04/14/2022  Navigator Follow Up Reason: Follow-up Appointment  Navigator Location [facility]  Navigator Encounter Type Appt/Treatment Plan Review  Treatment Initiated Date 04/07/2022  Patient Visit Type Other  Treatment Phase Treatment  Barriers/Navigation Needs Coordination of Care/I followed up on Francisco Austin treatment plan and schedule. He started treatment this week and has follow up appts at this time.   Interventions Coordination of Care  Acuity Level 2-Minimal Needs (1-2 Barriers Identified)  Coordination of Care Other  Time Spent with Patient 30

## 2022-04-10 ENCOUNTER — Inpatient Hospital Stay: Payer: Medicare Other

## 2022-04-10 ENCOUNTER — Ambulatory Visit
Admission: RE | Admit: 2022-04-10 | Discharge: 2022-04-10 | Disposition: A | Discharge status: Home / Self Care | Payer: Medicare Other | Source: Ambulatory Visit | Attending: Radiation Oncology | Admitting: Radiation Oncology

## 2022-04-10 ENCOUNTER — Telehealth: Payer: Self-pay

## 2022-04-10 ENCOUNTER — Other Ambulatory Visit: Payer: Self-pay

## 2022-04-10 VITALS — BP 122/76 | HR 84 | Temp 98.4°F | Resp 16

## 2022-04-10 DIAGNOSIS — C3492 Malignant neoplasm of unspecified part of left bronchus or lung: Secondary | ICD-10-CM

## 2022-04-10 DIAGNOSIS — Z510 Encounter for antineoplastic radiation therapy: Secondary | ICD-10-CM | POA: Diagnosis not present

## 2022-04-10 DIAGNOSIS — F17210 Nicotine dependence, cigarettes, uncomplicated: Secondary | ICD-10-CM | POA: Diagnosis not present

## 2022-04-10 DIAGNOSIS — C3412 Malignant neoplasm of upper lobe, left bronchus or lung: Secondary | ICD-10-CM | POA: Diagnosis not present

## 2022-04-10 LAB — RAD ONC ARIA SESSION SUMMARY
Course Elapsed Days: 3
Plan Fractions Treated to Date: 4
Plan Prescribed Dose Per Fraction: 2 Gy
Plan Total Fractions Prescribed: 33
Plan Total Prescribed Dose: 66 Gy
Reference Point Dosage Given to Date: 8 Gy
Reference Point Session Dosage Given: 2 Gy
Session Number: 4

## 2022-04-10 MED ORDER — SODIUM CHLORIDE 0.9 % IV SOLN: Freq: Once | INTRAVENOUS | Status: AC

## 2022-04-10 MED ORDER — SODIUM CHLORIDE 0.9 % IV SOLN
100.0000 mg/m2 | Freq: Once | INTRAVENOUS | Status: AC
  Administered 2022-04-10: 220 mg via INTRAVENOUS
  Filled 2022-04-10: qty 11

## 2022-04-10 NOTE — Patient Instructions (Addendum)
[facility]COL[facility]   Discharge Instructions: Thank you for choosing [facility] to provide your oncology and hematology care.   If you have a lab appointment with the [facility], please go directly to the [facility] and check in at the registration area.   Wear comfortable clothing and clothing appropriate for easy access to any Portacath or PICC line.   We strive to give you quality time with your provider. You may need to reschedule your appointment if you arrive late (15 or more minutes).  Arriving late affects you and other patients whose appointments are after yours.  Also, if you miss three or more appointments without notifying the office, you may be dismissed from the clinic at the provider's discretion.      For prescription refill requests, have your pharmacy contact our office and allow 72 hours for refills to be completed.    Today you received the following chemotherapy and/or immunotherapy agents: etoposide      To help prevent nausea and vomiting after your treatment, we encourage you to take your nausea medication as directed.  BELOW ARE SYMPTOMS THAT SHOULD BE REPORTED IMMEDIATELY: *FEVER GREATER THAN 100.4 F (38 C) OR HIGHER *CHILLS OR SWEATING *NAUSEA AND VOMITING THAT IS NOT CONTROLLED WITH YOUR NAUSEA MEDICATION *UNUSUAL SHORTNESS OF BREATH *UNUSUAL BRUISING OR BLEEDING *URINARY PROBLEMS (pain or burning when urinating, or frequent urination) *BOWEL PROBLEMS (unusual diarrhea, constipation, pain near the anus) TENDERNESS IN MOUTH AND THROAT WITH OR WITHOUT PRESENCE OF ULCERS (sore throat, sores in mouth, or a toothache) UNUSUAL RASH, SWELLING OR PAIN  UNUSUAL VAGINAL DISCHARGE OR ITCHING   Items with * indicate a potential emergency and should be followed up as soon as possible or go to the Emergency Department if any problems should occur.  Please show the CHEMOTHERAPY ALERT CARD or IMMUNOTHERAPY ALERT CARD at check-in  to the Emergency Department and triage nurse.  Should you have questions after your visit or need to cancel or reschedule your appointment, please contact [facility]  Dept: [phone]  and follow the prompts.  Office hours are 8:00 a.m. to 4:30 p.m. Monday - Friday. Please note that voicemails left after 4:00 p.m. may not be returned until the following business day.  We are closed weekends and major holidays. You have access to a nurse at all times for urgent questions. Please call the main number to the clinic Dept: [phone] and follow the prompts.   For any non-urgent questions, you may also contact your provider using MyChart. We now offer e-Visits for anyone 7 and older to request care online for non-urgent symptoms. For details visit [email or web address].   Also download the MyChart app! Go to the app store, search "MyChart", open the app, select [facility], and log in with your MyChart username and password.  Due to Covid, a mask is required upon entering the hospital/clinic. If you do not have a mask, one will be given to you upon arrival. For doctor visits, patients may have 1 support person aged 34 or older with them. For treatment visits, patients cannot have anyone with them due to current Covid guidelines and our immunocompromised population.  Etoposide, VP-16 injection What is this medication? ETOPOSIDE, VP-16 (e toe POE side) is a chemotherapy drug. It is used to treat testicular cancer, lung cancer, and other cancers. This medicine may be used for other purposes; ask your health care provider or pharmacist if you have questions. COMMON  BRAND NAME(S): Etopophos, Toposar, VePesid What should I tell my care team before I take this medication? They need to know if you have any of these conditions: infection kidney disease liver disease low blood counts, like low white cell, platelet, or red cell counts an unusual or allergic reaction to  etoposide, other medicines, foods, dyes, or preservatives pregnant or trying to get pregnant breast-feeding How should I use this medication? This medicine is for infusion into a vein. It is administered in a hospital or clinic by a specially trained health care professional. Talk to your pediatrician regarding the use of this medicine in children. Special care may be needed. Overdosage: If you think you have taken too much of this medicine contact a poison control center or emergency room at once. NOTE: This medicine is only for you. Do not share this medicine with others. What if I miss a dose? It is important not to miss your dose. Call your doctor or health care professional if you are unable to keep an appointment. What may interact with this medication? This medicine may interact with the following medications: warfarin This list may not describe all possible interactions. Give your health care provider a list of all the medicines, herbs, non-prescription drugs, or dietary supplements you use. Also tell them if you smoke, drink alcohol, or use illegal drugs. Some items may interact with your medicine. What should I watch for while using this medication? Visit your doctor for checks on your progress. This drug may make you feel generally unwell. This is not uncommon, as chemotherapy can affect healthy cells as well as cancer cells. Report any side effects. Continue your course of treatment even though you feel ill unless your doctor tells you to stop. In some cases, you may be given additional medicines to help with side effects. Follow all directions for their use. Call your doctor or health care professional for advice if you get a fever, chills or sore throat, or other symptoms of a cold or flu. Do not treat yourself. This drug decreases your body's ability to fight infections. Try to avoid being around people who are sick. This medicine may increase your risk to bruise or bleed. Call your  doctor or health care professional if you notice any unusual bleeding. Talk to your doctor about your risk of cancer. You may be more at risk for certain types of cancers if you take this medicine. Do not become pregnant while taking this medicine or for at least 6 months after stopping it. Women should inform their doctor if they wish to become pregnant or think they might be pregnant. Women of child-bearing potential will need to have a negative pregnancy test before starting this medicine. There is a potential for serious side effects to an unborn child. Talk to your health care professional or pharmacist for more information. Do not breast-feed an infant while taking this medicine. Men must use a latex condom during sexual contact with a woman while taking this medicine and for at least 4 months after stopping it. A latex condom is needed even if you have had a vasectomy. Contact your doctor right away if your partner becomes pregnant. Do not donate sperm while taking this medicine and for at least 4 months after you stop taking this medicine. Men should inform their doctors if they wish to father a child. This medicine may lower sperm counts. What side effects may I notice from receiving this medication? Side effects that you should report to  your doctor or health care professional as soon as possible: allergic reactions like skin rash, itching or hives, swelling of the face, lips, or tongue low blood counts - this medicine may decrease the number of white blood cells, red blood cells, and platelets. You may be at increased risk for infections and bleeding nausea, vomiting redness, blistering, peeling or loosening of the skin, including inside the mouth signs and symptoms of infection like fever; chills; cough; sore throat; pain or trouble passing urine signs and symptoms of low red blood cells or anemia such as unusually weak or tired; feeling faint or lightheaded; falls; breathing problems unusual  bruising or bleeding Side effects that usually do not require medical attention (report to your doctor or health care professional if they continue or are bothersome): changes in taste diarrhea hair loss loss of appetite mouth sores This list may not describe all possible side effects. Call your doctor for medical advice about side effects. You may report side effects to FDA at 1-800-FDA-1088. Where should I keep my medication? This drug is given in a hospital or clinic and will not be stored at home. NOTE: This sheet is a summary. It may not cover all possible information. If you have questions about this medicine, talk to your doctor, pharmacist, or health care provider.  2023 Elsevier/Gold Standard (2021-09-20 00:00:00)

## 2022-04-10 NOTE — Telephone Encounter (Signed)
Call received from pt indiating he has diarrhea about 4 times a day. Pt denies fever. Pt has been advised he can take Imodium but call us back if no improvement.

## 2022-04-11 ENCOUNTER — Other Ambulatory Visit: Payer: Self-pay | Admitting: Radiation Oncology

## 2022-04-11 ENCOUNTER — Ambulatory Visit
Admission: RE | Admit: 2022-04-11 | Discharge: 2022-04-11 | Disposition: A | Discharge status: Home / Self Care | Payer: Medicare Other | Source: Ambulatory Visit | Attending: Radiation Oncology | Admitting: Radiation Oncology

## 2022-04-11 ENCOUNTER — Other Ambulatory Visit: Payer: Self-pay

## 2022-04-11 ENCOUNTER — Telehealth: Payer: Self-pay

## 2022-04-11 DIAGNOSIS — C3492 Malignant neoplasm of unspecified part of left bronchus or lung: Secondary | ICD-10-CM | POA: Diagnosis not present

## 2022-04-11 DIAGNOSIS — F17210 Nicotine dependence, cigarettes, uncomplicated: Secondary | ICD-10-CM | POA: Diagnosis not present

## 2022-04-11 DIAGNOSIS — Z510 Encounter for antineoplastic radiation therapy: Secondary | ICD-10-CM | POA: Diagnosis not present

## 2022-04-11 DIAGNOSIS — C3412 Malignant neoplasm of upper lobe, left bronchus or lung: Secondary | ICD-10-CM | POA: Diagnosis not present

## 2022-04-11 LAB — RAD ONC ARIA SESSION SUMMARY
Course Elapsed Days: 4
Plan Fractions Treated to Date: 5
Plan Prescribed Dose Per Fraction: 2 Gy
Plan Total Fractions Prescribed: 33
Plan Total Prescribed Dose: 66 Gy
Reference Point Dosage Given to Date: 10 Gy
Reference Point Session Dosage Given: 2 Gy
Session Number: 5

## 2022-04-11 MED ORDER — GUAIFENESIN 100 MG/5ML PO LIQD: 10.0000 mL | ORAL | 5 refills | Status: DC | PRN

## 2022-04-11 NOTE — Telephone Encounter (Signed)
Mr. Mustard states that he is experiencing some nausea,no vomiting.  The compazine is effective for nausea. He is drinking and urinating well. He is eating in smaller portions. The imodium was effective in stopping the diarrhea that he call about yesterday. He knows to call the office at [phone] if he has any questions or concerns.

## 2022-04-11 NOTE — Telephone Encounter (Signed)
-----   Message from Charleston Poot, RN sent at 04/08/2022  3:42 PM EDT ----- Regarding: First Time/ Cisplatin and Etoposide/ Dr Julien Nordmann pt Pt had first time cisplatin and etoposide today. Tolerated treatment well.  Thanks, Rhea Pink

## 2022-04-14 ENCOUNTER — Inpatient Hospital Stay (HOSPITAL_BASED_OUTPATIENT_CLINIC_OR_DEPARTMENT_OTHER): Payer: Medicare Other | Admitting: Physician Assistant

## 2022-04-14 ENCOUNTER — Other Ambulatory Visit: Payer: Self-pay

## 2022-04-14 ENCOUNTER — Ambulatory Visit
Admission: RE | Admit: 2022-04-14 | Discharge: 2022-04-14 | Disposition: A | Discharge status: Home / Self Care | Payer: Medicare Other | Source: Ambulatory Visit | Attending: Radiation Oncology | Admitting: Radiation Oncology

## 2022-04-14 ENCOUNTER — Inpatient Hospital Stay: Payer: Medicare Other

## 2022-04-14 ENCOUNTER — Other Ambulatory Visit: Payer: Medicare Other

## 2022-04-14 VITALS — BP 106/77 | HR 82 | Temp 97.5°F | Resp 18 | Ht 71.0 in | Wt 202.3 lb

## 2022-04-14 DIAGNOSIS — E1169 Type 2 diabetes mellitus with other specified complication: Secondary | ICD-10-CM | POA: Diagnosis not present

## 2022-04-14 DIAGNOSIS — C3412 Malignant neoplasm of upper lobe, left bronchus or lung: Secondary | ICD-10-CM | POA: Diagnosis not present

## 2022-04-14 DIAGNOSIS — C3492 Malignant neoplasm of unspecified part of left bronchus or lung: Secondary | ICD-10-CM | POA: Diagnosis not present

## 2022-04-14 DIAGNOSIS — Z510 Encounter for antineoplastic radiation therapy: Secondary | ICD-10-CM | POA: Diagnosis not present

## 2022-04-14 DIAGNOSIS — F17210 Nicotine dependence, cigarettes, uncomplicated: Secondary | ICD-10-CM | POA: Diagnosis not present

## 2022-04-14 DIAGNOSIS — E7800 Pure hypercholesterolemia, unspecified: Secondary | ICD-10-CM | POA: Diagnosis not present

## 2022-04-14 DIAGNOSIS — I10 Essential (primary) hypertension: Secondary | ICD-10-CM | POA: Diagnosis not present

## 2022-04-14 LAB — RAD ONC ARIA SESSION SUMMARY
Course Elapsed Days: 7
Plan Fractions Treated to Date: 6
Plan Prescribed Dose Per Fraction: 2 Gy
Plan Total Fractions Prescribed: 33
Plan Total Prescribed Dose: 66 Gy
Reference Point Dosage Given to Date: 12 Gy
Reference Point Session Dosage Given: 2 Gy
Session Number: 6

## 2022-04-14 LAB — CMP (CANCER CENTER ONLY)
ALT: 30 U/L (ref 0–44)
AST: 22 U/L (ref 15–41)
Albumin: 4.1 g/dL (ref 3.5–5.0)
Alkaline Phosphatase: 69 U/L (ref 38–126)
Anion gap: 7 (ref 5–15)
BUN: 18 mg/dL (ref 8–23)
CO2: 26 mmol/L (ref 22–32)
Calcium: 9.5 mg/dL (ref 8.9–10.3)
Chloride: 105 mmol/L (ref 98–111)
Creatinine: 1.07 mg/dL (ref 0.61–1.24)
GFR, Estimated: >60 mL/min (ref >60)
Glucose, Bld: 126 mg/dL — ABNORMAL HIGH (ref 70–99)
Potassium: 4.2 mmol/L (ref 3.5–5.1)
Sodium: 138 mmol/L (ref 135–145)
Total Bilirubin: 0.5 mg/dL (ref 0.3–1.2)
Total Protein: 6.9 g/dL (ref 6.5–8.1)

## 2022-04-14 LAB — CBC WITH DIFFERENTIAL (CANCER CENTER ONLY)
Abs Immature Granulocytes: 0.04 10*3/uL (ref 0.00–0.07)
Basophils Absolute: 0 10*3/uL (ref 0.0–0.1)
Basophils Relative: 1 %
Eosinophils Absolute: 0 10*3/uL (ref 0.0–0.5)
Eosinophils Relative: 0 %
HCT: 36.2 % — ABNORMAL LOW (ref 39.0–52.0)
Hemoglobin: 12.4 g/dL — ABNORMAL LOW (ref 13.0–17.0)
Immature Granulocytes: 1 %
Lymphocytes Relative: 22 %
Lymphs Abs: 1.2 10*3/uL (ref 0.7–4.0)
MCH: 31.3 pg (ref 26.0–34.0)
MCHC: 34.3 g/dL (ref 30.0–36.0)
MCV: 91.4 fL (ref 80.0–100.0)
Monocytes Absolute: 0.1 10*3/uL (ref 0.1–1.0)
Monocytes Relative: 1 %
Neutro Abs: 4.2 10*3/uL (ref 1.7–7.7)
Neutrophils Relative %: 75 %
Platelet Count: 236 10*3/uL (ref 150–400)
RBC: 3.96 MIL/uL — ABNORMAL LOW (ref 4.22–5.81)
RDW: 13.7 % (ref 11.5–15.5)
Smear Review: NORMAL
WBC Count: 5.5 10*3/uL (ref 4.0–10.5)
nRBC: 0 % (ref 0.0–0.2)

## 2022-04-14 LAB — MAGNESIUM: Magnesium: 1.8 mg/dL (ref 1.7–2.4)

## 2022-04-15 ENCOUNTER — Ambulatory Visit
Admission: RE | Admit: 2022-04-15 | Discharge: 2022-04-15 | Disposition: A | Discharge status: Home / Self Care | Payer: Medicare Other | Source: Ambulatory Visit | Attending: Radiation Oncology | Admitting: Radiation Oncology

## 2022-04-15 ENCOUNTER — Other Ambulatory Visit: Payer: Self-pay

## 2022-04-15 DIAGNOSIS — C3412 Malignant neoplasm of upper lobe, left bronchus or lung: Secondary | ICD-10-CM | POA: Diagnosis not present

## 2022-04-15 DIAGNOSIS — Z510 Encounter for antineoplastic radiation therapy: Secondary | ICD-10-CM | POA: Diagnosis not present

## 2022-04-15 DIAGNOSIS — C3492 Malignant neoplasm of unspecified part of left bronchus or lung: Secondary | ICD-10-CM | POA: Diagnosis not present

## 2022-04-15 DIAGNOSIS — F17210 Nicotine dependence, cigarettes, uncomplicated: Secondary | ICD-10-CM | POA: Diagnosis not present

## 2022-04-15 LAB — RAD ONC ARIA SESSION SUMMARY
Course Elapsed Days: 8
Plan Fractions Treated to Date: 7
Plan Prescribed Dose Per Fraction: 2 Gy
Plan Total Fractions Prescribed: 33
Plan Total Prescribed Dose: 66 Gy
Reference Point Dosage Given to Date: 14 Gy
Reference Point Session Dosage Given: 2 Gy
Session Number: 7

## 2022-04-16 ENCOUNTER — Ambulatory Visit
Admission: RE | Admit: 2022-04-16 | Discharge: 2022-04-16 | Disposition: A | Discharge status: Home / Self Care | Payer: Medicare Other | Source: Ambulatory Visit | Attending: Radiation Oncology | Admitting: Radiation Oncology

## 2022-04-16 ENCOUNTER — Other Ambulatory Visit: Payer: Self-pay

## 2022-04-16 DIAGNOSIS — Z510 Encounter for antineoplastic radiation therapy: Secondary | ICD-10-CM | POA: Diagnosis not present

## 2022-04-16 DIAGNOSIS — C3492 Malignant neoplasm of unspecified part of left bronchus or lung: Secondary | ICD-10-CM | POA: Diagnosis not present

## 2022-04-16 DIAGNOSIS — C3412 Malignant neoplasm of upper lobe, left bronchus or lung: Secondary | ICD-10-CM | POA: Diagnosis not present

## 2022-04-16 DIAGNOSIS — F17210 Nicotine dependence, cigarettes, uncomplicated: Secondary | ICD-10-CM | POA: Diagnosis not present

## 2022-04-16 LAB — RAD ONC ARIA SESSION SUMMARY
Course Elapsed Days: 9
Plan Fractions Treated to Date: 8
Plan Prescribed Dose Per Fraction: 2 Gy
Plan Total Fractions Prescribed: 33
Plan Total Prescribed Dose: 66 Gy
Reference Point Dosage Given to Date: 16 Gy
Reference Point Session Dosage Given: 2 Gy
Session Number: 8

## 2022-04-17 ENCOUNTER — Ambulatory Visit
Admission: RE | Admit: 2022-04-17 | Discharge: 2022-04-17 | Disposition: A | Discharge status: Home / Self Care | Payer: Medicare Other | Source: Ambulatory Visit | Attending: Radiation Oncology | Admitting: Radiation Oncology

## 2022-04-17 ENCOUNTER — Other Ambulatory Visit: Payer: Self-pay

## 2022-04-17 DIAGNOSIS — Z510 Encounter for antineoplastic radiation therapy: Secondary | ICD-10-CM | POA: Diagnosis not present

## 2022-04-17 DIAGNOSIS — F17210 Nicotine dependence, cigarettes, uncomplicated: Secondary | ICD-10-CM | POA: Diagnosis not present

## 2022-04-17 DIAGNOSIS — C3412 Malignant neoplasm of upper lobe, left bronchus or lung: Secondary | ICD-10-CM | POA: Diagnosis not present

## 2022-04-17 DIAGNOSIS — C3492 Malignant neoplasm of unspecified part of left bronchus or lung: Secondary | ICD-10-CM | POA: Diagnosis not present

## 2022-04-17 LAB — RAD ONC ARIA SESSION SUMMARY
Course Elapsed Days: 10
Plan Fractions Treated to Date: 9
Plan Prescribed Dose Per Fraction: 2 Gy
Plan Total Fractions Prescribed: 33
Plan Total Prescribed Dose: 66 Gy
Reference Point Dosage Given to Date: 18 Gy
Reference Point Session Dosage Given: 2 Gy
Session Number: 9

## 2022-04-18 ENCOUNTER — Other Ambulatory Visit: Payer: Self-pay

## 2022-04-18 ENCOUNTER — Ambulatory Visit
Admission: RE | Admit: 2022-04-18 | Discharge: 2022-04-18 | Disposition: A | Discharge status: Home / Self Care | Payer: Medicare Other | Source: Ambulatory Visit | Attending: Radiation Oncology | Admitting: Radiation Oncology

## 2022-04-18 DIAGNOSIS — F17210 Nicotine dependence, cigarettes, uncomplicated: Secondary | ICD-10-CM | POA: Diagnosis not present

## 2022-04-18 DIAGNOSIS — Z510 Encounter for antineoplastic radiation therapy: Secondary | ICD-10-CM | POA: Diagnosis not present

## 2022-04-18 DIAGNOSIS — C3412 Malignant neoplasm of upper lobe, left bronchus or lung: Secondary | ICD-10-CM | POA: Diagnosis not present

## 2022-04-18 DIAGNOSIS — C3492 Malignant neoplasm of unspecified part of left bronchus or lung: Secondary | ICD-10-CM | POA: Diagnosis not present

## 2022-04-18 LAB — RAD ONC ARIA SESSION SUMMARY
Course Elapsed Days: 11
Plan Fractions Treated to Date: 10
Plan Prescribed Dose Per Fraction: 2 Gy
Plan Total Fractions Prescribed: 33
Plan Total Prescribed Dose: 66 Gy
Reference Point Dosage Given to Date: 20 Gy
Reference Point Session Dosage Given: 2 Gy
Session Number: 10

## 2022-04-21 ENCOUNTER — Other Ambulatory Visit: Payer: Self-pay | Admitting: Radiation Oncology

## 2022-04-21 ENCOUNTER — Other Ambulatory Visit: Payer: Self-pay | Admitting: Lab

## 2022-04-21 ENCOUNTER — Inpatient Hospital Stay: Payer: Medicare Other

## 2022-04-21 ENCOUNTER — Other Ambulatory Visit: Payer: Self-pay

## 2022-04-21 ENCOUNTER — Ambulatory Visit
Admission: RE | Admit: 2022-04-21 | Discharge: 2022-04-21 | Disposition: A | Discharge status: Home / Self Care | Payer: Medicare Other | Source: Ambulatory Visit | Attending: Radiation Oncology | Admitting: Radiation Oncology

## 2022-04-21 ENCOUNTER — Telehealth: Payer: Self-pay

## 2022-04-21 DIAGNOSIS — F17210 Nicotine dependence, cigarettes, uncomplicated: Secondary | ICD-10-CM | POA: Diagnosis not present

## 2022-04-21 DIAGNOSIS — C3492 Malignant neoplasm of unspecified part of left bronchus or lung: Secondary | ICD-10-CM

## 2022-04-21 DIAGNOSIS — C3412 Malignant neoplasm of upper lobe, left bronchus or lung: Secondary | ICD-10-CM | POA: Diagnosis not present

## 2022-04-21 DIAGNOSIS — Z510 Encounter for antineoplastic radiation therapy: Secondary | ICD-10-CM | POA: Diagnosis not present

## 2022-04-21 LAB — CBC WITH DIFFERENTIAL (CANCER CENTER ONLY)
Abs Immature Granulocytes: 0 10*3/uL (ref 0.00–0.07)
Basophils Absolute: 0 10*3/uL (ref 0.0–0.1)
Basophils Relative: 1 %
Eosinophils Absolute: 0 10*3/uL (ref 0.0–0.5)
Eosinophils Relative: 1 %
HCT: 35 % — ABNORMAL LOW (ref 39.0–52.0)
Hemoglobin: 12.1 g/dL — ABNORMAL LOW (ref 13.0–17.0)
Immature Granulocytes: 0 %
Lymphocytes Relative: 46 %
Lymphs Abs: 0.9 10*3/uL (ref 0.7–4.0)
MCH: 31.2 pg (ref 26.0–34.0)
MCHC: 34.6 g/dL (ref 30.0–36.0)
MCV: 90.2 fL (ref 80.0–100.0)
Monocytes Absolute: 0.1 10*3/uL (ref 0.1–1.0)
Monocytes Relative: 6 %
Neutro Abs: 0.9 10*3/uL — ABNORMAL LOW (ref 1.7–7.7)
Neutrophils Relative %: 46 %
Platelet Count: 87 10*3/uL — ABNORMAL LOW (ref 150–400)
RBC: 3.88 MIL/uL — ABNORMAL LOW (ref 4.22–5.81)
RDW: 13.2 % (ref 11.5–15.5)
Smear Review: NORMAL
WBC Count: 2 10*3/uL — ABNORMAL LOW (ref 4.0–10.5)
nRBC: 0 % (ref 0.0–0.2)

## 2022-04-21 LAB — RAD ONC ARIA SESSION SUMMARY
Course Elapsed Days: 14
Plan Fractions Treated to Date: 11
Plan Prescribed Dose Per Fraction: 2 Gy
Plan Total Fractions Prescribed: 33
Plan Total Prescribed Dose: 66 Gy
Reference Point Dosage Given to Date: 22 Gy
Reference Point Session Dosage Given: 2 Gy
Session Number: 11

## 2022-04-21 LAB — CMP (CANCER CENTER ONLY)
ALT: 49 U/L — ABNORMAL HIGH (ref 0–44)
AST: 24 U/L (ref 15–41)
Albumin: 4.1 g/dL (ref 3.5–5.0)
Alkaline Phosphatase: 88 U/L (ref 38–126)
Anion gap: 9 (ref 5–15)
BUN: 14 mg/dL (ref 8–23)
CO2: 24 mmol/L (ref 22–32)
Calcium: 9.3 mg/dL (ref 8.9–10.3)
Chloride: 105 mmol/L (ref 98–111)
Creatinine: 1.02 mg/dL (ref 0.61–1.24)
GFR, Estimated: >60 mL/min (ref >60)
Glucose, Bld: 112 mg/dL — ABNORMAL HIGH (ref 70–99)
Potassium: 4.2 mmol/L (ref 3.5–5.1)
Sodium: 138 mmol/L (ref 135–145)
Total Bilirubin: 0.3 mg/dL (ref 0.3–1.2)
Total Protein: 7.2 g/dL (ref 6.5–8.1)

## 2022-04-21 LAB — MAGNESIUM: Magnesium: 1.7 mg/dL (ref 1.7–2.4)

## 2022-04-21 MED ORDER — SUCRALFATE 1 G PO TABS: 1.0000 g | ORAL_TABLET | Freq: Three times a day (TID) | ORAL | 2 refills | Status: DC

## 2022-04-21 NOTE — Telephone Encounter (Signed)
RN returned call to patient with prescription for medication per Dr. Tammi Klippel for radiation induced esophagitis.  Patient expressed gratitude for quick response to his needs.

## 2022-04-22 ENCOUNTER — Ambulatory Visit
Admission: RE | Admit: 2022-04-22 | Discharge: 2022-04-22 | Disposition: A | Discharge status: Home / Self Care | Payer: Medicare Other | Source: Ambulatory Visit | Attending: Radiation Oncology | Admitting: Radiation Oncology

## 2022-04-22 ENCOUNTER — Other Ambulatory Visit: Payer: Self-pay

## 2022-04-22 ENCOUNTER — Encounter: Payer: Self-pay | Admitting: *Deleted

## 2022-04-22 DIAGNOSIS — C3492 Malignant neoplasm of unspecified part of left bronchus or lung: Secondary | ICD-10-CM | POA: Diagnosis not present

## 2022-04-22 DIAGNOSIS — C3412 Malignant neoplasm of upper lobe, left bronchus or lung: Secondary | ICD-10-CM | POA: Diagnosis not present

## 2022-04-22 DIAGNOSIS — Z510 Encounter for antineoplastic radiation therapy: Secondary | ICD-10-CM | POA: Diagnosis not present

## 2022-04-22 DIAGNOSIS — F17210 Nicotine dependence, cigarettes, uncomplicated: Secondary | ICD-10-CM | POA: Diagnosis not present

## 2022-04-22 LAB — RAD ONC ARIA SESSION SUMMARY
Course Elapsed Days: 15
Plan Fractions Treated to Date: 12
Plan Prescribed Dose Per Fraction: 2 Gy
Plan Total Fractions Prescribed: 33
Plan Total Prescribed Dose: 66 Gy
Reference Point Dosage Given to Date: 24 Gy
Reference Point Session Dosage Given: 2 Gy
Session Number: 12

## 2022-04-22 NOTE — Progress Notes (Signed)
I followed up on Francisco Austin treatment plan schedule. He is set up at this time.

## 2022-04-23 ENCOUNTER — Other Ambulatory Visit: Payer: Self-pay

## 2022-04-23 ENCOUNTER — Ambulatory Visit
Admission: RE | Admit: 2022-04-23 | Discharge: 2022-04-23 | Disposition: A | Discharge status: Home / Self Care | Payer: Medicare Other | Source: Ambulatory Visit | Attending: Radiation Oncology | Admitting: Radiation Oncology

## 2022-04-23 DIAGNOSIS — C3412 Malignant neoplasm of upper lobe, left bronchus or lung: Secondary | ICD-10-CM | POA: Diagnosis not present

## 2022-04-23 DIAGNOSIS — F17210 Nicotine dependence, cigarettes, uncomplicated: Secondary | ICD-10-CM | POA: Diagnosis not present

## 2022-04-23 DIAGNOSIS — Z510 Encounter for antineoplastic radiation therapy: Secondary | ICD-10-CM | POA: Diagnosis not present

## 2022-04-23 DIAGNOSIS — C3492 Malignant neoplasm of unspecified part of left bronchus or lung: Secondary | ICD-10-CM | POA: Diagnosis not present

## 2022-04-23 LAB — RAD ONC ARIA SESSION SUMMARY
Course Elapsed Days: 16
Plan Fractions Treated to Date: 13
Plan Prescribed Dose Per Fraction: 2 Gy
Plan Total Fractions Prescribed: 33
Plan Total Prescribed Dose: 66 Gy
Reference Point Dosage Given to Date: 26 Gy
Reference Point Session Dosage Given: 2 Gy
Session Number: 13

## 2022-04-24 ENCOUNTER — Other Ambulatory Visit: Payer: Self-pay

## 2022-04-24 ENCOUNTER — Ambulatory Visit
Admission: RE | Admit: 2022-04-24 | Discharge: 2022-04-24 | Disposition: A | Discharge status: Home / Self Care | Payer: Medicare Other | Source: Ambulatory Visit | Attending: Radiation Oncology | Admitting: Radiation Oncology

## 2022-04-24 DIAGNOSIS — F17210 Nicotine dependence, cigarettes, uncomplicated: Secondary | ICD-10-CM | POA: Diagnosis not present

## 2022-04-24 DIAGNOSIS — Z510 Encounter for antineoplastic radiation therapy: Secondary | ICD-10-CM | POA: Diagnosis not present

## 2022-04-24 DIAGNOSIS — C3492 Malignant neoplasm of unspecified part of left bronchus or lung: Secondary | ICD-10-CM | POA: Diagnosis not present

## 2022-04-24 DIAGNOSIS — C3412 Malignant neoplasm of upper lobe, left bronchus or lung: Secondary | ICD-10-CM | POA: Diagnosis not present

## 2022-04-24 LAB — RAD ONC ARIA SESSION SUMMARY
Course Elapsed Days: 17
Plan Fractions Treated to Date: 14
Plan Prescribed Dose Per Fraction: 2 Gy
Plan Total Fractions Prescribed: 33
Plan Total Prescribed Dose: 66 Gy
Reference Point Dosage Given to Date: 28 Gy
Reference Point Session Dosage Given: 2 Gy
Session Number: 14

## 2022-04-25 ENCOUNTER — Other Ambulatory Visit: Payer: Self-pay

## 2022-04-25 ENCOUNTER — Ambulatory Visit
Admission: RE | Admit: 2022-04-25 | Discharge: 2022-04-25 | Disposition: A | Discharge status: Home / Self Care | Payer: Medicare Other | Source: Ambulatory Visit | Attending: Radiation Oncology | Admitting: Radiation Oncology

## 2022-04-25 DIAGNOSIS — C3412 Malignant neoplasm of upper lobe, left bronchus or lung: Secondary | ICD-10-CM | POA: Diagnosis not present

## 2022-04-25 DIAGNOSIS — Z510 Encounter for antineoplastic radiation therapy: Secondary | ICD-10-CM | POA: Diagnosis not present

## 2022-04-25 DIAGNOSIS — C3492 Malignant neoplasm of unspecified part of left bronchus or lung: Secondary | ICD-10-CM | POA: Diagnosis not present

## 2022-04-25 DIAGNOSIS — E1169 Type 2 diabetes mellitus with other specified complication: Secondary | ICD-10-CM | POA: Diagnosis not present

## 2022-04-25 DIAGNOSIS — G894 Chronic pain syndrome: Secondary | ICD-10-CM | POA: Diagnosis not present

## 2022-04-25 DIAGNOSIS — E7800 Pure hypercholesterolemia, unspecified: Secondary | ICD-10-CM | POA: Diagnosis not present

## 2022-04-25 DIAGNOSIS — F17210 Nicotine dependence, cigarettes, uncomplicated: Secondary | ICD-10-CM | POA: Diagnosis not present

## 2022-04-25 DIAGNOSIS — I10 Essential (primary) hypertension: Secondary | ICD-10-CM | POA: Diagnosis not present

## 2022-04-25 DIAGNOSIS — I2510 Atherosclerotic heart disease of native coronary artery without angina pectoris: Secondary | ICD-10-CM | POA: Diagnosis not present

## 2022-04-25 DIAGNOSIS — K219 Gastro-esophageal reflux disease without esophagitis: Secondary | ICD-10-CM | POA: Diagnosis not present

## 2022-04-25 DIAGNOSIS — C3490 Malignant neoplasm of unspecified part of unspecified bronchus or lung: Secondary | ICD-10-CM | POA: Diagnosis not present

## 2022-04-25 LAB — RAD ONC ARIA SESSION SUMMARY
Course Elapsed Days: 18
Plan Fractions Treated to Date: 15
Plan Prescribed Dose Per Fraction: 2 Gy
Plan Total Fractions Prescribed: 33
Plan Total Prescribed Dose: 66 Gy
Reference Point Dosage Given to Date: 30 Gy
Reference Point Session Dosage Given: 2 Gy
Session Number: 15

## 2022-04-25 MED FILL — Fosaprepitant Dimeglumine For IV Infusion 150 MG (Base Eq): INTRAVENOUS | Qty: 5 | Status: AC

## 2022-04-28 ENCOUNTER — Inpatient Hospital Stay: Payer: Medicare Other

## 2022-04-28 ENCOUNTER — Encounter: Payer: Self-pay | Admitting: Internal Medicine

## 2022-04-28 ENCOUNTER — Other Ambulatory Visit: Payer: Medicare Other

## 2022-04-28 ENCOUNTER — Other Ambulatory Visit: Payer: Self-pay

## 2022-04-28 ENCOUNTER — Inpatient Hospital Stay (HOSPITAL_BASED_OUTPATIENT_CLINIC_OR_DEPARTMENT_OTHER): Payer: Medicare Other | Admitting: Internal Medicine

## 2022-04-28 ENCOUNTER — Ambulatory Visit
Admission: RE | Admit: 2022-04-28 | Discharge: 2022-04-28 | Disposition: A | Discharge status: Home / Self Care | Payer: Medicare Other | Source: Ambulatory Visit | Attending: Radiation Oncology | Admitting: Radiation Oncology

## 2022-04-28 VITALS — BP 100/62 | HR 86 | Temp 97.6°F | Resp 16 | Wt 202.5 lb

## 2022-04-28 DIAGNOSIS — C3492 Malignant neoplasm of unspecified part of left bronchus or lung: Secondary | ICD-10-CM

## 2022-04-28 DIAGNOSIS — Z5111 Encounter for antineoplastic chemotherapy: Secondary | ICD-10-CM

## 2022-04-28 DIAGNOSIS — C3412 Malignant neoplasm of upper lobe, left bronchus or lung: Secondary | ICD-10-CM | POA: Diagnosis not present

## 2022-04-28 DIAGNOSIS — Z510 Encounter for antineoplastic radiation therapy: Secondary | ICD-10-CM | POA: Diagnosis not present

## 2022-04-28 DIAGNOSIS — F17210 Nicotine dependence, cigarettes, uncomplicated: Secondary | ICD-10-CM | POA: Diagnosis not present

## 2022-04-28 LAB — CMP (CANCER CENTER ONLY)
ALT: 25 U/L (ref 0–44)
AST: 16 U/L (ref 15–41)
Albumin: 4 g/dL (ref 3.5–5.0)
Alkaline Phosphatase: 89 U/L (ref 38–126)
Anion gap: 8 (ref 5–15)
BUN: 14 mg/dL (ref 8–23)
CO2: 25 mmol/L (ref 22–32)
Calcium: 9.1 mg/dL (ref 8.9–10.3)
Chloride: 103 mmol/L (ref 98–111)
Creatinine: 1.02 mg/dL (ref 0.61–1.24)
GFR, Estimated: >60 mL/min (ref >60)
Glucose, Bld: 110 mg/dL — ABNORMAL HIGH (ref 70–99)
Potassium: 4.1 mmol/L (ref 3.5–5.1)
Sodium: 136 mmol/L (ref 135–145)
Total Bilirubin: 0.2 mg/dL — ABNORMAL LOW (ref 0.3–1.2)
Total Protein: 6.9 g/dL (ref 6.5–8.1)

## 2022-04-28 LAB — CBC WITH DIFFERENTIAL (CANCER CENTER ONLY)
Abs Immature Granulocytes: 0.11 10*3/uL — ABNORMAL HIGH (ref 0.00–0.07)
Basophils Absolute: 0.1 10*3/uL (ref 0.0–0.1)
Basophils Relative: 1 %
Eosinophils Absolute: 0 10*3/uL (ref 0.0–0.5)
Eosinophils Relative: 0 %
HCT: 34.4 % — ABNORMAL LOW (ref 39.0–52.0)
Hemoglobin: 12 g/dL — ABNORMAL LOW (ref 13.0–17.0)
Immature Granulocytes: 2 %
Lymphocytes Relative: 20 %
Lymphs Abs: 1 10*3/uL (ref 0.7–4.0)
MCH: 31.3 pg (ref 26.0–34.0)
MCHC: 34.9 g/dL (ref 30.0–36.0)
MCV: 89.6 fL (ref 80.0–100.0)
Monocytes Absolute: 1 10*3/uL (ref 0.1–1.0)
Monocytes Relative: 21 %
Neutro Abs: 2.8 10*3/uL (ref 1.7–7.7)
Neutrophils Relative %: 56 %
Platelet Count: 671 10*3/uL — ABNORMAL HIGH (ref 150–400)
RBC: 3.84 MIL/uL — ABNORMAL LOW (ref 4.22–5.81)
RDW: 13.7 % (ref 11.5–15.5)
WBC Count: 5 10*3/uL (ref 4.0–10.5)
nRBC: 0 % (ref 0.0–0.2)

## 2022-04-28 LAB — RAD ONC ARIA SESSION SUMMARY
Course Elapsed Days: 21
Plan Fractions Treated to Date: 16
Plan Prescribed Dose Per Fraction: 2 Gy
Plan Total Fractions Prescribed: 33
Plan Total Prescribed Dose: 66 Gy
Reference Point Dosage Given to Date: 32 Gy
Reference Point Session Dosage Given: 2 Gy
Session Number: 16

## 2022-04-28 LAB — MAGNESIUM: Magnesium: 1.7 mg/dL (ref 1.7–2.4)

## 2022-04-28 MED ORDER — SODIUM CHLORIDE 0.9 % IV SOLN
75.0000 mg/m2 | Freq: Once | INTRAVENOUS | Status: AC
  Administered 2022-04-28: 161 mg via INTRAVENOUS
  Filled 2022-04-28: qty 161

## 2022-04-28 MED ORDER — MAGNESIUM SULFATE 2 GM/50ML IV SOLN
2.0000 g | Freq: Once | INTRAVENOUS | Status: AC
  Administered 2022-04-28: 2 g via INTRAVENOUS
  Filled 2022-04-28: qty 50

## 2022-04-28 MED ORDER — POTASSIUM CHLORIDE IN NACL 20-0.9 MEQ/L-% IV SOLN
Freq: Once | INTRAVENOUS | Status: AC
  Filled 2022-04-28: qty 1000

## 2022-04-28 MED ORDER — SODIUM CHLORIDE 0.9 % IV SOLN
150.0000 mg | Freq: Once | INTRAVENOUS | Status: AC
  Administered 2022-04-28: 150 mg via INTRAVENOUS
  Filled 2022-04-28: qty 150

## 2022-04-28 MED ORDER — SODIUM CHLORIDE 0.9 % IV SOLN: Freq: Once | INTRAVENOUS | Status: AC

## 2022-04-28 MED ORDER — PALONOSETRON HCL INJECTION 0.25 MG/5ML
0.2500 mg | Freq: Once | INTRAVENOUS | Status: AC
  Administered 2022-04-28: 0.25 mg via INTRAVENOUS
  Filled 2022-04-28: qty 5

## 2022-04-28 MED ORDER — SODIUM CHLORIDE 0.9 % IV SOLN
100.0000 mg/m2 | Freq: Once | INTRAVENOUS | Status: AC
  Administered 2022-04-28: 220 mg via INTRAVENOUS
  Filled 2022-04-28: qty 11

## 2022-04-28 NOTE — Progress Notes (Signed)
[facility] [phone]   [phone]  OFFICE PROGRESS NOTE  Johny Blamer, MD [address] [place] [address]  DIAGNOSIS:  limited stage (T1c, N2, M0) small cell lung cancer with mixture of squamous cell carcinoma in the left upper lobe presented with left upper lobe lung nodule in addition to left hilar and mediastinal lymphadenopathy diagnosed in May 2023.  PRIOR THERAPY: None  CURRENT THERAPY:  systemic chemotherapy with cisplatin 75 Mg/M2 on day 1 and etoposide 100 Mg/M2 on days 1, 2 and 3 every 3 weeks for 4 cycles.  This will be concurrent with radiotherapy for around 6 weeks during this course of treatment.  INTERVAL HISTORY: Francisco Austin 71 y.o. male returns to the clinic today for follow-up visit.  The patient is feeling fine today with no concerning complaints except for mild fatigue.  He tolerated the first cycle of his treatment fairly well except for some soreness in his chest from the radiation treatment.  He denied having any shortness of breath, cough or hemoptysis.  He has occasional nausea with no vomiting, diarrhea or constipation.  He has no headache or visual changes.  He has no weight loss or night sweats.  He is here today for evaluation before starting cycle #2 of his treatment.  MEDICAL HISTORY: Past Medical History:  Diagnosis Date   Anxiety    Back pain    CAD (coronary artery disease)    COPD (chronic obstructive pulmonary disease) (HCC)    History of kidney stones    HLD (hyperlipidemia)    HTN (hypertension)    Pre-diabetes     ALLERGIES:  is allergic to gabapentin, morphine, other, prednisone, alprazolam, fentanyl, niacin, and penicillamine.  MEDICATIONS:  Current Outpatient Medications  Medication Sig Dispense Refill   albuterol (VENTOLIN HFA) 108 (90 Base) MCG/ACT inhaler Inhale 2 puffs into the lungs every 6 (six) hours as needed for wheezing or shortness of breath.     aspirin EC 81  MG tablet Take 81 mg by mouth daily.     atorvastatin (LIPITOR) 80 MG tablet Take 1 tablet (80 mg total) by mouth daily. 90 tablet 1   Cholecalciferol 50 MCG (2000 UT) TABS Take 2,000 Units by mouth daily.     clopidogrel (PLAVIX) 75 MG tablet Take 75 mg by mouth daily.     diclofenac sodium (VOLTAREN) 1 % GEL Apply 1 g topically daily as needed (pain).     diltiazem (TIAZAC) 240 MG 24 hr capsule Take 240 mg by mouth daily.     esomeprazole (NEXIUM) 40 MG capsule Take 40 mg by mouth daily.     guaiFENesin (ROBITUSSIN) 100 MG/5ML liquid Take 10 mLs by mouth every 4 (four) hours as needed for cough or to loosen phlegm. 473 mL 5   LORazepam (ATIVAN) 1 MG tablet Take 1 mg by mouth 3 (three) times daily.     nitroGLYCERIN (NITROSTAT) 0.4 MG SL tablet Place 0.4 mg under the tongue every 5 (five) minutes as needed for chest pain.     oxyCODONE (OXY IR/ROXICODONE) 5 MG immediate release tablet Take 10 mg by mouth every 6 (six) hours as needed for severe pain (For 30 days). Verified with [facility].     prochlorperazine (COMPAZINE) 10 MG tablet Take 1 tablet (10 mg total) by mouth every 6 (six) hours as needed. 30 tablet 2   sucralfate (CARAFATE) 1 g tablet Take 1 tablet (1 g total) by mouth  4 (four) times daily -  with meals and at bedtime. 5 min before meals for radiation induced esophagitis 120 tablet 2   telmisartan (MICARDIS) 80 MG tablet Take 80 mg by mouth daily.     umeclidinium-vilanterol (ANORO ELLIPTA) 62.5-25 MCG/INH AEPB Inhale 1 puff into the lungs daily.     vitamin B-12 (CYANOCOBALAMIN) 500 MCG tablet Take 500 mcg by mouth daily.     No current facility-administered medications for this visit.    SURGICAL HISTORY:  Past Surgical History:  Procedure Laterality Date   APPENDECTOMY     CORONARY STENT INTERVENTION N/A 12/24/2021   Procedure: CORONARY STENT INTERVENTION;  Surgeon: Corky Crafts, MD;  Location: [facility] INVASIVE CV LAB;  Service: Cardiovascular;  Laterality: N/A;    HEMORROIDECTOMY     INTRAVASCULAR ULTRASOUND/IVUS N/A 12/24/2021   Procedure: Intravascular Ultrasound/IVUS;  Surgeon: Corky Crafts, MD;  Location: [facility] INVASIVE CV LAB;  Service: Cardiovascular;  Laterality: N/A;   LEFT HEART CATH AND CORONARY ANGIOGRAPHY N/A 12/23/2021   Procedure: LEFT HEART CATH AND CORONARY ANGIOGRAPHY;  Surgeon: Runell Gess, MD;  Location: [facility] INVASIVE CV LAB;  Service: Cardiovascular;  Laterality: N/A;   TONSILLECTOMY     VIDEO BRONCHOSCOPY WITH ENDOBRONCHIAL ULTRASOUND N/A 03/12/2022   Procedure: VIDEO BRONCHOSCOPY WITH ENDOBRONCHIAL ULTRASOUND;  Surgeon: Loreli Slot, MD;  Location: [facility] OR;  Service: Thoracic;  Laterality: N/A;    REVIEW OF SYSTEMS:  A comprehensive review of systems was negative except for: Constitutional: positive for fatigue Gastrointestinal: positive for nausea   PHYSICAL EXAMINATION: General appearance: alert, cooperative, fatigued, and no distress Head: Normocephalic, without obvious abnormality, atraumatic Neck: no adenopathy, no JVD, supple, symmetrical, trachea midline, and thyroid not enlarged, symmetric, no tenderness/mass/nodules Lymph nodes: Cervical, supraclavicular, and axillary nodes normal. Resp: clear to auscultation bilaterally Back: symmetric, no curvature. ROM normal. No CVA tenderness. Cardio: regular rate and rhythm, S1, S2 normal, no murmur, click, rub or gallop GI: soft, non-tender; bowel sounds normal; no masses,  no organomegaly Extremities: extremities normal, atraumatic, no cyanosis or edema  ECOG PERFORMANCE STATUS: 1 - Symptomatic but completely ambulatory  Blood pressure 100/62, pulse 86, temperature 97.6 F (36.4 C), temperature source Tympanic, resp. rate 16, weight 202 lb 8 oz (91.9 kg), SpO2 96 %.  LABORATORY DATA: Lab Results  Component Value Date   WBC 5.0 04/28/2022   HGB 12.0 (L) 04/28/2022   HCT 34.4 (L) 04/28/2022   MCV 89.6 04/28/2022   PLT 671 (H) 04/28/2022      Chemistry       Component Value Date/Time   NA 136 04/28/2022 0743   NA 140 12/13/2021 0900   K 4.1 04/28/2022 0743   CL 103 04/28/2022 0743   CO2 25 04/28/2022 0743   BUN 14 04/28/2022 0743   BUN 9 12/13/2021 0900   CREATININE 1.02 04/28/2022 0743      Component Value Date/Time   CALCIUM 9.1 04/28/2022 0743   ALKPHOS 89 04/28/2022 0743   AST 16 04/28/2022 0743   ALT 25 04/28/2022 0743   BILITOT 0.2 (L) 04/28/2022 0743       RADIOGRAPHIC STUDIES: MR Brain W Wo Contrast  Result Date: 04/02/2022 CLINICAL DATA:  Brain metastases suspected, lung cancer, r/o brain mets EXAM: MRI HEAD WITHOUT AND WITH CONTRAST TECHNIQUE: Multiplanar, multiecho pulse sequences of the brain and surrounding structures were obtained without and with intravenous contrast. CONTRAST:  19mL MULTIHANCE GADOBENATE DIMEGLUMINE 529 MG/ML IV SOLN COMPARISON:  None Available. FINDINGS: Brain: There is no acute  infarction or intracranial hemorrhage. There is no intracranial mass, mass effect, or edema. There is no hydrocephalus or extra-axial fluid collection. Ventricles and sulci are within normal limits in size and configuration. Minimal patchy T2 hyperintensity in the supratentorial white matter is nonspecific but may reflect minor chronic microvascular ischemic changes. No abnormal enhancement. Vascular: Major vessel flow voids at the skull base are preserved. Skull and upper cervical spine: Normal marrow signal is preserved. Sinuses/Orbits: Mild paranasal sinus mucosal thickening. Right maxillary sinus retention cyst or polyp. Bilateral lens replacements. Other: Sella is unremarkable. Patchy bilateral mastoid fluid opacification. IMPRESSION: No evidence of intracranial metastatic disease. Electronically Signed   By: Guadlupe Spanish M.D.   On: 04/02/2022 09:37    ASSESSMENT AND PLAN: This is a very pleasant 71 years old white male diagnosed with limited stage (T1c, N2, M0) small cell lung cancer with mixture of squamous cell carcinoma in  the left upper lobe presented with left upper lobe lung nodule in addition to left hilar and mediastinal lymphadenopathy diagnosed in May 2023.  The patient is currently undergoing systemic chemotherapy with cisplatin 75 Mg/M2 on day 1 and etoposide 100 Mg/M2 on days 1, 2 and 3 status post 1 cycle.  This is concurrent with radiotherapy. The patient tolerated the first cycle of his treatment well with no concerning adverse effects. I recommended for the patient to proceed with cycle #2 today as planned. I will see him back for follow-up visit in 3 weeks for evaluation before starting cycle #3. The patient was advised to call immediately if he has any other concerning symptoms in the interval.  The patient voices understanding of current disease status and treatment options and is in agreement with the current care plan.  All questions were answered. The patient knows to call the clinic with any problems, questions or concerns. We can certainly see the patient much sooner if necessary.  The total time spent in the appointment was 20 minutes.  Disclaimer: This note was dictated with voice recognition software. Similar sounding words can inadvertently be transcribed and may not be corrected upon review.

## 2022-04-29 ENCOUNTER — Ambulatory Visit
Admission: RE | Admit: 2022-04-29 | Discharge: 2022-04-29 | Disposition: A | Discharge status: Home / Self Care | Payer: Medicare Other | Source: Ambulatory Visit | Attending: Radiation Oncology | Admitting: Radiation Oncology

## 2022-04-29 ENCOUNTER — Inpatient Hospital Stay: Payer: Medicare Other

## 2022-04-29 ENCOUNTER — Other Ambulatory Visit: Payer: Self-pay

## 2022-04-29 ENCOUNTER — Inpatient Hospital Stay (HOSPITAL_BASED_OUTPATIENT_CLINIC_OR_DEPARTMENT_OTHER): Payer: Medicare Other | Admitting: Physician Assistant

## 2022-04-29 ENCOUNTER — Encounter: Payer: Self-pay | Admitting: *Deleted

## 2022-04-29 VITALS — BP 123/76 | HR 84 | Temp 97.7°F | Resp 18

## 2022-04-29 DIAGNOSIS — C3492 Malignant neoplasm of unspecified part of left bronchus or lung: Secondary | ICD-10-CM

## 2022-04-29 DIAGNOSIS — C3412 Malignant neoplasm of upper lobe, left bronchus or lung: Secondary | ICD-10-CM | POA: Diagnosis not present

## 2022-04-29 DIAGNOSIS — F17210 Nicotine dependence, cigarettes, uncomplicated: Secondary | ICD-10-CM | POA: Diagnosis not present

## 2022-04-29 DIAGNOSIS — Z510 Encounter for antineoplastic radiation therapy: Secondary | ICD-10-CM | POA: Diagnosis not present

## 2022-04-29 DIAGNOSIS — M79601 Pain in right arm: Secondary | ICD-10-CM

## 2022-04-29 LAB — RAD ONC ARIA SESSION SUMMARY
Course Elapsed Days: 22
Plan Fractions Treated to Date: 17
Plan Prescribed Dose Per Fraction: 2 Gy
Plan Total Fractions Prescribed: 33
Plan Total Prescribed Dose: 66 Gy
Reference Point Dosage Given to Date: 34 Gy
Reference Point Session Dosage Given: 2 Gy
Session Number: 17

## 2022-04-29 MED ORDER — SODIUM CHLORIDE 0.9 % IV SOLN
100.0000 mg/m2 | Freq: Once | INTRAVENOUS | Status: AC
  Administered 2022-04-29: 220 mg via INTRAVENOUS
  Filled 2022-04-29: qty 11

## 2022-04-29 MED ORDER — SODIUM CHLORIDE 0.9 % IV SOLN: Freq: Once | INTRAVENOUS | Status: AC

## 2022-04-29 MED ORDER — PROCHLORPERAZINE EDISYLATE 10 MG/2ML IJ SOLN
10.0000 mg | Freq: Once | INTRAMUSCULAR | Status: AC
  Administered 2022-04-29: 10 mg via INTRAVENOUS
  Filled 2022-04-29: qty 2

## 2022-04-29 NOTE — Progress Notes (Signed)
Patient seen by Carnella Guadalajara, PA because his right hand and wrist were swollen and there was some redness. The previous day he had gotten his treatment and the IV was in the right hand. See further documentation per PA.

## 2022-04-30 ENCOUNTER — Other Ambulatory Visit: Payer: Self-pay

## 2022-04-30 ENCOUNTER — Inpatient Hospital Stay: Payer: Medicare Other

## 2022-04-30 ENCOUNTER — Ambulatory Visit
Admission: RE | Admit: 2022-04-30 | Discharge: 2022-04-30 | Disposition: A | Discharge status: Home / Self Care | Payer: Medicare Other | Source: Ambulatory Visit | Attending: Radiation Oncology | Admitting: Radiation Oncology

## 2022-04-30 VITALS — BP 130/70 | HR 90 | Temp 97.7°F | Resp 18

## 2022-04-30 DIAGNOSIS — C3412 Malignant neoplasm of upper lobe, left bronchus or lung: Secondary | ICD-10-CM | POA: Diagnosis not present

## 2022-04-30 DIAGNOSIS — C3492 Malignant neoplasm of unspecified part of left bronchus or lung: Secondary | ICD-10-CM

## 2022-04-30 DIAGNOSIS — Z510 Encounter for antineoplastic radiation therapy: Secondary | ICD-10-CM | POA: Diagnosis not present

## 2022-04-30 DIAGNOSIS — F17210 Nicotine dependence, cigarettes, uncomplicated: Secondary | ICD-10-CM | POA: Diagnosis not present

## 2022-04-30 LAB — RAD ONC ARIA SESSION SUMMARY
Course Elapsed Days: 23
Plan Fractions Treated to Date: 18
Plan Prescribed Dose Per Fraction: 2 Gy
Plan Total Fractions Prescribed: 33
Plan Total Prescribed Dose: 66 Gy
Reference Point Dosage Given to Date: 36 Gy
Reference Point Session Dosage Given: 2 Gy
Session Number: 18

## 2022-04-30 MED ORDER — SODIUM CHLORIDE 0.9 % IV SOLN
100.0000 mg/m2 | Freq: Once | INTRAVENOUS | Status: AC
  Administered 2022-04-30: 220 mg via INTRAVENOUS
  Filled 2022-04-30: qty 11

## 2022-04-30 MED ORDER — PROCHLORPERAZINE EDISYLATE 10 MG/2ML IJ SOLN
10.0000 mg | Freq: Once | INTRAMUSCULAR | Status: AC
  Administered 2022-04-30: 10 mg via INTRAVENOUS
  Filled 2022-04-30: qty 2

## 2022-04-30 MED ORDER — SODIUM CHLORIDE 0.9 % IV SOLN: Freq: Once | INTRAVENOUS | Status: AC

## 2022-04-30 NOTE — Patient Instructions (Signed)
[facility]   Discharge Instructions: Thank you for choosing [facility] to provide your oncology and hematology care.   If you have a lab appointment with the [facility], please go directly to the [facility] and check in at the registration area.   Wear comfortable clothing and clothing appropriate for easy access to any Portacath or PICC line.   We strive to give you quality time with your provider. You may need to reschedule your appointment if you arrive late (15 or more minutes).  Arriving late affects you and other patients whose appointments are after yours.  Also, if you miss three or more appointments without notifying the office, you may be dismissed from the clinic at the provider's discretion.      For prescription refill requests, have your pharmacy contact our office and allow 72 hours for refills to be completed.    Today you received the following chemotherapy and/or immunotherapy agents: Etoposide      To help prevent nausea and vomiting after your treatment, we encourage you to take your nausea medication as directed.  BELOW ARE SYMPTOMS THAT SHOULD BE REPORTED IMMEDIATELY: *FEVER GREATER THAN 100.4 F (38 C) OR HIGHER *CHILLS OR SWEATING *NAUSEA AND VOMITING THAT IS NOT CONTROLLED WITH YOUR NAUSEA MEDICATION *UNUSUAL SHORTNESS OF BREATH *UNUSUAL BRUISING OR BLEEDING *URINARY PROBLEMS (pain or burning when urinating, or frequent urination) *BOWEL PROBLEMS (unusual diarrhea, constipation, pain near the anus) TENDERNESS IN MOUTH AND THROAT WITH OR WITHOUT PRESENCE OF ULCERS (sore throat, sores in mouth, or a toothache) UNUSUAL RASH, SWELLING OR PAIN  UNUSUAL VAGINAL DISCHARGE OR ITCHING   Items with * indicate a potential emergency and should be followed up as soon as possible or go to the Emergency Department if any problems should occur.  Please show the CHEMOTHERAPY ALERT CARD or IMMUNOTHERAPY ALERT CARD at check-in  to the Emergency Department and triage nurse.  Should you have questions after your visit or need to cancel or reschedule your appointment, please contact [facility]  Dept: [phone]  and follow the prompts.  Office hours are 8:00 a.m. to 4:30 p.m. Monday - Friday. Please note that voicemails left after 4:00 p.m. may not be returned until the following business day.  We are closed weekends and major holidays. You have access to a nurse at all times for urgent questions. Please call the main number to the clinic Dept: [phone] and follow the prompts.   For any non-urgent questions, you may also contact your provider using MyChart. We now offer e-Visits for anyone 71 and older to request care online for non-urgent symptoms. For details visit [email or web address].   Also download the MyChart app! Go to the app store, search "MyChart", open the app, select [facility], and log in with your MyChart username and password.  Masks are optional in the cancer centers. If you would like for your care team to wear a mask while they are taking care of you, please let them know. For doctor visits, patients may have with them one support person who is at least 71 years old. At this time, visitors are not allowed in the infusion area.

## 2022-05-01 ENCOUNTER — Ambulatory Visit
Admission: RE | Admit: 2022-05-01 | Discharge: 2022-05-01 | Disposition: A | Discharge status: Home / Self Care | Payer: Medicare Other | Source: Ambulatory Visit | Attending: Radiation Oncology | Admitting: Radiation Oncology

## 2022-05-01 ENCOUNTER — Other Ambulatory Visit: Payer: Self-pay

## 2022-05-01 DIAGNOSIS — Z510 Encounter for antineoplastic radiation therapy: Secondary | ICD-10-CM | POA: Diagnosis not present

## 2022-05-01 DIAGNOSIS — C3492 Malignant neoplasm of unspecified part of left bronchus or lung: Secondary | ICD-10-CM | POA: Diagnosis not present

## 2022-05-01 DIAGNOSIS — F17210 Nicotine dependence, cigarettes, uncomplicated: Secondary | ICD-10-CM | POA: Diagnosis not present

## 2022-05-01 DIAGNOSIS — C3412 Malignant neoplasm of upper lobe, left bronchus or lung: Secondary | ICD-10-CM | POA: Diagnosis not present

## 2022-05-01 LAB — RAD ONC ARIA SESSION SUMMARY
Course Elapsed Days: 24
Plan Fractions Treated to Date: 19
Plan Prescribed Dose Per Fraction: 2 Gy
Plan Total Fractions Prescribed: 33
Plan Total Prescribed Dose: 66 Gy
Reference Point Dosage Given to Date: 38 Gy
Reference Point Session Dosage Given: 2 Gy
Session Number: 19

## 2022-05-02 ENCOUNTER — Ambulatory Visit
Admission: RE | Admit: 2022-05-02 | Discharge: 2022-05-02 | Disposition: A | Discharge status: Home / Self Care | Payer: Medicare Other | Source: Ambulatory Visit | Attending: Radiation Oncology | Admitting: Radiation Oncology

## 2022-05-02 ENCOUNTER — Other Ambulatory Visit: Payer: Self-pay

## 2022-05-02 DIAGNOSIS — F17210 Nicotine dependence, cigarettes, uncomplicated: Secondary | ICD-10-CM | POA: Diagnosis not present

## 2022-05-02 DIAGNOSIS — Z510 Encounter for antineoplastic radiation therapy: Secondary | ICD-10-CM | POA: Diagnosis not present

## 2022-05-02 DIAGNOSIS — C3492 Malignant neoplasm of unspecified part of left bronchus or lung: Secondary | ICD-10-CM | POA: Diagnosis not present

## 2022-05-02 DIAGNOSIS — C3412 Malignant neoplasm of upper lobe, left bronchus or lung: Secondary | ICD-10-CM | POA: Diagnosis not present

## 2022-05-02 LAB — RAD ONC ARIA SESSION SUMMARY
Course Elapsed Days: 25
Plan Fractions Treated to Date: 20
Plan Prescribed Dose Per Fraction: 2 Gy
Plan Total Fractions Prescribed: 33
Plan Total Prescribed Dose: 66 Gy
Reference Point Dosage Given to Date: 40 Gy
Reference Point Session Dosage Given: 2 Gy
Session Number: 20

## 2022-05-05 ENCOUNTER — Other Ambulatory Visit: Payer: Self-pay

## 2022-05-05 ENCOUNTER — Inpatient Hospital Stay: Payer: Medicare Other | Attending: Internal Medicine

## 2022-05-05 ENCOUNTER — Ambulatory Visit
Admission: RE | Admit: 2022-05-05 | Discharge: 2022-05-05 | Disposition: A | Discharge status: Home / Self Care | Payer: Medicare Other | Source: Ambulatory Visit | Attending: Radiation Oncology | Admitting: Radiation Oncology

## 2022-05-05 DIAGNOSIS — G8929 Other chronic pain: Secondary | ICD-10-CM | POA: Insufficient documentation

## 2022-05-05 DIAGNOSIS — J432 Centrilobular emphysema: Secondary | ICD-10-CM | POA: Diagnosis not present

## 2022-05-05 DIAGNOSIS — E785 Hyperlipidemia, unspecified: Secondary | ICD-10-CM | POA: Diagnosis not present

## 2022-05-05 DIAGNOSIS — C3412 Malignant neoplasm of upper lobe, left bronchus or lung: Secondary | ICD-10-CM | POA: Diagnosis not present

## 2022-05-05 DIAGNOSIS — Z7982 Long term (current) use of aspirin: Secondary | ICD-10-CM | POA: Diagnosis not present

## 2022-05-05 DIAGNOSIS — C3492 Malignant neoplasm of unspecified part of left bronchus or lung: Secondary | ICD-10-CM

## 2022-05-05 DIAGNOSIS — I10 Essential (primary) hypertension: Secondary | ICD-10-CM | POA: Insufficient documentation

## 2022-05-05 DIAGNOSIS — Z79899 Other long term (current) drug therapy: Secondary | ICD-10-CM | POA: Diagnosis not present

## 2022-05-05 DIAGNOSIS — F419 Anxiety disorder, unspecified: Secondary | ICD-10-CM | POA: Diagnosis not present

## 2022-05-05 DIAGNOSIS — N200 Calculus of kidney: Secondary | ICD-10-CM | POA: Insufficient documentation

## 2022-05-05 DIAGNOSIS — Z5111 Encounter for antineoplastic chemotherapy: Secondary | ICD-10-CM | POA: Diagnosis not present

## 2022-05-05 DIAGNOSIS — I2510 Atherosclerotic heart disease of native coronary artery without angina pectoris: Secondary | ICD-10-CM | POA: Diagnosis not present

## 2022-05-05 DIAGNOSIS — F17210 Nicotine dependence, cigarettes, uncomplicated: Secondary | ICD-10-CM | POA: Diagnosis not present

## 2022-05-05 DIAGNOSIS — Z510 Encounter for antineoplastic radiation therapy: Secondary | ICD-10-CM | POA: Diagnosis not present

## 2022-05-05 LAB — CMP (CANCER CENTER ONLY)
ALT: 28 U/L (ref 0–44)
AST: 22 U/L (ref 15–41)
Albumin: 4.1 g/dL (ref 3.5–5.0)
Alkaline Phosphatase: 87 U/L (ref 38–126)
Anion gap: 7 (ref 5–15)
BUN: 20 mg/dL (ref 8–23)
CO2: 26 mmol/L (ref 22–32)
Calcium: 9.4 mg/dL (ref 8.9–10.3)
Chloride: 105 mmol/L (ref 98–111)
Creatinine: 1.01 mg/dL (ref 0.61–1.24)
GFR, Estimated: >60 mL/min (ref >60)
Glucose, Bld: 117 mg/dL — ABNORMAL HIGH (ref 70–99)
Potassium: 4.6 mmol/L (ref 3.5–5.1)
Sodium: 138 mmol/L (ref 135–145)
Total Bilirubin: 0.4 mg/dL (ref 0.3–1.2)
Total Protein: 7.2 g/dL (ref 6.5–8.1)

## 2022-05-05 LAB — CBC WITH DIFFERENTIAL (CANCER CENTER ONLY)
Abs Immature Granulocytes: 0.04 10*3/uL (ref 0.00–0.07)
Basophils Absolute: 0 10*3/uL (ref 0.0–0.1)
Basophils Relative: 1 %
Eosinophils Absolute: 0 10*3/uL (ref 0.0–0.5)
Eosinophils Relative: 0 %
HCT: 32 % — ABNORMAL LOW (ref 39.0–52.0)
Hemoglobin: 11.1 g/dL — ABNORMAL LOW (ref 13.0–17.0)
Immature Granulocytes: 1 %
Lymphocytes Relative: 17 %
Lymphs Abs: 0.6 10*3/uL — ABNORMAL LOW (ref 0.7–4.0)
MCH: 31.4 pg (ref 26.0–34.0)
MCHC: 34.7 g/dL (ref 30.0–36.0)
MCV: 90.7 fL (ref 80.0–100.0)
Monocytes Absolute: 0.1 10*3/uL (ref 0.1–1.0)
Monocytes Relative: 2 %
Neutro Abs: 2.6 10*3/uL (ref 1.7–7.7)
Neutrophils Relative %: 79 %
Platelet Count: 504 10*3/uL — ABNORMAL HIGH (ref 150–400)
RBC: 3.53 MIL/uL — ABNORMAL LOW (ref 4.22–5.81)
RDW: 13.9 % (ref 11.5–15.5)
Smear Review: NORMAL
WBC Count: 3.2 10*3/uL — ABNORMAL LOW (ref 4.0–10.5)
nRBC: 0 % (ref 0.0–0.2)

## 2022-05-05 LAB — RAD ONC ARIA SESSION SUMMARY
Course Elapsed Days: 28
Plan Fractions Treated to Date: 21
Plan Prescribed Dose Per Fraction: 2 Gy
Plan Total Fractions Prescribed: 33
Plan Total Prescribed Dose: 66 Gy
Reference Point Dosage Given to Date: 42 Gy
Reference Point Session Dosage Given: 2 Gy
Session Number: 21

## 2022-05-05 LAB — MAGNESIUM: Magnesium: 2 mg/dL (ref 1.7–2.4)

## 2022-05-07 ENCOUNTER — Ambulatory Visit
Admission: RE | Admit: 2022-05-07 | Discharge: 2022-05-07 | Disposition: A | Discharge status: Home / Self Care | Payer: Medicare Other | Source: Ambulatory Visit | Attending: Radiation Oncology | Admitting: Radiation Oncology

## 2022-05-07 ENCOUNTER — Other Ambulatory Visit: Payer: Self-pay

## 2022-05-07 DIAGNOSIS — N200 Calculus of kidney: Secondary | ICD-10-CM | POA: Diagnosis not present

## 2022-05-07 DIAGNOSIS — Z510 Encounter for antineoplastic radiation therapy: Secondary | ICD-10-CM | POA: Diagnosis not present

## 2022-05-07 DIAGNOSIS — Z5111 Encounter for antineoplastic chemotherapy: Secondary | ICD-10-CM | POA: Diagnosis not present

## 2022-05-07 DIAGNOSIS — I2510 Atherosclerotic heart disease of native coronary artery without angina pectoris: Secondary | ICD-10-CM | POA: Diagnosis not present

## 2022-05-07 DIAGNOSIS — Z7982 Long term (current) use of aspirin: Secondary | ICD-10-CM | POA: Diagnosis not present

## 2022-05-07 DIAGNOSIS — Z79899 Other long term (current) drug therapy: Secondary | ICD-10-CM | POA: Diagnosis not present

## 2022-05-07 DIAGNOSIS — C3412 Malignant neoplasm of upper lobe, left bronchus or lung: Secondary | ICD-10-CM | POA: Diagnosis not present

## 2022-05-07 DIAGNOSIS — F17210 Nicotine dependence, cigarettes, uncomplicated: Secondary | ICD-10-CM | POA: Diagnosis not present

## 2022-05-07 DIAGNOSIS — I10 Essential (primary) hypertension: Secondary | ICD-10-CM | POA: Diagnosis not present

## 2022-05-07 DIAGNOSIS — E785 Hyperlipidemia, unspecified: Secondary | ICD-10-CM | POA: Diagnosis not present

## 2022-05-07 DIAGNOSIS — J432 Centrilobular emphysema: Secondary | ICD-10-CM | POA: Diagnosis not present

## 2022-05-07 DIAGNOSIS — G8929 Other chronic pain: Secondary | ICD-10-CM | POA: Diagnosis not present

## 2022-05-07 LAB — RAD ONC ARIA SESSION SUMMARY
Course Elapsed Days: 30
Plan Fractions Treated to Date: 22
Plan Prescribed Dose Per Fraction: 2 Gy
Plan Total Fractions Prescribed: 33
Plan Total Prescribed Dose: 66 Gy
Reference Point Dosage Given to Date: 44 Gy
Reference Point Session Dosage Given: 2 Gy
Session Number: 22

## 2022-05-08 ENCOUNTER — Other Ambulatory Visit: Payer: Self-pay

## 2022-05-08 ENCOUNTER — Ambulatory Visit
Admission: RE | Admit: 2022-05-08 | Discharge: 2022-05-08 | Disposition: A | Discharge status: Home / Self Care | Payer: Medicare Other | Source: Ambulatory Visit | Attending: Radiation Oncology | Admitting: Radiation Oncology

## 2022-05-08 DIAGNOSIS — C3412 Malignant neoplasm of upper lobe, left bronchus or lung: Secondary | ICD-10-CM | POA: Diagnosis not present

## 2022-05-08 DIAGNOSIS — Z5111 Encounter for antineoplastic chemotherapy: Secondary | ICD-10-CM | POA: Diagnosis not present

## 2022-05-08 DIAGNOSIS — I2510 Atherosclerotic heart disease of native coronary artery without angina pectoris: Secondary | ICD-10-CM | POA: Diagnosis not present

## 2022-05-08 DIAGNOSIS — G8929 Other chronic pain: Secondary | ICD-10-CM | POA: Diagnosis not present

## 2022-05-08 DIAGNOSIS — N200 Calculus of kidney: Secondary | ICD-10-CM | POA: Diagnosis not present

## 2022-05-08 DIAGNOSIS — F17210 Nicotine dependence, cigarettes, uncomplicated: Secondary | ICD-10-CM | POA: Diagnosis not present

## 2022-05-08 DIAGNOSIS — I10 Essential (primary) hypertension: Secondary | ICD-10-CM | POA: Diagnosis not present

## 2022-05-08 DIAGNOSIS — E785 Hyperlipidemia, unspecified: Secondary | ICD-10-CM | POA: Diagnosis not present

## 2022-05-08 DIAGNOSIS — Z79899 Other long term (current) drug therapy: Secondary | ICD-10-CM | POA: Diagnosis not present

## 2022-05-08 DIAGNOSIS — J432 Centrilobular emphysema: Secondary | ICD-10-CM | POA: Diagnosis not present

## 2022-05-08 DIAGNOSIS — Z7982 Long term (current) use of aspirin: Secondary | ICD-10-CM | POA: Diagnosis not present

## 2022-05-08 DIAGNOSIS — Z510 Encounter for antineoplastic radiation therapy: Secondary | ICD-10-CM | POA: Diagnosis not present

## 2022-05-08 LAB — RAD ONC ARIA SESSION SUMMARY
Course Elapsed Days: 31
Plan Fractions Treated to Date: 23
Plan Prescribed Dose Per Fraction: 2 Gy
Plan Total Fractions Prescribed: 33
Plan Total Prescribed Dose: 66 Gy
Reference Point Dosage Given to Date: 46 Gy
Reference Point Session Dosage Given: 2 Gy
Session Number: 23

## 2022-05-09 ENCOUNTER — Ambulatory Visit
Admission: RE | Admit: 2022-05-09 | Discharge: 2022-05-09 | Disposition: A | Discharge status: Home / Self Care | Payer: Medicare Other | Source: Ambulatory Visit | Attending: Radiation Oncology | Admitting: Radiation Oncology

## 2022-05-09 ENCOUNTER — Other Ambulatory Visit: Payer: Self-pay

## 2022-05-09 DIAGNOSIS — I2510 Atherosclerotic heart disease of native coronary artery without angina pectoris: Secondary | ICD-10-CM | POA: Diagnosis not present

## 2022-05-09 DIAGNOSIS — C3412 Malignant neoplasm of upper lobe, left bronchus or lung: Secondary | ICD-10-CM | POA: Diagnosis not present

## 2022-05-09 DIAGNOSIS — N200 Calculus of kidney: Secondary | ICD-10-CM | POA: Diagnosis not present

## 2022-05-09 DIAGNOSIS — Z510 Encounter for antineoplastic radiation therapy: Secondary | ICD-10-CM | POA: Diagnosis not present

## 2022-05-09 DIAGNOSIS — Z7982 Long term (current) use of aspirin: Secondary | ICD-10-CM | POA: Diagnosis not present

## 2022-05-09 DIAGNOSIS — Z79899 Other long term (current) drug therapy: Secondary | ICD-10-CM | POA: Diagnosis not present

## 2022-05-09 DIAGNOSIS — E785 Hyperlipidemia, unspecified: Secondary | ICD-10-CM | POA: Diagnosis not present

## 2022-05-09 DIAGNOSIS — I10 Essential (primary) hypertension: Secondary | ICD-10-CM | POA: Diagnosis not present

## 2022-05-09 DIAGNOSIS — F17210 Nicotine dependence, cigarettes, uncomplicated: Secondary | ICD-10-CM | POA: Diagnosis not present

## 2022-05-09 DIAGNOSIS — G8929 Other chronic pain: Secondary | ICD-10-CM | POA: Diagnosis not present

## 2022-05-09 DIAGNOSIS — Z5111 Encounter for antineoplastic chemotherapy: Secondary | ICD-10-CM | POA: Diagnosis not present

## 2022-05-09 DIAGNOSIS — J432 Centrilobular emphysema: Secondary | ICD-10-CM | POA: Diagnosis not present

## 2022-05-09 LAB — RAD ONC ARIA SESSION SUMMARY
Course Elapsed Days: 32
Plan Fractions Treated to Date: 24
Plan Prescribed Dose Per Fraction: 2 Gy
Plan Total Fractions Prescribed: 33
Plan Total Prescribed Dose: 66 Gy
Reference Point Dosage Given to Date: 48 Gy
Reference Point Session Dosage Given: 2 Gy
Session Number: 24

## 2022-05-12 ENCOUNTER — Other Ambulatory Visit: Payer: Self-pay

## 2022-05-12 ENCOUNTER — Ambulatory Visit
Admission: RE | Admit: 2022-05-12 | Discharge: 2022-05-12 | Disposition: A | Discharge status: Home / Self Care | Payer: Medicare Other | Source: Ambulatory Visit | Attending: Radiation Oncology | Admitting: Radiation Oncology

## 2022-05-12 ENCOUNTER — Inpatient Hospital Stay: Payer: Medicare Other

## 2022-05-12 DIAGNOSIS — C3492 Malignant neoplasm of unspecified part of left bronchus or lung: Secondary | ICD-10-CM

## 2022-05-12 DIAGNOSIS — I10 Essential (primary) hypertension: Secondary | ICD-10-CM | POA: Diagnosis not present

## 2022-05-12 DIAGNOSIS — I2510 Atherosclerotic heart disease of native coronary artery without angina pectoris: Secondary | ICD-10-CM | POA: Diagnosis not present

## 2022-05-12 DIAGNOSIS — G8929 Other chronic pain: Secondary | ICD-10-CM | POA: Diagnosis not present

## 2022-05-12 DIAGNOSIS — Z7982 Long term (current) use of aspirin: Secondary | ICD-10-CM | POA: Diagnosis not present

## 2022-05-12 DIAGNOSIS — E1169 Type 2 diabetes mellitus with other specified complication: Secondary | ICD-10-CM | POA: Diagnosis not present

## 2022-05-12 DIAGNOSIS — E785 Hyperlipidemia, unspecified: Secondary | ICD-10-CM | POA: Diagnosis not present

## 2022-05-12 DIAGNOSIS — Z79899 Other long term (current) drug therapy: Secondary | ICD-10-CM | POA: Diagnosis not present

## 2022-05-12 DIAGNOSIS — N200 Calculus of kidney: Secondary | ICD-10-CM | POA: Diagnosis not present

## 2022-05-12 DIAGNOSIS — E7800 Pure hypercholesterolemia, unspecified: Secondary | ICD-10-CM | POA: Diagnosis not present

## 2022-05-12 DIAGNOSIS — C3412 Malignant neoplasm of upper lobe, left bronchus or lung: Secondary | ICD-10-CM | POA: Diagnosis not present

## 2022-05-12 DIAGNOSIS — Z510 Encounter for antineoplastic radiation therapy: Secondary | ICD-10-CM | POA: Diagnosis not present

## 2022-05-12 DIAGNOSIS — Z5111 Encounter for antineoplastic chemotherapy: Secondary | ICD-10-CM | POA: Diagnosis not present

## 2022-05-12 DIAGNOSIS — F17210 Nicotine dependence, cigarettes, uncomplicated: Secondary | ICD-10-CM | POA: Diagnosis not present

## 2022-05-12 DIAGNOSIS — J432 Centrilobular emphysema: Secondary | ICD-10-CM | POA: Diagnosis not present

## 2022-05-12 LAB — CMP (CANCER CENTER ONLY)
ALT: 36 U/L (ref 0–44)
AST: 24 U/L (ref 15–41)
Albumin: 4 g/dL (ref 3.5–5.0)
Alkaline Phosphatase: 96 U/L (ref 38–126)
Anion gap: 7 (ref 5–15)
BUN: 17 mg/dL (ref 8–23)
CO2: 25 mmol/L (ref 22–32)
Calcium: 9.3 mg/dL (ref 8.9–10.3)
Chloride: 101 mmol/L (ref 98–111)
Creatinine: 1.07 mg/dL (ref 0.61–1.24)
GFR, Estimated: >60 mL/min (ref >60)
Glucose, Bld: 112 mg/dL — ABNORMAL HIGH (ref 70–99)
Potassium: 4.6 mmol/L (ref 3.5–5.1)
Sodium: 133 mmol/L — ABNORMAL LOW (ref 135–145)
Total Bilirubin: 0.2 mg/dL — ABNORMAL LOW (ref 0.3–1.2)
Total Protein: 7.2 g/dL (ref 6.5–8.1)

## 2022-05-12 LAB — RAD ONC ARIA SESSION SUMMARY
Course Elapsed Days: 35
Plan Fractions Treated to Date: 25
Plan Prescribed Dose Per Fraction: 2 Gy
Plan Total Fractions Prescribed: 33
Plan Total Prescribed Dose: 66 Gy
Reference Point Dosage Given to Date: 50 Gy
Reference Point Session Dosage Given: 2 Gy
Session Number: 25

## 2022-05-12 LAB — CBC WITH DIFFERENTIAL (CANCER CENTER ONLY)
Abs Immature Granulocytes: 0.01 10*3/uL (ref 0.00–0.07)
Basophils Absolute: 0 10*3/uL (ref 0.0–0.1)
Basophils Relative: 1 %
Eosinophils Absolute: 0 10*3/uL (ref 0.0–0.5)
Eosinophils Relative: 2 %
HCT: 30.5 % — ABNORMAL LOW (ref 39.0–52.0)
Hemoglobin: 10.8 g/dL — ABNORMAL LOW (ref 13.0–17.0)
Immature Granulocytes: 1 %
Lymphocytes Relative: 33 %
Lymphs Abs: 0.6 10*3/uL — ABNORMAL LOW (ref 0.7–4.0)
MCH: 31.7 pg (ref 26.0–34.0)
MCHC: 35.4 g/dL (ref 30.0–36.0)
MCV: 89.4 fL (ref 80.0–100.0)
Monocytes Absolute: 0.3 10*3/uL (ref 0.1–1.0)
Monocytes Relative: 18 %
Neutro Abs: 0.8 10*3/uL — ABNORMAL LOW (ref 1.7–7.7)
Neutrophils Relative %: 45 %
Platelet Count: 57 10*3/uL — ABNORMAL LOW (ref 150–400)
RBC: 3.41 MIL/uL — ABNORMAL LOW (ref 4.22–5.81)
RDW: 13.2 % (ref 11.5–15.5)
Smear Review: NORMAL
WBC Count: 1.8 10*3/uL — ABNORMAL LOW (ref 4.0–10.5)
nRBC: 0 % (ref 0.0–0.2)

## 2022-05-12 LAB — MAGNESIUM: Magnesium: 2.1 mg/dL (ref 1.7–2.4)

## 2022-05-13 ENCOUNTER — Other Ambulatory Visit: Payer: Self-pay

## 2022-05-13 ENCOUNTER — Ambulatory Visit
Admission: RE | Admit: 2022-05-13 | Discharge: 2022-05-13 | Disposition: A | Discharge status: Home / Self Care | Payer: Medicare Other | Source: Ambulatory Visit | Attending: Radiation Oncology | Admitting: Radiation Oncology

## 2022-05-13 DIAGNOSIS — Z79899 Other long term (current) drug therapy: Secondary | ICD-10-CM | POA: Diagnosis not present

## 2022-05-13 DIAGNOSIS — C3412 Malignant neoplasm of upper lobe, left bronchus or lung: Secondary | ICD-10-CM | POA: Diagnosis not present

## 2022-05-13 DIAGNOSIS — J432 Centrilobular emphysema: Secondary | ICD-10-CM | POA: Diagnosis not present

## 2022-05-13 DIAGNOSIS — Z7982 Long term (current) use of aspirin: Secondary | ICD-10-CM | POA: Diagnosis not present

## 2022-05-13 DIAGNOSIS — N200 Calculus of kidney: Secondary | ICD-10-CM | POA: Diagnosis not present

## 2022-05-13 DIAGNOSIS — G8929 Other chronic pain: Secondary | ICD-10-CM | POA: Diagnosis not present

## 2022-05-13 DIAGNOSIS — E785 Hyperlipidemia, unspecified: Secondary | ICD-10-CM | POA: Diagnosis not present

## 2022-05-13 DIAGNOSIS — F17210 Nicotine dependence, cigarettes, uncomplicated: Secondary | ICD-10-CM | POA: Diagnosis not present

## 2022-05-13 DIAGNOSIS — I2510 Atherosclerotic heart disease of native coronary artery without angina pectoris: Secondary | ICD-10-CM | POA: Diagnosis not present

## 2022-05-13 DIAGNOSIS — I10 Essential (primary) hypertension: Secondary | ICD-10-CM | POA: Diagnosis not present

## 2022-05-13 DIAGNOSIS — Z5111 Encounter for antineoplastic chemotherapy: Secondary | ICD-10-CM | POA: Diagnosis not present

## 2022-05-13 DIAGNOSIS — Z510 Encounter for antineoplastic radiation therapy: Secondary | ICD-10-CM | POA: Diagnosis not present

## 2022-05-13 LAB — RAD ONC ARIA SESSION SUMMARY
Course Elapsed Days: 36
Plan Fractions Treated to Date: 26
Plan Prescribed Dose Per Fraction: 2 Gy
Plan Total Fractions Prescribed: 33
Plan Total Prescribed Dose: 66 Gy
Reference Point Dosage Given to Date: 52 Gy
Reference Point Session Dosage Given: 2 Gy
Session Number: 26

## 2022-05-13 NOTE — Progress Notes (Signed)
[facility] OFFICE PROGRESS NOTE  [name], MD [place] [address]  DIAGNOSIS: Limited stage (T1c, N2, M0) small cell lung cancer with mixture of squamous cell carcinoma in the left upper lobe presented with left upper lobe lung nodule in addition to left hilar and mediastinal lymphadenopathy diagnosed in May 2023.  PRIOR THERAPY: None  CURRENT THERAPY: Systemic chemotherapy with cisplatin 75 mg per metered squared on day 1 and etoposide 100 mg per metered squared on days 1, 2, and 3 IV every 3 weeks.  The patient will also receive concurrent radiation.  First dose on 04/08/2022.  Status post 2 cycles  INTERVAL HISTORY: Francisco Austin 71 y.o. male returns to the clinic today for a follow-up visit.  The patient is currently undergoing treatment for his limited stage small cell lung cancer.  He is receiving concurrent chemoradiation.  His last day radiation is tentatively scheduled for 05/22/2022, which he is looking forward to.  He is status post 2 of his total of 4 cycles of chemotherapy.  He has been tolerating treatment fairly well except for mild fatigue and some mild queasiness which is managed with his antiemetic.  Today, he denies any fever, chills, night sweats, or unexplained weight loss.  He reports he may have some mild dyspnea on exertion if he is walking up a flight of stairs or carrying something heavy. He denies chest pain or hemoptysis except he has some chest soreness at the site of radiation.  He has a cough more so associated with sinus drainage which is unchanged.  He denies any vomiting. He denies diarrhea since he used imodium. Denies any constipation.  The patient denies any headache or visual changes.  The patient is here today for evaluation and repeat blood work before starting cycle #3.   MEDICAL HISTORY: Past Medical History:  Diagnosis Date   Anxiety    Back pain    CAD (coronary artery disease)    COPD (chronic  obstructive pulmonary disease) (HCC)    History of kidney stones    HLD (hyperlipidemia)    HTN (hypertension)    Pre-diabetes     ALLERGIES:  is allergic to gabapentin, morphine, other, prednisone, alprazolam, fentanyl, niacin, and penicillamine.  MEDICATIONS:  Current Outpatient Medications  Medication Sig Dispense Refill   albuterol (VENTOLIN HFA) 108 (90 Base) MCG/ACT inhaler Inhale 2 puffs into the lungs every 6 (six) hours as needed for wheezing or shortness of breath.     aspirin EC 81 MG tablet Take 81 mg by mouth daily.     atorvastatin (LIPITOR) 80 MG tablet Take 1 tablet (80 mg total) by mouth daily. 90 tablet 1   Cholecalciferol 50 MCG (2000 UT) TABS Take 2,000 Units by mouth daily.     clopidogrel (PLAVIX) 75 MG tablet Take 75 mg by mouth daily.     diclofenac sodium (VOLTAREN) 1 % GEL Apply 1 g topically daily as needed (pain).     diltiazem (TIAZAC) 240 MG 24 hr capsule Take 240 mg by mouth daily.     esomeprazole (NEXIUM) 40 MG capsule Take 40 mg by mouth daily.     guaiFENesin (ROBITUSSIN) 100 MG/5ML liquid Take 10 mLs by mouth every 4 (four) hours as needed for cough or to loosen phlegm. 473 mL 5   LORazepam (ATIVAN) 1 MG tablet Take 1 mg by mouth 3 (three) times daily.     nitroGLYCERIN (NITROSTAT) 0.4 MG SL tablet Place 0.4 mg  under the tongue every 5 (five) minutes as needed for chest pain.     oxyCODONE (OXY IR/ROXICODONE) 5 MG immediate release tablet Take 10 mg by mouth every 6 (six) hours as needed for severe pain (For 30 days). Verified with [facility].     prochlorperazine (COMPAZINE) 10 MG tablet Take 1 tablet (10 mg total) by mouth every 6 (six) hours as needed. 30 tablet 2   sucralfate (CARAFATE) 1 g tablet Take 1 tablet (1 g total) by mouth 4 (four) times daily -  with meals and at bedtime. 5 min before meals for radiation induced esophagitis 120 tablet 2   telmisartan (MICARDIS) 80 MG tablet Take 80 mg by mouth daily.     umeclidinium-vilanterol  (ANORO ELLIPTA) 62.5-25 MCG/INH AEPB Inhale 1 puff into the lungs daily.     vitamin B-12 (CYANOCOBALAMIN) 500 MCG tablet Take 500 mcg by mouth daily.     No current facility-administered medications for this visit.    SURGICAL HISTORY:  Past Surgical History:  Procedure Laterality Date   APPENDECTOMY     CORONARY STENT INTERVENTION N/A 12/24/2021   Procedure: CORONARY STENT INTERVENTION;  Surgeon: Jettie Booze, MD;  Location: [facility] CV LAB;  Service: Cardiovascular;  Laterality: N/A;   HEMORROIDECTOMY     INTRAVASCULAR ULTRASOUND/IVUS N/A 12/24/2021   Procedure: Intravascular Ultrasound/IVUS;  Surgeon: Jettie Booze, MD;  Location: [facility] CV LAB;  Service: Cardiovascular;  Laterality: N/A;   LEFT HEART CATH AND CORONARY ANGIOGRAPHY N/A 12/23/2021   Procedure: LEFT HEART CATH AND CORONARY ANGIOGRAPHY;  Surgeon: Lorretta Harp, MD;  Location: [facility] CV LAB;  Service: Cardiovascular;  Laterality: N/A;   TONSILLECTOMY     VIDEO BRONCHOSCOPY WITH ENDOBRONCHIAL ULTRASOUND N/A 03/12/2022   Procedure: VIDEO BRONCHOSCOPY WITH ENDOBRONCHIAL ULTRASOUND;  Surgeon: Melrose Nakayama, MD;  Location: [facility];  Service: Thoracic;  Laterality: N/A;    REVIEW OF SYSTEMS:   Review of Systems  Constitutional: Positive for some fatigue. Negative for appetite change, chills, fever and unexpected weight change.  HENT:   Negative for mouth sores, nosebleeds, sore throat and trouble swallowing.   Eyes: Negative for eye problems and icterus.  Respiratory: Positive for dyspnea with certain activities such as climbing the stairs or carrying heavy objects. Positive for baseline cough from sinus drainage. Negative for hemoptysis and wheezing.   Cardiovascular: Negative for chest pain and leg swelling.  Gastrointestinal: Negative for abdominal pain, constipation, diarrhea, nausea and vomiting.  Genitourinary: Negative for bladder incontinence, difficulty urinating, dysuria, frequency  and hematuria.   Musculoskeletal: Negative for back pain, gait problem, neck pain and neck stiffness.  Skin: Negative for itching and rash.  Neurological: Negative for dizziness, extremity weakness, gait problem, headaches, light-headedness and seizures.  Hematological: Negative for adenopathy. Does not bruise/bleed easily.  Psychiatric/Behavioral: Negative for confusion, depression and sleep disturbance. The patient is not nervous/anxious.     PHYSICAL EXAMINATION:  Blood pressure 101/76, pulse 82, temperature 97.7 F (36.5 C), temperature source Temporal, resp. rate 16, height 5\' 11"  (1.803 m), weight 198 lb 1.6 oz (89.9 kg), SpO2 96 %.  ECOG PERFORMANCE STATUS: 1  Physical Exam  Constitutional: Oriented to person, place, and time and well-developed, well-nourished, and in no distress.  HENT:  Head: Normocephalic and atraumatic.  Mouth/Throat: Oropharynx is clear and moist. No oropharyngeal exudate.  Eyes: Conjunctivae are normal. Right eye exhibits no discharge. Left eye exhibits no discharge. No scleral icterus.  Neck: Normal range of motion. Neck supple.  Cardiovascular:  Normal rate, regular rhythm, normal heart sounds and intact distal pulses.   Pulmonary/Chest: Effort normal and breath sounds normal. No respiratory distress. No wheezes. No rales.  Abdominal: Soft. Bowel sounds are normal. Exhibits no distension and no mass. There is no tenderness.  Musculoskeletal: Normal range of motion. Exhibits no edema.  Lymphadenopathy:    No cervical adenopathy.  Neurological: Alert and oriented to person, place, and time. Exhibits normal muscle tone. Gait normal. Coordination normal.  Skin: Skin is warm and dry. No rash noted. Not diaphoretic. No erythema. No pallor.  Psychiatric: Mood, memory and judgment normal.  Vitals reviewed.  LABORATORY DATA: Lab Results  Component Value Date   WBC 5.0 05/19/2022   HGB 10.4 (L) 05/19/2022   HCT 30.0 (L) 05/19/2022   MCV 89.8 05/19/2022    PLT 392 05/19/2022      Chemistry      Component Value Date/Time   NA 135 05/19/2022 0818   NA 140 12/13/2021 0900   K 4.0 05/19/2022 0818   CL 103 05/19/2022 0818   CO2 25 05/19/2022 0818   BUN 12 05/19/2022 0818   BUN 9 12/13/2021 0900   CREATININE 0.98 05/19/2022 0818      Component Value Date/Time   CALCIUM 9.5 05/19/2022 0818   ALKPHOS 85 05/19/2022 0818   AST 18 05/19/2022 0818   ALT 27 05/19/2022 0818   BILITOT 0.2 (L) 05/19/2022 0818       RADIOGRAPHIC STUDIES:  No results found.   ASSESSMENT/PLAN:  This is a very pleasant 71 year old Caucasian male diagnosed with limited stage (T1c, N2, M0) small cell lung cancer.  His malignancy is a mixture of squamous cell carcinoma. He presented with a left upper lobe and a left left hilar and mediastinal lymphadenopathy.  He was diagnosed in May 2023.  The patient is currently undergoing treatment with systemic chemotherapy with cisplatin 75 mg per metered squared on day 1 and etoposide 100 mg per metered squared on days 1, 2, and 3 IV every 3 weeks. He is status post 2 cycles. The patient also receives concurrent radiation.  His last day of radiation is tentatively scheduled for 05/22/2022.  Labs were reviewed. Recommend that he proceed with cycle #3 today as scheduled.   We will see him back for follow-up visit in 3 weeks for evaluation and repeat blood work before starting cycle #4.   The patient was advised to call immediately if he has any concerning symptoms in the interval. The patient voices understanding of current disease status and treatment options and is in agreement with the current care plan. All questions were answered. The patient knows to call the clinic with any problems, questions or concerns. We can certainly see the patient much sooner if necessary      No orders of the defined types were placed in this encounter.     The total time spent in the appointment was 20-29 minutes.   Donya Hitch L  Madison Direnzo, PA-C 05/19/22

## 2022-05-14 ENCOUNTER — Other Ambulatory Visit: Payer: Self-pay

## 2022-05-14 ENCOUNTER — Ambulatory Visit
Admission: RE | Admit: 2022-05-14 | Discharge: 2022-05-14 | Disposition: A | Discharge status: Home / Self Care | Payer: Medicare Other | Source: Ambulatory Visit | Attending: Radiation Oncology | Admitting: Radiation Oncology

## 2022-05-14 DIAGNOSIS — I2510 Atherosclerotic heart disease of native coronary artery without angina pectoris: Secondary | ICD-10-CM | POA: Diagnosis not present

## 2022-05-14 DIAGNOSIS — Z510 Encounter for antineoplastic radiation therapy: Secondary | ICD-10-CM | POA: Diagnosis not present

## 2022-05-14 DIAGNOSIS — Z7982 Long term (current) use of aspirin: Secondary | ICD-10-CM | POA: Diagnosis not present

## 2022-05-14 DIAGNOSIS — C3412 Malignant neoplasm of upper lobe, left bronchus or lung: Secondary | ICD-10-CM | POA: Diagnosis not present

## 2022-05-14 DIAGNOSIS — F17210 Nicotine dependence, cigarettes, uncomplicated: Secondary | ICD-10-CM | POA: Diagnosis not present

## 2022-05-14 DIAGNOSIS — G8929 Other chronic pain: Secondary | ICD-10-CM | POA: Diagnosis not present

## 2022-05-14 DIAGNOSIS — Z79899 Other long term (current) drug therapy: Secondary | ICD-10-CM | POA: Diagnosis not present

## 2022-05-14 DIAGNOSIS — Z5111 Encounter for antineoplastic chemotherapy: Secondary | ICD-10-CM | POA: Diagnosis not present

## 2022-05-14 DIAGNOSIS — J432 Centrilobular emphysema: Secondary | ICD-10-CM | POA: Diagnosis not present

## 2022-05-14 DIAGNOSIS — N200 Calculus of kidney: Secondary | ICD-10-CM | POA: Diagnosis not present

## 2022-05-14 DIAGNOSIS — E785 Hyperlipidemia, unspecified: Secondary | ICD-10-CM | POA: Diagnosis not present

## 2022-05-14 DIAGNOSIS — I10 Essential (primary) hypertension: Secondary | ICD-10-CM | POA: Diagnosis not present

## 2022-05-14 LAB — RAD ONC ARIA SESSION SUMMARY
Course Elapsed Days: 37
Plan Fractions Treated to Date: 27
Plan Prescribed Dose Per Fraction: 2 Gy
Plan Total Fractions Prescribed: 33
Plan Total Prescribed Dose: 66 Gy
Reference Point Dosage Given to Date: 54 Gy
Reference Point Session Dosage Given: 2 Gy
Session Number: 27

## 2022-05-15 ENCOUNTER — Ambulatory Visit
Admission: RE | Admit: 2022-05-15 | Discharge: 2022-05-15 | Disposition: A | Discharge status: Home / Self Care | Payer: Medicare Other | Source: Ambulatory Visit | Attending: Radiation Oncology | Admitting: Radiation Oncology

## 2022-05-15 ENCOUNTER — Other Ambulatory Visit: Payer: Self-pay

## 2022-05-15 DIAGNOSIS — C3412 Malignant neoplasm of upper lobe, left bronchus or lung: Secondary | ICD-10-CM | POA: Diagnosis not present

## 2022-05-15 DIAGNOSIS — Z7982 Long term (current) use of aspirin: Secondary | ICD-10-CM | POA: Diagnosis not present

## 2022-05-15 DIAGNOSIS — J432 Centrilobular emphysema: Secondary | ICD-10-CM | POA: Diagnosis not present

## 2022-05-15 DIAGNOSIS — E785 Hyperlipidemia, unspecified: Secondary | ICD-10-CM | POA: Diagnosis not present

## 2022-05-15 DIAGNOSIS — F17210 Nicotine dependence, cigarettes, uncomplicated: Secondary | ICD-10-CM | POA: Diagnosis not present

## 2022-05-15 DIAGNOSIS — G8929 Other chronic pain: Secondary | ICD-10-CM | POA: Diagnosis not present

## 2022-05-15 DIAGNOSIS — Z5111 Encounter for antineoplastic chemotherapy: Secondary | ICD-10-CM | POA: Diagnosis not present

## 2022-05-15 DIAGNOSIS — N200 Calculus of kidney: Secondary | ICD-10-CM | POA: Diagnosis not present

## 2022-05-15 DIAGNOSIS — Z79899 Other long term (current) drug therapy: Secondary | ICD-10-CM | POA: Diagnosis not present

## 2022-05-15 DIAGNOSIS — I2510 Atherosclerotic heart disease of native coronary artery without angina pectoris: Secondary | ICD-10-CM | POA: Diagnosis not present

## 2022-05-15 DIAGNOSIS — Z510 Encounter for antineoplastic radiation therapy: Secondary | ICD-10-CM | POA: Diagnosis not present

## 2022-05-15 DIAGNOSIS — I10 Essential (primary) hypertension: Secondary | ICD-10-CM | POA: Diagnosis not present

## 2022-05-15 LAB — RAD ONC ARIA SESSION SUMMARY
Course Elapsed Days: 38
Plan Fractions Treated to Date: 28
Plan Prescribed Dose Per Fraction: 2 Gy
Plan Total Fractions Prescribed: 33
Plan Total Prescribed Dose: 66 Gy
Reference Point Dosage Given to Date: 56 Gy
Reference Point Session Dosage Given: 2 Gy
Session Number: 28

## 2022-05-16 ENCOUNTER — Ambulatory Visit
Admission: RE | Admit: 2022-05-16 | Discharge: 2022-05-16 | Disposition: A | Discharge status: Home / Self Care | Payer: Medicare Other | Source: Ambulatory Visit | Attending: Radiation Oncology | Admitting: Radiation Oncology

## 2022-05-16 ENCOUNTER — Other Ambulatory Visit: Payer: Self-pay

## 2022-05-16 DIAGNOSIS — Z5111 Encounter for antineoplastic chemotherapy: Secondary | ICD-10-CM | POA: Diagnosis not present

## 2022-05-16 DIAGNOSIS — J432 Centrilobular emphysema: Secondary | ICD-10-CM | POA: Diagnosis not present

## 2022-05-16 DIAGNOSIS — Z7982 Long term (current) use of aspirin: Secondary | ICD-10-CM | POA: Diagnosis not present

## 2022-05-16 DIAGNOSIS — N200 Calculus of kidney: Secondary | ICD-10-CM | POA: Diagnosis not present

## 2022-05-16 DIAGNOSIS — G8929 Other chronic pain: Secondary | ICD-10-CM | POA: Diagnosis not present

## 2022-05-16 DIAGNOSIS — I10 Essential (primary) hypertension: Secondary | ICD-10-CM | POA: Diagnosis not present

## 2022-05-16 DIAGNOSIS — I2510 Atherosclerotic heart disease of native coronary artery without angina pectoris: Secondary | ICD-10-CM | POA: Diagnosis not present

## 2022-05-16 DIAGNOSIS — C3412 Malignant neoplasm of upper lobe, left bronchus or lung: Secondary | ICD-10-CM | POA: Diagnosis not present

## 2022-05-16 DIAGNOSIS — Z510 Encounter for antineoplastic radiation therapy: Secondary | ICD-10-CM | POA: Diagnosis not present

## 2022-05-16 DIAGNOSIS — F17210 Nicotine dependence, cigarettes, uncomplicated: Secondary | ICD-10-CM | POA: Diagnosis not present

## 2022-05-16 DIAGNOSIS — Z79899 Other long term (current) drug therapy: Secondary | ICD-10-CM | POA: Diagnosis not present

## 2022-05-16 DIAGNOSIS — E785 Hyperlipidemia, unspecified: Secondary | ICD-10-CM | POA: Diagnosis not present

## 2022-05-16 LAB — RAD ONC ARIA SESSION SUMMARY
Course Elapsed Days: 39
Plan Fractions Treated to Date: 29
Plan Prescribed Dose Per Fraction: 2 Gy
Plan Total Fractions Prescribed: 33
Plan Total Prescribed Dose: 66 Gy
Reference Point Dosage Given to Date: 58 Gy
Reference Point Session Dosage Given: 2 Gy
Session Number: 29

## 2022-05-16 MED FILL — Fosaprepitant Dimeglumine For IV Infusion 150 MG (Base Eq): INTRAVENOUS | Qty: 5 | Status: AC

## 2022-05-19 ENCOUNTER — Other Ambulatory Visit: Payer: Self-pay

## 2022-05-19 ENCOUNTER — Ambulatory Visit
Admission: RE | Admit: 2022-05-19 | Discharge: 2022-05-19 | Disposition: A | Discharge status: Home / Self Care | Payer: Medicare Other | Source: Ambulatory Visit | Attending: Radiation Oncology | Admitting: Radiation Oncology

## 2022-05-19 ENCOUNTER — Inpatient Hospital Stay: Payer: Medicare Other

## 2022-05-19 ENCOUNTER — Inpatient Hospital Stay (HOSPITAL_BASED_OUTPATIENT_CLINIC_OR_DEPARTMENT_OTHER): Payer: Medicare Other | Admitting: Physician Assistant

## 2022-05-19 ENCOUNTER — Other Ambulatory Visit: Payer: Medicare Other

## 2022-05-19 VITALS — BP 103/62 | HR 77 | Resp 17

## 2022-05-19 VITALS — BP 101/76 | HR 82 | Temp 97.7°F | Resp 16 | Ht 71.0 in | Wt 198.1 lb

## 2022-05-19 DIAGNOSIS — C3492 Malignant neoplasm of unspecified part of left bronchus or lung: Secondary | ICD-10-CM | POA: Diagnosis not present

## 2022-05-19 DIAGNOSIS — E785 Hyperlipidemia, unspecified: Secondary | ICD-10-CM | POA: Diagnosis not present

## 2022-05-19 DIAGNOSIS — N200 Calculus of kidney: Secondary | ICD-10-CM | POA: Diagnosis not present

## 2022-05-19 DIAGNOSIS — J432 Centrilobular emphysema: Secondary | ICD-10-CM | POA: Diagnosis not present

## 2022-05-19 DIAGNOSIS — G8929 Other chronic pain: Secondary | ICD-10-CM | POA: Diagnosis not present

## 2022-05-19 DIAGNOSIS — I10 Essential (primary) hypertension: Secondary | ICD-10-CM | POA: Diagnosis not present

## 2022-05-19 DIAGNOSIS — Z5111 Encounter for antineoplastic chemotherapy: Secondary | ICD-10-CM

## 2022-05-19 DIAGNOSIS — Z510 Encounter for antineoplastic radiation therapy: Secondary | ICD-10-CM | POA: Diagnosis not present

## 2022-05-19 DIAGNOSIS — Z7982 Long term (current) use of aspirin: Secondary | ICD-10-CM | POA: Diagnosis not present

## 2022-05-19 DIAGNOSIS — I2510 Atherosclerotic heart disease of native coronary artery without angina pectoris: Secondary | ICD-10-CM | POA: Diagnosis not present

## 2022-05-19 DIAGNOSIS — F17210 Nicotine dependence, cigarettes, uncomplicated: Secondary | ICD-10-CM | POA: Diagnosis not present

## 2022-05-19 DIAGNOSIS — C3412 Malignant neoplasm of upper lobe, left bronchus or lung: Secondary | ICD-10-CM | POA: Diagnosis not present

## 2022-05-19 DIAGNOSIS — Z79899 Other long term (current) drug therapy: Secondary | ICD-10-CM | POA: Diagnosis not present

## 2022-05-19 LAB — CBC WITH DIFFERENTIAL (CANCER CENTER ONLY)
Abs Immature Granulocytes: 0.26 10*3/uL — ABNORMAL HIGH (ref 0.00–0.07)
Basophils Absolute: 0 10*3/uL (ref 0.0–0.1)
Basophils Relative: 1 %
Eosinophils Absolute: 0 10*3/uL (ref 0.0–0.5)
Eosinophils Relative: 1 %
HCT: 30 % — ABNORMAL LOW (ref 39.0–52.0)
Hemoglobin: 10.4 g/dL — ABNORMAL LOW (ref 13.0–17.0)
Immature Granulocytes: 5 %
Lymphocytes Relative: 15 %
Lymphs Abs: 0.8 10*3/uL (ref 0.7–4.0)
MCH: 31.1 pg (ref 26.0–34.0)
MCHC: 34.7 g/dL (ref 30.0–36.0)
MCV: 89.8 fL (ref 80.0–100.0)
Monocytes Absolute: 0.9 10*3/uL (ref 0.1–1.0)
Monocytes Relative: 17 %
Neutro Abs: 3.1 10*3/uL (ref 1.7–7.7)
Neutrophils Relative %: 61 %
Platelet Count: 392 10*3/uL (ref 150–400)
RBC: 3.34 MIL/uL — ABNORMAL LOW (ref 4.22–5.81)
RDW: 14.2 % (ref 11.5–15.5)
WBC Count: 5 10*3/uL (ref 4.0–10.5)
nRBC: 0 % (ref 0.0–0.2)

## 2022-05-19 LAB — RAD ONC ARIA SESSION SUMMARY
Course Elapsed Days: 42
Plan Fractions Treated to Date: 30
Plan Prescribed Dose Per Fraction: 2 Gy
Plan Total Fractions Prescribed: 33
Plan Total Prescribed Dose: 66 Gy
Reference Point Dosage Given to Date: 60 Gy
Reference Point Session Dosage Given: 2 Gy
Session Number: 30

## 2022-05-19 LAB — CMP (CANCER CENTER ONLY)
ALT: 27 U/L (ref 0–44)
AST: 18 U/L (ref 15–41)
Albumin: 4 g/dL (ref 3.5–5.0)
Alkaline Phosphatase: 85 U/L (ref 38–126)
Anion gap: 7 (ref 5–15)
BUN: 12 mg/dL (ref 8–23)
CO2: 25 mmol/L (ref 22–32)
Calcium: 9.5 mg/dL (ref 8.9–10.3)
Chloride: 103 mmol/L (ref 98–111)
Creatinine: 0.98 mg/dL (ref 0.61–1.24)
GFR, Estimated: >60 mL/min (ref >60)
Glucose, Bld: 147 mg/dL — ABNORMAL HIGH (ref 70–99)
Potassium: 4 mmol/L (ref 3.5–5.1)
Sodium: 135 mmol/L (ref 135–145)
Total Bilirubin: 0.2 mg/dL — ABNORMAL LOW (ref 0.3–1.2)
Total Protein: 7 g/dL (ref 6.5–8.1)

## 2022-05-19 LAB — MAGNESIUM: Magnesium: 1.8 mg/dL (ref 1.7–2.4)

## 2022-05-19 MED ORDER — MAGNESIUM SULFATE 2 GM/50ML IV SOLN
2.0000 g | Freq: Once | INTRAVENOUS | Status: AC
  Administered 2022-05-19: 2 g via INTRAVENOUS
  Filled 2022-05-19: qty 50

## 2022-05-19 MED ORDER — SODIUM CHLORIDE 0.9 % IV SOLN
150.0000 mg | Freq: Once | INTRAVENOUS | Status: AC
  Administered 2022-05-19: 150 mg via INTRAVENOUS
  Filled 2022-05-19: qty 150

## 2022-05-19 MED ORDER — PALONOSETRON HCL INJECTION 0.25 MG/5ML
0.2500 mg | Freq: Once | INTRAVENOUS | Status: AC
  Administered 2022-05-19: 0.25 mg via INTRAVENOUS
  Filled 2022-05-19: qty 5

## 2022-05-19 MED ORDER — SODIUM CHLORIDE 0.9 % IV SOLN
100.0000 mg/m2 | Freq: Once | INTRAVENOUS | Status: AC
  Administered 2022-05-19: 220 mg via INTRAVENOUS
  Filled 2022-05-19: qty 11

## 2022-05-19 MED ORDER — SODIUM CHLORIDE 0.9 % IV SOLN
75.0000 mg/m2 | Freq: Once | INTRAVENOUS | Status: AC
  Administered 2022-05-19: 161 mg via INTRAVENOUS
  Filled 2022-05-19: qty 161

## 2022-05-19 MED ORDER — SODIUM CHLORIDE 0.9 % IV SOLN: Freq: Once | INTRAVENOUS | Status: AC

## 2022-05-19 MED ORDER — POTASSIUM CHLORIDE IN NACL 20-0.9 MEQ/L-% IV SOLN
Freq: Once | INTRAVENOUS | Status: AC
  Filled 2022-05-19: qty 1000

## 2022-05-19 NOTE — Patient Instructions (Signed)
[facility]  Discharge Instructions: Thank you for choosing [facility] to provide your oncology and hematology care.   If you have a lab appointment with the [facility], please go directly to the [facility] and check in at the registration area.   Wear comfortable clothing and clothing appropriate for easy access to any Portacath or PICC line.   We strive to give you quality time with your provider. You may need to reschedule your appointment if you arrive late (15 or more minutes).  Arriving late affects you and other patients whose appointments are after yours.  Also, if you miss three or more appointments without notifying the office, you may be dismissed from the clinic at the provider's discretion.      For prescription refill requests, have your pharmacy contact our office and allow 72 hours for refills to be completed.    Today you received the following chemotherapy and/or immunotherapy agents: Cisplatin and Etoposide      To help prevent nausea and vomiting after your treatment, we encourage you to take your nausea medication as directed.  BELOW ARE SYMPTOMS THAT SHOULD BE REPORTED IMMEDIATELY: *FEVER GREATER THAN 100.4 F (38 C) OR HIGHER *CHILLS OR SWEATING *NAUSEA AND VOMITING THAT IS NOT CONTROLLED WITH YOUR NAUSEA MEDICATION *UNUSUAL SHORTNESS OF BREATH *UNUSUAL BRUISING OR BLEEDING *URINARY PROBLEMS (pain or burning when urinating, or frequent urination) *BOWEL PROBLEMS (unusual diarrhea, constipation, pain near the anus) TENDERNESS IN MOUTH AND THROAT WITH OR WITHOUT PRESENCE OF ULCERS (sore throat, sores in mouth, or a toothache) UNUSUAL RASH, SWELLING OR PAIN  UNUSUAL VAGINAL DISCHARGE OR ITCHING   Items with * indicate a potential emergency and should be followed up as soon as possible or go to the Emergency Department if any problems should occur.  Please show the CHEMOTHERAPY ALERT CARD or IMMUNOTHERAPY ALERT CARD  at check-in to the Emergency Department and triage nurse.  Should you have questions after your visit or need to cancel or reschedule your appointment, please contact [facility]  Dept: [phone]  and follow the prompts.  Office hours are 8:00 a.m. to 4:30 p.m. Monday - Friday. Please note that voicemails left after 4:00 p.m. may not be returned until the following business day.  We are closed weekends and major holidays. You have access to a nurse at all times for urgent questions. Please call the main number to the clinic Dept: [phone] and follow the prompts.   For any non-urgent questions, you may also contact your provider using MyChart. We now offer e-Visits for anyone 52 and older to request care online for non-urgent symptoms. For details visit [email or web address].   Also download the MyChart app! Go to the app store, search "MyChart", open the app, select [facility], and log in with your MyChart username and password.  Masks are optional in the cancer centers. If you would like for your care team to wear a mask while they are taking care of you, please let them know. For doctor visits, patients may have with them one support person who is at least 71 years old. At this time, visitors are not allowed in the infusion area.

## 2022-05-20 ENCOUNTER — Other Ambulatory Visit: Payer: Self-pay

## 2022-05-20 ENCOUNTER — Ambulatory Visit
Admission: RE | Admit: 2022-05-20 | Discharge: 2022-05-20 | Disposition: A | Discharge status: Home / Self Care | Payer: Medicare Other | Source: Ambulatory Visit | Attending: Radiation Oncology | Admitting: Radiation Oncology

## 2022-05-20 ENCOUNTER — Inpatient Hospital Stay: Payer: Medicare Other

## 2022-05-20 VITALS — BP 112/70 | HR 76 | Temp 97.2°F | Resp 17 | Wt 202.8 lb

## 2022-05-20 DIAGNOSIS — Z5111 Encounter for antineoplastic chemotherapy: Secondary | ICD-10-CM | POA: Diagnosis not present

## 2022-05-20 DIAGNOSIS — I10 Essential (primary) hypertension: Secondary | ICD-10-CM | POA: Diagnosis not present

## 2022-05-20 DIAGNOSIS — G8929 Other chronic pain: Secondary | ICD-10-CM | POA: Diagnosis not present

## 2022-05-20 DIAGNOSIS — N200 Calculus of kidney: Secondary | ICD-10-CM | POA: Diagnosis not present

## 2022-05-20 DIAGNOSIS — Z79899 Other long term (current) drug therapy: Secondary | ICD-10-CM | POA: Diagnosis not present

## 2022-05-20 DIAGNOSIS — J432 Centrilobular emphysema: Secondary | ICD-10-CM | POA: Diagnosis not present

## 2022-05-20 DIAGNOSIS — Z510 Encounter for antineoplastic radiation therapy: Secondary | ICD-10-CM | POA: Diagnosis not present

## 2022-05-20 DIAGNOSIS — C3412 Malignant neoplasm of upper lobe, left bronchus or lung: Secondary | ICD-10-CM | POA: Diagnosis not present

## 2022-05-20 DIAGNOSIS — Z7982 Long term (current) use of aspirin: Secondary | ICD-10-CM | POA: Diagnosis not present

## 2022-05-20 DIAGNOSIS — E785 Hyperlipidemia, unspecified: Secondary | ICD-10-CM | POA: Diagnosis not present

## 2022-05-20 DIAGNOSIS — F17210 Nicotine dependence, cigarettes, uncomplicated: Secondary | ICD-10-CM | POA: Diagnosis not present

## 2022-05-20 DIAGNOSIS — C3492 Malignant neoplasm of unspecified part of left bronchus or lung: Secondary | ICD-10-CM

## 2022-05-20 DIAGNOSIS — I2510 Atherosclerotic heart disease of native coronary artery without angina pectoris: Secondary | ICD-10-CM | POA: Diagnosis not present

## 2022-05-20 LAB — RAD ONC ARIA SESSION SUMMARY
Course Elapsed Days: 43
Plan Fractions Treated to Date: 31
Plan Prescribed Dose Per Fraction: 2 Gy
Plan Total Fractions Prescribed: 33
Plan Total Prescribed Dose: 66 Gy
Reference Point Dosage Given to Date: 62 Gy
Reference Point Session Dosage Given: 2 Gy
Session Number: 31

## 2022-05-20 MED ORDER — SODIUM CHLORIDE 0.9 % IV SOLN: Freq: Once | INTRAVENOUS | Status: AC

## 2022-05-20 MED ORDER — SODIUM CHLORIDE 0.9 % IV SOLN
100.0000 mg/m2 | Freq: Once | INTRAVENOUS | Status: AC
  Administered 2022-05-20: 220 mg via INTRAVENOUS
  Filled 2022-05-20: qty 11

## 2022-05-20 MED ORDER — PROCHLORPERAZINE EDISYLATE 10 MG/2ML IJ SOLN
10.0000 mg | Freq: Once | INTRAMUSCULAR | Status: AC
  Administered 2022-05-20: 10 mg via INTRAVENOUS
  Filled 2022-05-20: qty 2

## 2022-05-20 NOTE — Patient Instructions (Signed)
[facility]   Discharge Instructions: Thank you for choosing [facility] to provide your oncology and hematology care.   If you have a lab appointment with the [facility], please go directly to the [facility] and check in at the registration area.   Wear comfortable clothing and clothing appropriate for easy access to any Portacath or PICC line.   We strive to give you quality time with your provider. You may need to reschedule your appointment if you arrive late (15 or more minutes).  Arriving late affects you and other patients whose appointments are after yours.  Also, if you miss three or more appointments without notifying the office, you may be dismissed from the clinic at the provider's discretion.      For prescription refill requests, have your pharmacy contact our office and allow 72 hours for refills to be completed.    Today you received the following chemotherapy and/or immunotherapy agents: etoposide      To help prevent nausea and vomiting after your treatment, we encourage you to take your nausea medication as directed.  BELOW ARE SYMPTOMS THAT SHOULD BE REPORTED IMMEDIATELY: *FEVER GREATER THAN 100.4 F (38 C) OR HIGHER *CHILLS OR SWEATING *NAUSEA AND VOMITING THAT IS NOT CONTROLLED WITH YOUR NAUSEA MEDICATION *UNUSUAL SHORTNESS OF BREATH *UNUSUAL BRUISING OR BLEEDING *URINARY PROBLEMS (pain or burning when urinating, or frequent urination) *BOWEL PROBLEMS (unusual diarrhea, constipation, pain near the anus) TENDERNESS IN MOUTH AND THROAT WITH OR WITHOUT PRESENCE OF ULCERS (sore throat, sores in mouth, or a toothache) UNUSUAL RASH, SWELLING OR PAIN  UNUSUAL VAGINAL DISCHARGE OR ITCHING   Items with * indicate a potential emergency and should be followed up as soon as possible or go to the Emergency Department if any problems should occur.  Please show the CHEMOTHERAPY ALERT CARD or IMMUNOTHERAPY ALERT CARD at check-in  to the Emergency Department and triage nurse.  Should you have questions after your visit or need to cancel or reschedule your appointment, please contact [facility]  Dept: [phone]  and follow the prompts.  Office hours are 8:00 a.m. to 4:30 p.m. Monday - Friday. Please note that voicemails left after 4:00 p.m. may not be returned until the following business day.  We are closed weekends and major holidays. You have access to a nurse at all times for urgent questions. Please call the main number to the clinic Dept: [phone] and follow the prompts.   For any non-urgent questions, you may also contact your provider using MyChart. We now offer e-Visits for anyone 29 and older to request care online for non-urgent symptoms. For details visit [email or web address].   Also download the MyChart app! Go to the app store, search "MyChart", open the app, select [facility], and log in with your MyChart username and password.  Due to Covid, a mask is required upon entering the hospital/clinic. If you do not have a mask, one will be given to you upon arrival. For doctor visits, patients may have 1 support person aged 7 or older with them. For treatment visits, patients cannot have anyone with them due to current Covid guidelines and our immunocompromised population.  Etoposide, VP-16 injection What is this medication? ETOPOSIDE, VP-16 (e toe POE side) is a chemotherapy drug. It is used to treat testicular cancer, lung cancer, and other cancers. This medicine may be used for other purposes; ask your health care provider or pharmacist if you have questions. COMMON  BRAND NAME(S): Etopophos, Toposar, VePesid What should I tell my care team before I take this medication? They need to know if you have any of these conditions: infection kidney disease liver disease low blood counts, like low white cell, platelet, or red cell counts an unusual or allergic reaction to  etoposide, other medicines, foods, dyes, or preservatives pregnant or trying to get pregnant breast-feeding How should I use this medication? This medicine is for infusion into a vein. It is administered in a hospital or clinic by a specially trained health care professional. Talk to your pediatrician regarding the use of this medicine in children. Special care may be needed. Overdosage: If you think you have taken too much of this medicine contact a poison control center or emergency room at once. NOTE: This medicine is only for you. Do not share this medicine with others. What if I miss a dose? It is important not to miss your dose. Call your doctor or health care professional if you are unable to keep an appointment. What may interact with this medication? This medicine may interact with the following medications: warfarin This list may not describe all possible interactions. Give your health care provider a list of all the medicines, herbs, non-prescription drugs, or dietary supplements you use. Also tell them if you smoke, drink alcohol, or use illegal drugs. Some items may interact with your medicine. What should I watch for while using this medication? Visit your doctor for checks on your progress. This drug may make you feel generally unwell. This is not uncommon, as chemotherapy can affect healthy cells as well as cancer cells. Report any side effects. Continue your course of treatment even though you feel ill unless your doctor tells you to stop. In some cases, you may be given additional medicines to help with side effects. Follow all directions for their use. Call your doctor or health care professional for advice if you get a fever, chills or sore throat, or other symptoms of a cold or flu. Do not treat yourself. This drug decreases your body's ability to fight infections. Try to avoid being around people who are sick. This medicine may increase your risk to bruise or bleed. Call your  doctor or health care professional if you notice any unusual bleeding. Talk to your doctor about your risk of cancer. You may be more at risk for certain types of cancers if you take this medicine. Do not become pregnant while taking this medicine or for at least 6 months after stopping it. Women should inform their doctor if they wish to become pregnant or think they might be pregnant. Women of child-bearing potential will need to have a negative pregnancy test before starting this medicine. There is a potential for serious side effects to an unborn child. Talk to your health care professional or pharmacist for more information. Do not breast-feed an infant while taking this medicine. Men must use a latex condom during sexual contact with a woman while taking this medicine and for at least 4 months after stopping it. A latex condom is needed even if you have had a vasectomy. Contact your doctor right away if your partner becomes pregnant. Do not donate sperm while taking this medicine and for at least 4 months after you stop taking this medicine. Men should inform their doctors if they wish to father a child. This medicine may lower sperm counts. What side effects may I notice from receiving this medication? Side effects that you should report to  your doctor or health care professional as soon as possible: allergic reactions like skin rash, itching or hives, swelling of the face, lips, or tongue low blood counts - this medicine may decrease the number of white blood cells, red blood cells, and platelets. You may be at increased risk for infections and bleeding nausea, vomiting redness, blistering, peeling or loosening of the skin, including inside the mouth signs and symptoms of infection like fever; chills; cough; sore throat; pain or trouble passing urine signs and symptoms of low red blood cells or anemia such as unusually weak or tired; feeling faint or lightheaded; falls; breathing problems unusual  bruising or bleeding Side effects that usually do not require medical attention (report to your doctor or health care professional if they continue or are bothersome): changes in taste diarrhea hair loss loss of appetite mouth sores This list may not describe all possible side effects. Call your doctor for medical advice about side effects. You may report side effects to FDA at 1-800-FDA-1088. Where should I keep my medication? This drug is given in a hospital or clinic and will not be stored at home. NOTE: This sheet is a summary. It may not cover all possible information. If you have questions about this medicine, talk to your doctor, pharmacist, or health care provider.  2023 Elsevier/Gold Standard (2021-09-20 00:00:00)   Thrombophlebitis  Thrombophlebitis is a condition in which a blood clot and inflammation occur inside a vein. This can happen in the arms, legs, or torso. Thrombophlebitis may involve superficial veins, deep veins, or both. Superficial veins are close to the surface of the body and are part of the superficial venous system. Veins that are deeper inside the body are part of the deep venous system. When this condition happens in a superficial vein (superficial thrombophlebitis), it is usually not serious.However, when the condition happens in a vein that is part of the deep venous system (deep vein thrombosis, DVT), it can cause serious problems. What are the causes? This condition may be caused by: Infection, injury, or trauma to a vein. Inflammation of the veins. Medical conditions that can cause blood to clot more easily (hypercoagulable state). Backing up, or reflux, of blood flow through the veins (chronic venous insufficiency or venous stasis). What increases the risk? The following factors may make you more likely to develop this condition: Having a long, thin tube (catheter) put in a vein, such as a central line, port, or IV catheter. Getting certain medicines  through a catheter that can irritate the vein. Pregnancy or having recently given birth. Cancer. Obesity. Taking oral contraceptive pills (OCPs) or hormone therapy (HT) medicines. Spasms of veins. Immobilization, or not moving the limbs for prolonged periods. What are the signs or symptoms? The main symptoms of this condition are: Swelling and pain in an arm or leg. If the affected vein is in the leg, you may feel pain while standing or walking. Warmth or redness in an arm or leg. Tenderness in the affected area when it is touched. Other symptoms include: Low-grade fever. Muscle aches. A bulging or hard vein (venous distension). In some cases, there are no symptoms. How is this diagnosed? This condition may be diagnosed based on: Your symptoms and medical history. A physical exam. Tests, such as a test that uses sound waves to make images (duplex ultrasound). How is this treated? Treatment depends on how severe the condition is and which area of the body is affected. Treatment may include: Applying a warm compress or  heating pad to affected areas. This may need to be repeated several times a day. Moving the affected limb. For example, you may need to start doing walking exercises right away. You will also be encouraged to continue your usual daily activities. Raising (elevating) the affected arm or leg above the level of your heart. Medicines, such as: Anti-inflammatory medicines, such as ibuprofen. Blood thinners (anticoagulants) or anti-platelet drugs such as aspirin. Removing an IV or central line that may be causing the problem. Wearing compression stockings to help prevent blood clots and reduce swelling in your legs. Follow these instructions at home: Medicines Take over-the-counter and prescription medicines only as told by your health care provider. If you are taking blood thinners: Talk with your health care provider before you take any medicines that contain aspirin or  NSAIDs, such as ibuprofen. These medicines increase your risk for dangerous bleeding. Take your medicine exactly as told, at the same time every day. Avoid activities that could cause injury or bruising, and follow instructions about how to prevent falls. Wear a medical alert bracelet or carry a card that lists what medicines you take. Managing pain, stiffness, and swelling  If directed, apply heat to the affected area as often as told by your health care provider. Use the heat source that your health care provider recommends, such as a moist heat pack or a heating pad. Place a towel between your skin and the heat source. Leave the heat on for 20-30 minutes. Remove the heat if your skin turns bright red. This is especially important if you are unable to feel pain, heat, or cold. You have a greater risk of getting burned. Elevate the affected area above the level of your heart while you are sitting or lying down. Activity Return to your normal activities as told by your health care provider. Ask your health care provider what activities are safe for you. Avoid sitting for a long time without moving. Get up to take short walks every 1-2 hours. This is important to improve blood flow and breathing. Ask for help if you feel weak or unsteady. Do exercises as told by your health care provider. Rest as told by your health care provider. General instructions Drink enough fluid to keep your urine pale yellow. Wear compression stockings as told by your health care provider. Do not use any products that contain nicotine or tobacco. These products include cigarettes, chewing tobacco, and vaping devices, such as e-cigarettes. If you need help quitting, ask your health care provider. Keep all follow-up visits. This is important. Contact a health care provider if: You miss a dose of your blood thinner, if applicable. Your symptoms do not improve. You have unusual bruising. You have nausea, vomiting, or  diarrhea that lasts for more than a day. Get help right away if: You are breathing fast or have chest pain. You have blood in your vomit, urine, or stool. You have severe pain in your affected arm or leg or new pain in any arm or leg. You have light-headedness, dizziness, a severe headache, or confusion. These symptoms may represent a serious problem that is an emergency. Do not wait to see if the symptoms will go away. Get medical help right away. Call your local emergency services (911 in the [place] Do not drive yourself to the hospital. Summary Thrombophlebitis is a condition in which a blood clot forms in a vein, causing inflammation and often pain. This can happen in both superficial and deep veins. The main symptom  of this condition is swelling and pain around the affected vein. Tenderness and redness may also be present. Treatment may include warm compresses, compression stockings, anti-inflammatory medicines, or blood thinners. Make sure you take all medicines, especially blood thinners, as instructed and keep all follow-up visits to ensure proper healing of the vein. This information is not intended to replace advice given to you by your health care provider. Make sure you discuss any questions you have with your health care provider. Document Revised: 04/15/2021 Document Reviewed: 04/15/2021 Elsevier Patient Education  [place].

## 2022-05-21 ENCOUNTER — Ambulatory Visit
Admission: RE | Admit: 2022-05-21 | Discharge: 2022-05-21 | Disposition: A | Discharge status: Home / Self Care | Payer: Medicare Other | Source: Ambulatory Visit | Attending: Radiation Oncology | Admitting: Radiation Oncology

## 2022-05-21 ENCOUNTER — Inpatient Hospital Stay: Payer: Medicare Other

## 2022-05-21 ENCOUNTER — Other Ambulatory Visit: Payer: Self-pay

## 2022-05-21 VITALS — BP 119/74 | HR 83 | Temp 98.2°F | Resp 18

## 2022-05-21 DIAGNOSIS — C3492 Malignant neoplasm of unspecified part of left bronchus or lung: Secondary | ICD-10-CM

## 2022-05-21 DIAGNOSIS — E785 Hyperlipidemia, unspecified: Secondary | ICD-10-CM | POA: Diagnosis not present

## 2022-05-21 DIAGNOSIS — Z7982 Long term (current) use of aspirin: Secondary | ICD-10-CM | POA: Diagnosis not present

## 2022-05-21 DIAGNOSIS — N200 Calculus of kidney: Secondary | ICD-10-CM | POA: Diagnosis not present

## 2022-05-21 DIAGNOSIS — I10 Essential (primary) hypertension: Secondary | ICD-10-CM | POA: Diagnosis not present

## 2022-05-21 DIAGNOSIS — Z5111 Encounter for antineoplastic chemotherapy: Secondary | ICD-10-CM | POA: Diagnosis not present

## 2022-05-21 DIAGNOSIS — Z510 Encounter for antineoplastic radiation therapy: Secondary | ICD-10-CM | POA: Diagnosis not present

## 2022-05-21 DIAGNOSIS — I2510 Atherosclerotic heart disease of native coronary artery without angina pectoris: Secondary | ICD-10-CM | POA: Diagnosis not present

## 2022-05-21 DIAGNOSIS — G8929 Other chronic pain: Secondary | ICD-10-CM | POA: Diagnosis not present

## 2022-05-21 DIAGNOSIS — C3412 Malignant neoplasm of upper lobe, left bronchus or lung: Secondary | ICD-10-CM | POA: Diagnosis not present

## 2022-05-21 DIAGNOSIS — J432 Centrilobular emphysema: Secondary | ICD-10-CM | POA: Diagnosis not present

## 2022-05-21 DIAGNOSIS — F17210 Nicotine dependence, cigarettes, uncomplicated: Secondary | ICD-10-CM | POA: Diagnosis not present

## 2022-05-21 DIAGNOSIS — Z79899 Other long term (current) drug therapy: Secondary | ICD-10-CM | POA: Diagnosis not present

## 2022-05-21 LAB — RAD ONC ARIA SESSION SUMMARY
Course Elapsed Days: 44
Plan Fractions Treated to Date: 32
Plan Prescribed Dose Per Fraction: 2 Gy
Plan Total Fractions Prescribed: 33
Plan Total Prescribed Dose: 66 Gy
Reference Point Dosage Given to Date: 64 Gy
Reference Point Session Dosage Given: 2 Gy
Session Number: 32

## 2022-05-21 MED ORDER — SODIUM CHLORIDE 0.9 % IV SOLN: Freq: Once | INTRAVENOUS | Status: AC

## 2022-05-21 MED ORDER — PROCHLORPERAZINE EDISYLATE 10 MG/2ML IJ SOLN
10.0000 mg | Freq: Once | INTRAMUSCULAR | Status: AC
  Administered 2022-05-21: 10 mg via INTRAVENOUS
  Filled 2022-05-21: qty 2

## 2022-05-21 MED ORDER — SODIUM CHLORIDE 0.9 % IV SOLN
100.0000 mg/m2 | Freq: Once | INTRAVENOUS | Status: AC
  Administered 2022-05-21: 220 mg via INTRAVENOUS
  Filled 2022-05-21: qty 11

## 2022-05-21 NOTE — Patient Instructions (Signed)
[facility]  Discharge Instructions: Thank you for choosing [facility] to provide your oncology and hematology care.   If you have a lab appointment with the [facility], please go directly to the [facility] and check in at the registration area.   Wear comfortable clothing and clothing appropriate for easy access to any Portacath or PICC line.   We strive to give you quality time with your provider. You may need to reschedule your appointment if you arrive late (15 or more minutes).  Arriving late affects you and other patients whose appointments are after yours.  Also, if you miss three or more appointments without notifying the office, you may be dismissed from the clinic at the provider's discretion.      For prescription refill requests, have your pharmacy contact our office and allow 72 hours for refills to be completed.    Today you received the following chemotherapy and/or immunotherapy agents etoposide      To help prevent nausea and vomiting after your treatment, we encourage you to take your nausea medication as directed.  BELOW ARE SYMPTOMS THAT SHOULD BE REPORTED IMMEDIATELY: *FEVER GREATER THAN 100.4 F (38 C) OR HIGHER *CHILLS OR SWEATING *NAUSEA AND VOMITING THAT IS NOT CONTROLLED WITH YOUR NAUSEA MEDICATION *UNUSUAL SHORTNESS OF BREATH *UNUSUAL BRUISING OR BLEEDING *URINARY PROBLEMS (pain or burning when urinating, or frequent urination) *BOWEL PROBLEMS (unusual diarrhea, constipation, pain near the anus) TENDERNESS IN MOUTH AND THROAT WITH OR WITHOUT PRESENCE OF ULCERS (sore throat, sores in mouth, or a toothache) UNUSUAL RASH, SWELLING OR PAIN  UNUSUAL VAGINAL DISCHARGE OR ITCHING   Items with * indicate a potential emergency and should be followed up as soon as possible or go to the Emergency Department if any problems should occur.  Please show the CHEMOTHERAPY ALERT CARD or IMMUNOTHERAPY ALERT CARD at check-in to  the Emergency Department and triage nurse.  Should you have questions after your visit or need to cancel or reschedule your appointment, please contact [facility]  Dept: [phone]  and follow the prompts.  Office hours are 8:00 a.m. to 4:30 p.m. Monday - Friday. Please note that voicemails left after 4:00 p.m. may not be returned until the following business day.  We are closed weekends and major holidays. You have access to a nurse at all times for urgent questions. Please call the main number to the clinic Dept: [phone] and follow the prompts.   For any non-urgent questions, you may also contact your provider using MyChart. We now offer e-Visits for anyone 41 and older to request care online for non-urgent symptoms. For details visit [email or web address].   Also download the MyChart app! Go to the app store, search "MyChart", open the app, select [facility], and log in with your MyChart username and password.  Masks are optional in the cancer centers. If you would like for your care team to wear a mask while they are taking care of you, please let them know. For doctor visits, patients may have with them one support person who is at least 71 years old. At this time, visitors are not allowed in the infusion area.

## 2022-05-22 ENCOUNTER — Encounter: Payer: Self-pay | Admitting: Urology

## 2022-05-22 ENCOUNTER — Ambulatory Visit: Payer: Medicare Other

## 2022-05-22 ENCOUNTER — Other Ambulatory Visit: Payer: Self-pay

## 2022-05-22 ENCOUNTER — Ambulatory Visit
Admission: RE | Admit: 2022-05-22 | Discharge: 2022-05-22 | Disposition: A | Discharge status: Home / Self Care | Payer: Medicare Other | Source: Ambulatory Visit | Attending: Radiation Oncology | Admitting: Radiation Oncology

## 2022-05-22 DIAGNOSIS — I10 Essential (primary) hypertension: Secondary | ICD-10-CM | POA: Diagnosis not present

## 2022-05-22 DIAGNOSIS — J432 Centrilobular emphysema: Secondary | ICD-10-CM | POA: Diagnosis not present

## 2022-05-22 DIAGNOSIS — G8929 Other chronic pain: Secondary | ICD-10-CM | POA: Diagnosis not present

## 2022-05-22 DIAGNOSIS — Z7982 Long term (current) use of aspirin: Secondary | ICD-10-CM | POA: Diagnosis not present

## 2022-05-22 DIAGNOSIS — Z5111 Encounter for antineoplastic chemotherapy: Secondary | ICD-10-CM | POA: Diagnosis not present

## 2022-05-22 DIAGNOSIS — I2510 Atherosclerotic heart disease of native coronary artery without angina pectoris: Secondary | ICD-10-CM | POA: Diagnosis not present

## 2022-05-22 DIAGNOSIS — C3492 Malignant neoplasm of unspecified part of left bronchus or lung: Secondary | ICD-10-CM

## 2022-05-22 DIAGNOSIS — N200 Calculus of kidney: Secondary | ICD-10-CM | POA: Diagnosis not present

## 2022-05-22 DIAGNOSIS — F17210 Nicotine dependence, cigarettes, uncomplicated: Secondary | ICD-10-CM | POA: Diagnosis not present

## 2022-05-22 DIAGNOSIS — Z510 Encounter for antineoplastic radiation therapy: Secondary | ICD-10-CM | POA: Diagnosis not present

## 2022-05-22 DIAGNOSIS — C3412 Malignant neoplasm of upper lobe, left bronchus or lung: Secondary | ICD-10-CM | POA: Diagnosis not present

## 2022-05-22 DIAGNOSIS — E785 Hyperlipidemia, unspecified: Secondary | ICD-10-CM | POA: Diagnosis not present

## 2022-05-22 DIAGNOSIS — Z79899 Other long term (current) drug therapy: Secondary | ICD-10-CM | POA: Diagnosis not present

## 2022-05-22 LAB — RAD ONC ARIA SESSION SUMMARY
Course Elapsed Days: 45
Plan Fractions Treated to Date: 33
Plan Prescribed Dose Per Fraction: 2 Gy
Plan Total Fractions Prescribed: 33
Plan Total Prescribed Dose: 66 Gy
Reference Point Dosage Given to Date: 66 Gy
Reference Point Session Dosage Given: 2 Gy
Session Number: 33

## 2022-05-26 ENCOUNTER — Other Ambulatory Visit: Payer: Self-pay

## 2022-05-26 ENCOUNTER — Inpatient Hospital Stay: Payer: Medicare Other

## 2022-05-26 DIAGNOSIS — Z5111 Encounter for antineoplastic chemotherapy: Secondary | ICD-10-CM | POA: Diagnosis not present

## 2022-05-26 DIAGNOSIS — G8929 Other chronic pain: Secondary | ICD-10-CM | POA: Diagnosis not present

## 2022-05-26 DIAGNOSIS — I10 Essential (primary) hypertension: Secondary | ICD-10-CM | POA: Diagnosis not present

## 2022-05-26 DIAGNOSIS — N200 Calculus of kidney: Secondary | ICD-10-CM | POA: Diagnosis not present

## 2022-05-26 DIAGNOSIS — Z7982 Long term (current) use of aspirin: Secondary | ICD-10-CM | POA: Diagnosis not present

## 2022-05-26 DIAGNOSIS — Z79899 Other long term (current) drug therapy: Secondary | ICD-10-CM | POA: Diagnosis not present

## 2022-05-26 DIAGNOSIS — J432 Centrilobular emphysema: Secondary | ICD-10-CM | POA: Diagnosis not present

## 2022-05-26 DIAGNOSIS — C3492 Malignant neoplasm of unspecified part of left bronchus or lung: Secondary | ICD-10-CM

## 2022-05-26 DIAGNOSIS — I2510 Atherosclerotic heart disease of native coronary artery without angina pectoris: Secondary | ICD-10-CM | POA: Diagnosis not present

## 2022-05-26 DIAGNOSIS — E785 Hyperlipidemia, unspecified: Secondary | ICD-10-CM | POA: Diagnosis not present

## 2022-05-26 DIAGNOSIS — C3412 Malignant neoplasm of upper lobe, left bronchus or lung: Secondary | ICD-10-CM | POA: Diagnosis not present

## 2022-05-26 LAB — CBC WITH DIFFERENTIAL (CANCER CENTER ONLY)
Abs Immature Granulocytes: 0.03 10*3/uL (ref 0.00–0.07)
Basophils Absolute: 0 10*3/uL (ref 0.0–0.1)
Basophils Relative: 1 %
Eosinophils Absolute: 0 10*3/uL (ref 0.0–0.5)
Eosinophils Relative: 0 %
HCT: 26.5 % — ABNORMAL LOW (ref 39.0–52.0)
Hemoglobin: 9.2 g/dL — ABNORMAL LOW (ref 13.0–17.0)
Immature Granulocytes: 1 %
Lymphocytes Relative: 12 %
Lymphs Abs: 0.3 10*3/uL — ABNORMAL LOW (ref 0.7–4.0)
MCH: 31.5 pg (ref 26.0–34.0)
MCHC: 34.7 g/dL (ref 30.0–36.0)
MCV: 90.8 fL (ref 80.0–100.0)
Monocytes Absolute: 0.1 10*3/uL (ref 0.1–1.0)
Monocytes Relative: 2 %
Neutro Abs: 2.5 10*3/uL (ref 1.7–7.7)
Neutrophils Relative %: 84 %
Platelet Count: 304 10*3/uL (ref 150–400)
RBC: 2.92 MIL/uL — ABNORMAL LOW (ref 4.22–5.81)
RDW: 14 % (ref 11.5–15.5)
WBC Count: 3 10*3/uL — ABNORMAL LOW (ref 4.0–10.5)
nRBC: 0 % (ref 0.0–0.2)

## 2022-05-26 LAB — CMP (CANCER CENTER ONLY)
ALT: 21 U/L (ref 0–44)
AST: 18 U/L (ref 15–41)
Albumin: 3.8 g/dL (ref 3.5–5.0)
Alkaline Phosphatase: 76 U/L (ref 38–126)
Anion gap: 5 (ref 5–15)
BUN: 14 mg/dL (ref 8–23)
CO2: 28 mmol/L (ref 22–32)
Calcium: 9.2 mg/dL (ref 8.9–10.3)
Chloride: 104 mmol/L (ref 98–111)
Creatinine: 0.9 mg/dL (ref 0.61–1.24)
GFR, Estimated: >60 mL/min (ref >60)
Glucose, Bld: 116 mg/dL — ABNORMAL HIGH (ref 70–99)
Potassium: 4 mmol/L (ref 3.5–5.1)
Sodium: 137 mmol/L (ref 135–145)
Total Bilirubin: 0.4 mg/dL (ref 0.3–1.2)
Total Protein: 6.7 g/dL (ref 6.5–8.1)

## 2022-05-26 LAB — MAGNESIUM: Magnesium: 1.5 mg/dL — ABNORMAL LOW (ref 1.7–2.4)

## 2022-06-01 ENCOUNTER — Encounter: Payer: Self-pay | Admitting: Urology

## 2022-06-02 ENCOUNTER — Inpatient Hospital Stay: Payer: Medicare Other

## 2022-06-02 DIAGNOSIS — E785 Hyperlipidemia, unspecified: Secondary | ICD-10-CM | POA: Diagnosis not present

## 2022-06-02 DIAGNOSIS — G8929 Other chronic pain: Secondary | ICD-10-CM | POA: Diagnosis not present

## 2022-06-02 DIAGNOSIS — Z79899 Other long term (current) drug therapy: Secondary | ICD-10-CM | POA: Diagnosis not present

## 2022-06-02 DIAGNOSIS — I10 Essential (primary) hypertension: Secondary | ICD-10-CM | POA: Diagnosis not present

## 2022-06-02 DIAGNOSIS — C3412 Malignant neoplasm of upper lobe, left bronchus or lung: Secondary | ICD-10-CM | POA: Diagnosis not present

## 2022-06-02 DIAGNOSIS — Z5111 Encounter for antineoplastic chemotherapy: Secondary | ICD-10-CM | POA: Diagnosis not present

## 2022-06-02 DIAGNOSIS — J432 Centrilobular emphysema: Secondary | ICD-10-CM | POA: Diagnosis not present

## 2022-06-02 DIAGNOSIS — Z7982 Long term (current) use of aspirin: Secondary | ICD-10-CM | POA: Diagnosis not present

## 2022-06-02 DIAGNOSIS — I2510 Atherosclerotic heart disease of native coronary artery without angina pectoris: Secondary | ICD-10-CM | POA: Diagnosis not present

## 2022-06-02 DIAGNOSIS — N200 Calculus of kidney: Secondary | ICD-10-CM | POA: Diagnosis not present

## 2022-06-02 DIAGNOSIS — C3492 Malignant neoplasm of unspecified part of left bronchus or lung: Secondary | ICD-10-CM

## 2022-06-02 LAB — CMP (CANCER CENTER ONLY)
ALT: 21 U/L (ref 0–44)
AST: 14 U/L — ABNORMAL LOW (ref 15–41)
Albumin: 3.9 g/dL (ref 3.5–5.0)
Alkaline Phosphatase: 79 U/L (ref 38–126)
Anion gap: 6 (ref 5–15)
BUN: 13 mg/dL (ref 8–23)
CO2: 28 mmol/L (ref 22–32)
Calcium: 9 mg/dL (ref 8.9–10.3)
Chloride: 104 mmol/L (ref 98–111)
Creatinine: 0.99 mg/dL (ref 0.61–1.24)
GFR, Estimated: >60 mL/min (ref >60)
Glucose, Bld: 108 mg/dL — ABNORMAL HIGH (ref 70–99)
Potassium: 3.8 mmol/L (ref 3.5–5.1)
Sodium: 138 mmol/L (ref 135–145)
Total Bilirubin: 0.2 mg/dL — ABNORMAL LOW (ref 0.3–1.2)
Total Protein: 6.9 g/dL (ref 6.5–8.1)

## 2022-06-02 LAB — CBC WITH DIFFERENTIAL (CANCER CENTER ONLY)
Abs Immature Granulocytes: 0.01 10*3/uL (ref 0.00–0.07)
Basophils Absolute: 0 10*3/uL (ref 0.0–0.1)
Basophils Relative: 1 %
Eosinophils Absolute: 0 10*3/uL (ref 0.0–0.5)
Eosinophils Relative: 2 %
HCT: 25.5 % — ABNORMAL LOW (ref 39.0–52.0)
Hemoglobin: 8.9 g/dL — ABNORMAL LOW (ref 13.0–17.0)
Immature Granulocytes: 1 %
Lymphocytes Relative: 25 %
Lymphs Abs: 0.5 10*3/uL — ABNORMAL LOW (ref 0.7–4.0)
MCH: 31.3 pg (ref 26.0–34.0)
MCHC: 34.9 g/dL (ref 30.0–36.0)
MCV: 89.8 fL (ref 80.0–100.0)
Monocytes Absolute: 0.3 10*3/uL (ref 0.1–1.0)
Monocytes Relative: 12 %
Neutro Abs: 1.2 10*3/uL — ABNORMAL LOW (ref 1.7–7.7)
Neutrophils Relative %: 59 %
Platelet Count: 44 10*3/uL — ABNORMAL LOW (ref 150–400)
RBC: 2.84 MIL/uL — ABNORMAL LOW (ref 4.22–5.81)
RDW: 13.4 % (ref 11.5–15.5)
Smear Review: NORMAL
WBC Count: 2.1 10*3/uL — ABNORMAL LOW (ref 4.0–10.5)
nRBC: 0 % (ref 0.0–0.2)

## 2022-06-02 LAB — MAGNESIUM: Magnesium: 1.7 mg/dL (ref 1.7–2.4)

## 2022-06-03 DIAGNOSIS — Z9221 Personal history of antineoplastic chemotherapy: Secondary | ICD-10-CM

## 2022-06-03 HISTORY — DX: Personal history of antineoplastic chemotherapy: Z92.21

## 2022-06-06 MED FILL — Fosaprepitant Dimeglumine For IV Infusion 150 MG (Base Eq): INTRAVENOUS | Qty: 5 | Status: AC

## 2022-06-09 ENCOUNTER — Other Ambulatory Visit: Payer: Medicare Other

## 2022-06-09 ENCOUNTER — Inpatient Hospital Stay: Payer: Medicare Other

## 2022-06-09 ENCOUNTER — Inpatient Hospital Stay: Payer: Medicare Other | Attending: Internal Medicine | Admitting: Internal Medicine

## 2022-06-09 ENCOUNTER — Other Ambulatory Visit: Payer: Self-pay

## 2022-06-09 VITALS — BP 123/64 | HR 84 | Temp 97.8°F | Resp 16 | Wt 199.1 lb

## 2022-06-09 VITALS — BP 121/72 | HR 74 | Temp 97.8°F | Resp 17

## 2022-06-09 DIAGNOSIS — I10 Essential (primary) hypertension: Secondary | ICD-10-CM | POA: Diagnosis not present

## 2022-06-09 DIAGNOSIS — G8929 Other chronic pain: Secondary | ICD-10-CM | POA: Insufficient documentation

## 2022-06-09 DIAGNOSIS — K219 Gastro-esophageal reflux disease without esophagitis: Secondary | ICD-10-CM | POA: Diagnosis not present

## 2022-06-09 DIAGNOSIS — F419 Anxiety disorder, unspecified: Secondary | ICD-10-CM | POA: Insufficient documentation

## 2022-06-09 DIAGNOSIS — C3492 Malignant neoplasm of unspecified part of left bronchus or lung: Secondary | ICD-10-CM

## 2022-06-09 DIAGNOSIS — J432 Centrilobular emphysema: Secondary | ICD-10-CM | POA: Insufficient documentation

## 2022-06-09 DIAGNOSIS — E785 Hyperlipidemia, unspecified: Secondary | ICD-10-CM | POA: Insufficient documentation

## 2022-06-09 DIAGNOSIS — N200 Calculus of kidney: Secondary | ICD-10-CM | POA: Diagnosis not present

## 2022-06-09 DIAGNOSIS — Z79899 Other long term (current) drug therapy: Secondary | ICD-10-CM | POA: Diagnosis not present

## 2022-06-09 DIAGNOSIS — E7800 Pure hypercholesterolemia, unspecified: Secondary | ICD-10-CM | POA: Diagnosis not present

## 2022-06-09 DIAGNOSIS — Z5111 Encounter for antineoplastic chemotherapy: Secondary | ICD-10-CM | POA: Diagnosis not present

## 2022-06-09 DIAGNOSIS — C3490 Malignant neoplasm of unspecified part of unspecified bronchus or lung: Secondary | ICD-10-CM

## 2022-06-09 DIAGNOSIS — Z7982 Long term (current) use of aspirin: Secondary | ICD-10-CM | POA: Insufficient documentation

## 2022-06-09 DIAGNOSIS — E1169 Type 2 diabetes mellitus with other specified complication: Secondary | ICD-10-CM | POA: Diagnosis not present

## 2022-06-09 DIAGNOSIS — C3412 Malignant neoplasm of upper lobe, left bronchus or lung: Secondary | ICD-10-CM | POA: Insufficient documentation

## 2022-06-09 DIAGNOSIS — I2510 Atherosclerotic heart disease of native coronary artery without angina pectoris: Secondary | ICD-10-CM | POA: Insufficient documentation

## 2022-06-09 LAB — CBC WITH DIFFERENTIAL (CANCER CENTER ONLY)
Abs Immature Granulocytes: 0.09 10*3/uL — ABNORMAL HIGH (ref 0.00–0.07)
Basophils Absolute: 0 10*3/uL (ref 0.0–0.1)
Basophils Relative: 1 %
Eosinophils Absolute: 0 10*3/uL (ref 0.0–0.5)
Eosinophils Relative: 1 %
HCT: 26.8 % — ABNORMAL LOW (ref 39.0–52.0)
Hemoglobin: 9.2 g/dL — ABNORMAL LOW (ref 13.0–17.0)
Immature Granulocytes: 4 %
Lymphocytes Relative: 24 %
Lymphs Abs: 0.6 10*3/uL — ABNORMAL LOW (ref 0.7–4.0)
MCH: 31.4 pg (ref 26.0–34.0)
MCHC: 34.3 g/dL (ref 30.0–36.0)
MCV: 91.5 fL (ref 80.0–100.0)
Monocytes Absolute: 0.5 10*3/uL (ref 0.1–1.0)
Monocytes Relative: 21 %
Neutro Abs: 1.3 10*3/uL — ABNORMAL LOW (ref 1.7–7.7)
Neutrophils Relative %: 49 %
Platelet Count: 218 10*3/uL (ref 150–400)
RBC: 2.93 MIL/uL — ABNORMAL LOW (ref 4.22–5.81)
RDW: 15.4 % (ref 11.5–15.5)
WBC Count: 2.6 10*3/uL — ABNORMAL LOW (ref 4.0–10.5)
nRBC: 0 % (ref 0.0–0.2)

## 2022-06-09 LAB — CMP (CANCER CENTER ONLY)
ALT: 17 U/L (ref 0–44)
AST: 13 U/L — ABNORMAL LOW (ref 15–41)
Albumin: 3.8 g/dL (ref 3.5–5.0)
Alkaline Phosphatase: 77 U/L (ref 38–126)
Anion gap: 7 (ref 5–15)
BUN: 9 mg/dL (ref 8–23)
CO2: 26 mmol/L (ref 22–32)
Calcium: 8.8 mg/dL — ABNORMAL LOW (ref 8.9–10.3)
Chloride: 106 mmol/L (ref 98–111)
Creatinine: 0.91 mg/dL (ref 0.61–1.24)
GFR, Estimated: >60 mL/min (ref >60)
Glucose, Bld: 138 mg/dL — ABNORMAL HIGH (ref 70–99)
Potassium: 3.5 mmol/L (ref 3.5–5.1)
Sodium: 139 mmol/L (ref 135–145)
Total Bilirubin: 0.2 mg/dL — ABNORMAL LOW (ref 0.3–1.2)
Total Protein: 6.6 g/dL (ref 6.5–8.1)

## 2022-06-09 LAB — MAGNESIUM: Magnesium: 1.6 mg/dL — ABNORMAL LOW (ref 1.7–2.4)

## 2022-06-09 MED ORDER — PALONOSETRON HCL INJECTION 0.25 MG/5ML
0.2500 mg | Freq: Once | INTRAVENOUS | Status: AC
  Administered 2022-06-09: 0.25 mg via INTRAVENOUS
  Filled 2022-06-09: qty 5

## 2022-06-09 MED ORDER — SODIUM CHLORIDE 0.9 % IV SOLN
60.0000 mg/m2 | Freq: Once | INTRAVENOUS | Status: AC
  Administered 2022-06-09: 129 mg via INTRAVENOUS
  Filled 2022-06-09: qty 129

## 2022-06-09 MED ORDER — SODIUM CHLORIDE 0.9 % IV SOLN
100.0000 mg/m2 | Freq: Once | INTRAVENOUS | Status: AC
  Administered 2022-06-09: 220 mg via INTRAVENOUS
  Filled 2022-06-09: qty 11

## 2022-06-09 MED ORDER — SODIUM CHLORIDE 0.9 % IV SOLN: Freq: Once | INTRAVENOUS | Status: AC

## 2022-06-09 MED ORDER — SODIUM CHLORIDE 0.9 % IV SOLN
150.0000 mg | Freq: Once | INTRAVENOUS | Status: AC
  Administered 2022-06-09: 150 mg via INTRAVENOUS
  Filled 2022-06-09: qty 150

## 2022-06-09 MED ORDER — POTASSIUM CHLORIDE IN NACL 20-0.9 MEQ/L-% IV SOLN
Freq: Once | INTRAVENOUS | Status: AC
  Filled 2022-06-09: qty 1000

## 2022-06-09 MED ORDER — MAGNESIUM SULFATE 2 GM/50ML IV SOLN
2.0000 g | Freq: Once | INTRAVENOUS | Status: AC
  Administered 2022-06-09: 2 g via INTRAVENOUS
  Filled 2022-06-09: qty 50

## 2022-06-09 NOTE — Progress Notes (Signed)
Per Dr. Julien Nordmann, it is ok to treat pt today with Cisplatin and etoposide and WBC = 2.6 and ANC of 1.3.

## 2022-06-09 NOTE — Progress Notes (Signed)
[facility] [phone]   Fax:([phone]) [phone]  OFFICE PROGRESS NOTE  [name], MD [place] [address]  DIAGNOSIS:  limited stage (T1c, N2, M0) small cell lung cancer with mixture of squamous cell carcinoma in the left upper lobe presented with left upper lobe lung nodule in addition to left hilar and mediastinal lymphadenopathy diagnosed in May 2023.  PRIOR THERAPY: None  CURRENT THERAPY:  systemic chemotherapy with cisplatin 75 Mg/M2 on day 1 and etoposide 100 Mg/M2 on days 1, 2 and 3 every 3 weeks for 4 cycles.  This will be concurrent with radiotherapy for around 6 weeks during this course of treatment.  Status post 3 cycles.  INTERVAL HISTORY: Francisco Austin 71 y.o. male returns to the clinic today for follow-up visit.  The patient continues to tolerate his treatment fairly well with no concerning adverse effect except for fatigue.  He completed his concurrent radiotherapy.  He denied having any chest pain but continues to have shortness of breath with exertion with mild cough and no hemoptysis.  He has no nausea, vomiting, diarrhea or constipation.  He has no headache or visual changes.  He is here today for evaluation before starting cycle #4 of his treatment.  MEDICAL HISTORY: Past Medical History:  Diagnosis Date   Anxiety    Back pain    CAD (coronary artery disease)    COPD (chronic obstructive pulmonary disease) (HCC)    History of kidney stones    HLD (hyperlipidemia)    HTN (hypertension)    Pre-diabetes     ALLERGIES:  is allergic to gabapentin, morphine, other, prednisone, alprazolam, fentanyl, niacin, and penicillamine.  MEDICATIONS:  Current Outpatient Medications  Medication Sig Dispense Refill   albuterol (VENTOLIN HFA) 108 (90 Base) MCG/ACT inhaler Inhale 2 puffs into the lungs every 6 (six) hours as needed for wheezing or shortness of breath.     aspirin EC 81 MG tablet Take 81 mg by mouth  daily.     atorvastatin (LIPITOR) 80 MG tablet Take 1 tablet (80 mg total) by mouth daily. 90 tablet 1   Cholecalciferol 50 MCG (2000 UT) TABS Take 2,000 Units by mouth daily.     clopidogrel (PLAVIX) 75 MG tablet Take 75 mg by mouth daily.     diclofenac sodium (VOLTAREN) 1 % GEL Apply 1 g topically daily as needed (pain).     diltiazem (TIAZAC) 240 MG 24 hr capsule Take 240 mg by mouth daily.     esomeprazole (NEXIUM) 40 MG capsule Take 40 mg by mouth daily.     guaiFENesin (ROBITUSSIN) 100 MG/5ML liquid Take 10 mLs by mouth every 4 (four) hours as needed for cough or to loosen phlegm. 473 mL 5   LORazepam (ATIVAN) 1 MG tablet Take 1 mg by mouth 3 (three) times daily.     nitroGLYCERIN (NITROSTAT) 0.4 MG SL tablet Place 0.4 mg under the tongue every 5 (five) minutes as needed for chest pain.     oxyCODONE (OXY IR/ROXICODONE) 5 MG immediate release tablet Take 10 mg by mouth every 6 (six) hours as needed for severe pain (For 30 days). Verified with [facility].     prochlorperazine (COMPAZINE) 10 MG tablet Take 1 tablet (10 mg total) by mouth every 6 (six) hours as needed. 30 tablet 2   sucralfate (CARAFATE) 1 g tablet Take 1 tablet (1 g total) by mouth 4 (four) times daily -  with meals  and at bedtime. 5 min before meals for radiation induced esophagitis 120 tablet 2   telmisartan (MICARDIS) 80 MG tablet Take 80 mg by mouth daily.     umeclidinium-vilanterol (ANORO ELLIPTA) 62.5-25 MCG/INH AEPB Inhale 1 puff into the lungs daily.     vitamin B-12 (CYANOCOBALAMIN) 500 MCG tablet Take 500 mcg by mouth daily.     No current facility-administered medications for this visit.    SURGICAL HISTORY:  Past Surgical History:  Procedure Laterality Date   APPENDECTOMY     CORONARY STENT INTERVENTION N/A 12/24/2021   Procedure: CORONARY STENT INTERVENTION;  Surgeon: Jettie Booze, MD;  Location: [facility];  Service: Cardiovascular;  Laterality: N/A;   HEMORROIDECTOMY      INTRAVASCULAR ULTRASOUND/IVUS N/A 12/24/2021   Procedure: Intravascular Ultrasound/IVUS;  Surgeon: Jettie Booze, MD;  Location: [facility];  Service: Cardiovascular;  Laterality: N/A;   LEFT HEART CATH AND CORONARY ANGIOGRAPHY N/A 12/23/2021   Procedure: LEFT HEART CATH AND CORONARY ANGIOGRAPHY;  Surgeon: Lorretta Harp, MD;  Location: [facility];  Service: Cardiovascular;  Laterality: N/A;   TONSILLECTOMY     VIDEO BRONCHOSCOPY WITH ENDOBRONCHIAL ULTRASOUND N/A 03/12/2022   Procedure: VIDEO BRONCHOSCOPY WITH ENDOBRONCHIAL ULTRASOUND;  Surgeon: Melrose Nakayama, MD;  Location: [facility];  Service: Thoracic;  Laterality: N/A;    REVIEW OF SYSTEMS:  A comprehensive review of systems was negative except for: Constitutional: positive for fatigue   PHYSICAL EXAMINATION: General appearance: alert, cooperative, fatigued, and no distress Head: Normocephalic, without obvious abnormality, atraumatic Neck: no adenopathy, no JVD, supple, symmetrical, trachea midline, and thyroid not enlarged, symmetric, no tenderness/mass/nodules Lymph nodes: Cervical, supraclavicular, and axillary nodes normal. Resp: clear to auscultation bilaterally Back: symmetric, no curvature. ROM normal. No CVA tenderness. Cardio: regular rate and rhythm, S1, S2 normal, no murmur, click, rub or gallop GI: soft, non-tender; bowel sounds normal; no masses,  no organomegaly Extremities: extremities normal, atraumatic, no cyanosis or edema  ECOG PERFORMANCE STATUS: 1 - Symptomatic but completely ambulatory  Blood pressure 123/64, pulse 84, temperature 97.8 F (36.6 C), temperature source Oral, resp. rate 16, weight 199 lb 2 oz (90.3 kg), SpO2 96 %.  LABORATORY DATA: Lab Results  Component Value Date   WBC 2.1 (L) 06/02/2022   HGB 8.9 (L) 06/02/2022   HCT 25.5 (L) 06/02/2022   MCV 89.8 06/02/2022   PLT 44 (L) 06/02/2022      Chemistry      Component Value Date/Time   NA 138 06/02/2022 1224   NA  140 12/13/2021 0900   K 3.8 06/02/2022 1224   CL 104 06/02/2022 1224   CO2 28 06/02/2022 1224   BUN 13 06/02/2022 1224   BUN 9 12/13/2021 0900   CREATININE 0.99 06/02/2022 1224      Component Value Date/Time   CALCIUM 9.0 06/02/2022 1224   ALKPHOS 79 06/02/2022 1224   AST 14 (L) 06/02/2022 1224   ALT 21 06/02/2022 1224   BILITOT 0.2 (L) 06/02/2022 1224       RADIOGRAPHIC STUDIES: No results found.  ASSESSMENT AND PLAN: This is a very pleasant 71 years old white male diagnosed with limited stage (T1c, N2, M0) small cell lung cancer with mixture of squamous cell carcinoma in the left upper lobe presented with left upper lobe lung nodule in addition to left hilar and mediastinal lymphadenopathy diagnosed in May 2023.  The patient is currently undergoing systemic chemotherapy with cisplatin 75 Mg/M2 on day 1 and etoposide  100 Mg/M2 on days 1, 2 and 3 status post 3 cycles.  This is concurrent with radiotherapy. The patient has been tolerating this treatment well with no concerning adverse effect except for fatigue. I recommended for him to proceed with cycle #4 today as planned but I will reduce the dose of cisplatin to 60 Mg/M2 for the last cycle of his treatment because of the pancytopenia. I will see him back for follow-up visit in 3 weeks for evaluation with repeat CT scan of the chest for restaging of his disease. The patient was advised to call immediately if he has any concerning symptoms in the interval. The patient voices understanding of current disease status and treatment options and is in agreement with the current care plan.  All questions were answered. The patient knows to call the clinic with any problems, questions or concerns. We can certainly see the patient much sooner if necessary.  The total time spent in the appointment was 20 minutes.  Disclaimer: This note was dictated with voice recognition software. Similar sounding words can inadvertently be transcribed and may  not be corrected upon review.

## 2022-06-09 NOTE — Patient Instructions (Signed)
[facility]  Discharge Instructions: Thank you for choosing [facility] to provide your oncology and hematology care.   If you have a lab appointment with the [facility], please go directly to the [facility] and check in at the registration area.   Wear comfortable clothing and clothing appropriate for easy access to any Portacath or PICC line.   We strive to give you quality time with your provider. You may need to reschedule your appointment if you arrive late (15 or more minutes).  Arriving late affects you and other patients whose appointments are after yours.  Also, if you miss three or more appointments without notifying the office, you may be dismissed from the clinic at the provider's discretion.      For prescription refill requests, have your pharmacy contact our office and allow 72 hours for refills to be completed.    Today you received the following chemotherapy and/or immunotherapy agents: Cisplatin and Etoposide      To help prevent nausea and vomiting after your treatment, we encourage you to take your nausea medication as directed.  BELOW ARE SYMPTOMS THAT SHOULD BE REPORTED IMMEDIATELY: *FEVER GREATER THAN 100.4 F (38 C) OR HIGHER *CHILLS OR SWEATING *NAUSEA AND VOMITING THAT IS NOT CONTROLLED WITH YOUR NAUSEA MEDICATION *UNUSUAL SHORTNESS OF BREATH *UNUSUAL BRUISING OR BLEEDING *URINARY PROBLEMS (pain or burning when urinating, or frequent urination) *BOWEL PROBLEMS (unusual diarrhea, constipation, pain near the anus) TENDERNESS IN MOUTH AND THROAT WITH OR WITHOUT PRESENCE OF ULCERS (sore throat, sores in mouth, or a toothache) UNUSUAL RASH, SWELLING OR PAIN  UNUSUAL VAGINAL DISCHARGE OR ITCHING   Items with * indicate a potential emergency and should be followed up as soon as possible or go to the Emergency Department if any problems should occur.  Please show the CHEMOTHERAPY ALERT CARD or IMMUNOTHERAPY ALERT CARD  at check-in to the Emergency Department and triage nurse.  Should you have questions after your visit or need to cancel or reschedule your appointment, please contact [facility]  Dept: [phone]  and follow the prompts.  Office hours are 8:00 a.m. to 4:30 p.m. Monday - Friday. Please note that voicemails left after 4:00 p.m. may not be returned until the following business day.  We are closed weekends and major holidays. You have access to a nurse at all times for urgent questions. Please call the main number to the clinic Dept: [phone] and follow the prompts.   For any non-urgent questions, you may also contact your provider using MyChart. We now offer e-Visits for anyone 72 and older to request care online for non-urgent symptoms. For details visit [email or web address].   Also download the MyChart app! Go to the app store, search "MyChart", open the app, select [facility], and log in with your MyChart username and password.  Masks are optional in the cancer centers. If you would like for your care team to wear a mask while they are taking care of you, please let them know. You may have one support person who is at least 71 years old accompany you for your appointments.

## 2022-06-10 ENCOUNTER — Inpatient Hospital Stay: Payer: Medicare Other

## 2022-06-10 VITALS — BP 122/61 | HR 84 | Temp 97.5°F | Resp 16

## 2022-06-10 DIAGNOSIS — C3412 Malignant neoplasm of upper lobe, left bronchus or lung: Secondary | ICD-10-CM | POA: Diagnosis not present

## 2022-06-10 DIAGNOSIS — Z79899 Other long term (current) drug therapy: Secondary | ICD-10-CM | POA: Diagnosis not present

## 2022-06-10 DIAGNOSIS — I2510 Atherosclerotic heart disease of native coronary artery without angina pectoris: Secondary | ICD-10-CM | POA: Diagnosis not present

## 2022-06-10 DIAGNOSIS — I10 Essential (primary) hypertension: Secondary | ICD-10-CM | POA: Diagnosis not present

## 2022-06-10 DIAGNOSIS — G8929 Other chronic pain: Secondary | ICD-10-CM | POA: Diagnosis not present

## 2022-06-10 DIAGNOSIS — N200 Calculus of kidney: Secondary | ICD-10-CM | POA: Diagnosis not present

## 2022-06-10 DIAGNOSIS — Z5111 Encounter for antineoplastic chemotherapy: Secondary | ICD-10-CM | POA: Diagnosis not present

## 2022-06-10 DIAGNOSIS — Z7982 Long term (current) use of aspirin: Secondary | ICD-10-CM | POA: Diagnosis not present

## 2022-06-10 DIAGNOSIS — J432 Centrilobular emphysema: Secondary | ICD-10-CM | POA: Diagnosis not present

## 2022-06-10 DIAGNOSIS — C3492 Malignant neoplasm of unspecified part of left bronchus or lung: Secondary | ICD-10-CM

## 2022-06-10 DIAGNOSIS — E785 Hyperlipidemia, unspecified: Secondary | ICD-10-CM | POA: Diagnosis not present

## 2022-06-10 MED ORDER — SODIUM CHLORIDE 0.9 % IV SOLN
100.0000 mg/m2 | Freq: Once | INTRAVENOUS | Status: AC
  Administered 2022-06-10: 220 mg via INTRAVENOUS
  Filled 2022-06-10: qty 11

## 2022-06-10 MED ORDER — PROCHLORPERAZINE EDISYLATE 10 MG/2ML IJ SOLN
10.0000 mg | Freq: Once | INTRAMUSCULAR | Status: AC
  Administered 2022-06-10: 10 mg via INTRAVENOUS
  Filled 2022-06-10: qty 2

## 2022-06-10 MED ORDER — SODIUM CHLORIDE 0.9 % IV SOLN: Freq: Once | INTRAVENOUS | Status: AC

## 2022-06-10 NOTE — Patient Instructions (Signed)
[facility]  Discharge Instructions: Thank you for choosing [facility] to provide your oncology and hematology care.   If you have a lab appointment with the [facility], please go directly to the [facility] and check in at the registration area.   Wear comfortable clothing and clothing appropriate for easy access to any Portacath or PICC line.   We strive to give you quality time with your provider. You may need to reschedule your appointment if you arrive late (15 or more minutes).  Arriving late affects you and other patients whose appointments are after yours.  Also, if you miss three or more appointments without notifying the office, you may be dismissed from the clinic at the provider's discretion.      For prescription refill requests, have your pharmacy contact our office and allow 72 hours for refills to be completed.    Today you received the following chemotherapy and/or immunotherapy agents: Etoposide      To help prevent nausea and vomiting after your treatment, we encourage you to take your nausea medication as directed.  BELOW ARE SYMPTOMS THAT SHOULD BE REPORTED IMMEDIATELY: *FEVER GREATER THAN 100.4 F (38 C) OR HIGHER *CHILLS OR SWEATING *NAUSEA AND VOMITING THAT IS NOT CONTROLLED WITH YOUR NAUSEA MEDICATION *UNUSUAL SHORTNESS OF BREATH *UNUSUAL BRUISING OR BLEEDING *URINARY PROBLEMS (pain or burning when urinating, or frequent urination) *BOWEL PROBLEMS (unusual diarrhea, constipation, pain near the anus) TENDERNESS IN MOUTH AND THROAT WITH OR WITHOUT PRESENCE OF ULCERS (sore throat, sores in mouth, or a toothache) UNUSUAL RASH, SWELLING OR PAIN  UNUSUAL VAGINAL DISCHARGE OR ITCHING   Items with * indicate a potential emergency and should be followed up as soon as possible or go to the Emergency Department if any problems should occur.  Please show the CHEMOTHERAPY ALERT CARD or IMMUNOTHERAPY ALERT CARD at check-in to  the Emergency Department and triage nurse.  Should you have questions after your visit or need to cancel or reschedule your appointment, please contact [facility]  Dept: [phone]  and follow the prompts.  Office hours are 8:00 a.m. to 4:30 p.m. Monday - Friday. Please note that voicemails left after 4:00 p.m. may not be returned until the following business day.  We are closed weekends and major holidays. You have access to a nurse at all times for urgent questions. Please call the main number to the clinic Dept: [phone] and follow the prompts.   For any non-urgent questions, you may also contact your provider using MyChart. We now offer e-Visits for anyone 20 and older to request care online for non-urgent symptoms. For details visit [email or web address].   Also download the MyChart app! Go to the app store, search "MyChart", open the app, select , and log in with your MyChart username and password.  Masks are optional in the cancer centers. If you would like for your care team to wear a mask while they are taking care of you, please let them know. For doctor visits, patients may have with them one support person who is at least 71 years old. At this time, visitors are not allowed in the infusion area.

## 2022-06-11 ENCOUNTER — Other Ambulatory Visit: Payer: Self-pay

## 2022-06-11 ENCOUNTER — Inpatient Hospital Stay: Payer: Medicare Other

## 2022-06-11 VITALS — BP 122/68 | HR 83 | Temp 97.7°F | Resp 17

## 2022-06-11 DIAGNOSIS — Z7982 Long term (current) use of aspirin: Secondary | ICD-10-CM | POA: Diagnosis not present

## 2022-06-11 DIAGNOSIS — N200 Calculus of kidney: Secondary | ICD-10-CM | POA: Diagnosis not present

## 2022-06-11 DIAGNOSIS — E785 Hyperlipidemia, unspecified: Secondary | ICD-10-CM | POA: Diagnosis not present

## 2022-06-11 DIAGNOSIS — I10 Essential (primary) hypertension: Secondary | ICD-10-CM | POA: Diagnosis not present

## 2022-06-11 DIAGNOSIS — I2510 Atherosclerotic heart disease of native coronary artery without angina pectoris: Secondary | ICD-10-CM | POA: Diagnosis not present

## 2022-06-11 DIAGNOSIS — C3412 Malignant neoplasm of upper lobe, left bronchus or lung: Secondary | ICD-10-CM | POA: Diagnosis not present

## 2022-06-11 DIAGNOSIS — J432 Centrilobular emphysema: Secondary | ICD-10-CM | POA: Diagnosis not present

## 2022-06-11 DIAGNOSIS — G8929 Other chronic pain: Secondary | ICD-10-CM | POA: Diagnosis not present

## 2022-06-11 DIAGNOSIS — Z79899 Other long term (current) drug therapy: Secondary | ICD-10-CM | POA: Diagnosis not present

## 2022-06-11 DIAGNOSIS — C3492 Malignant neoplasm of unspecified part of left bronchus or lung: Secondary | ICD-10-CM

## 2022-06-11 DIAGNOSIS — Z5111 Encounter for antineoplastic chemotherapy: Secondary | ICD-10-CM | POA: Diagnosis not present

## 2022-06-11 MED ORDER — PROCHLORPERAZINE EDISYLATE 10 MG/2ML IJ SOLN
10.0000 mg | Freq: Once | INTRAMUSCULAR | Status: AC
  Administered 2022-06-11: 10 mg via INTRAVENOUS

## 2022-06-11 MED ORDER — PROCHLORPERAZINE EDISYLATE 10 MG/2ML IJ SOLN
INTRAMUSCULAR | Status: AC
  Filled 2022-06-11: qty 2

## 2022-06-11 MED ORDER — SODIUM CHLORIDE 0.9 % IV SOLN
100.0000 mg/m2 | Freq: Once | INTRAVENOUS | Status: AC
  Administered 2022-06-11: 220 mg via INTRAVENOUS
  Filled 2022-06-11: qty 11

## 2022-06-11 MED ORDER — SODIUM CHLORIDE 0.9 % IV SOLN: Freq: Once | INTRAVENOUS | Status: AC

## 2022-06-11 NOTE — Patient Instructions (Signed)
[facility]  Discharge Instructions: Thank you for choosing [facility] to provide your oncology and hematology care.   If you have a lab appointment with the [facility], please go directly to the [facility] and check in at the registration area.   Wear comfortable clothing and clothing appropriate for easy access to any Portacath or PICC line.   We strive to give you quality time with your provider. You may need to reschedule your appointment if you arrive late (15 or more minutes).  Arriving late affects you and other patients whose appointments are after yours.  Also, if you miss three or more appointments without notifying the office, you may be dismissed from the clinic at the provider's discretion.      For prescription refill requests, have your pharmacy contact our office and allow 72 hours for refills to be completed.    Today you received the following chemotherapy and/or immunotherapy agent: Etoposide      To help prevent nausea and vomiting after your treatment, we encourage you to take your nausea medication as directed.  BELOW ARE SYMPTOMS THAT SHOULD BE REPORTED IMMEDIATELY: *FEVER GREATER THAN 100.4 F (38 C) OR HIGHER *CHILLS OR SWEATING *NAUSEA AND VOMITING THAT IS NOT CONTROLLED WITH YOUR NAUSEA MEDICATION *UNUSUAL SHORTNESS OF BREATH *UNUSUAL BRUISING OR BLEEDING *URINARY PROBLEMS (pain or burning when urinating, or frequent urination) *BOWEL PROBLEMS (unusual diarrhea, constipation, pain near the anus) TENDERNESS IN MOUTH AND THROAT WITH OR WITHOUT PRESENCE OF ULCERS (sore throat, sores in mouth, or a toothache) UNUSUAL RASH, SWELLING OR PAIN  UNUSUAL VAGINAL DISCHARGE OR ITCHING   Items with * indicate a potential emergency and should be followed up as soon as possible or go to the Emergency Department if any problems should occur.  Please show the CHEMOTHERAPY ALERT CARD or IMMUNOTHERAPY ALERT CARD at check-in to  the Emergency Department and triage nurse.  Should you have questions after your visit or need to cancel or reschedule your appointment, please contact [facility]  Dept: [phone]  and follow the prompts.  Office hours are 8:00 a.m. to 4:30 p.m. Monday - Friday. Please note that voicemails left after 4:00 p.m. may not be returned until the following business day.  We are closed weekends and major holidays. You have access to a nurse at all times for urgent questions. Please call the main number to the clinic Dept: [phone] and follow the prompts.   For any non-urgent questions, you may also contact your provider using MyChart. We now offer e-Visits for anyone 62 and older to request care online for non-urgent symptoms. For details visit [email or web address].   Also download the MyChart app! Go to the app store, search "MyChart", open the app, select [facility], and log in with your MyChart username and password.  Masks are optional in the cancer centers. If you would like for your care team to wear a mask while they are taking care of you, please let them know. You may have one support person who is at least 71 years old accompany you for your appointments.

## 2022-06-16 ENCOUNTER — Inpatient Hospital Stay: Payer: Medicare Other

## 2022-06-16 ENCOUNTER — Other Ambulatory Visit: Payer: Self-pay

## 2022-06-16 DIAGNOSIS — I10 Essential (primary) hypertension: Secondary | ICD-10-CM | POA: Diagnosis not present

## 2022-06-16 DIAGNOSIS — C3412 Malignant neoplasm of upper lobe, left bronchus or lung: Secondary | ICD-10-CM | POA: Diagnosis not present

## 2022-06-16 DIAGNOSIS — Z79899 Other long term (current) drug therapy: Secondary | ICD-10-CM | POA: Diagnosis not present

## 2022-06-16 DIAGNOSIS — C3492 Malignant neoplasm of unspecified part of left bronchus or lung: Secondary | ICD-10-CM

## 2022-06-16 DIAGNOSIS — E785 Hyperlipidemia, unspecified: Secondary | ICD-10-CM | POA: Diagnosis not present

## 2022-06-16 DIAGNOSIS — Z5111 Encounter for antineoplastic chemotherapy: Secondary | ICD-10-CM | POA: Diagnosis not present

## 2022-06-16 DIAGNOSIS — I2510 Atherosclerotic heart disease of native coronary artery without angina pectoris: Secondary | ICD-10-CM | POA: Diagnosis not present

## 2022-06-16 DIAGNOSIS — Z7982 Long term (current) use of aspirin: Secondary | ICD-10-CM | POA: Diagnosis not present

## 2022-06-16 DIAGNOSIS — G8929 Other chronic pain: Secondary | ICD-10-CM | POA: Diagnosis not present

## 2022-06-16 DIAGNOSIS — J432 Centrilobular emphysema: Secondary | ICD-10-CM | POA: Diagnosis not present

## 2022-06-16 DIAGNOSIS — N200 Calculus of kidney: Secondary | ICD-10-CM | POA: Diagnosis not present

## 2022-06-16 LAB — CBC WITH DIFFERENTIAL (CANCER CENTER ONLY)
Abs Immature Granulocytes: 0.04 10*3/uL (ref 0.00–0.07)
Basophils Absolute: 0 10*3/uL (ref 0.0–0.1)
Basophils Relative: 1 %
Eosinophils Absolute: 0 10*3/uL (ref 0.0–0.5)
Eosinophils Relative: 0 %
HCT: 23.3 % — ABNORMAL LOW (ref 39.0–52.0)
Hemoglobin: 8 g/dL — ABNORMAL LOW (ref 13.0–17.0)
Immature Granulocytes: 1 %
Lymphocytes Relative: 11 %
Lymphs Abs: 0.4 10*3/uL — ABNORMAL LOW (ref 0.7–4.0)
MCH: 31.1 pg (ref 26.0–34.0)
MCHC: 34.3 g/dL (ref 30.0–36.0)
MCV: 90.7 fL (ref 80.0–100.0)
Monocytes Absolute: 0.1 10*3/uL (ref 0.1–1.0)
Monocytes Relative: 2 %
Neutro Abs: 3.1 10*3/uL (ref 1.7–7.7)
Neutrophils Relative %: 85 %
Platelet Count: 204 10*3/uL (ref 150–400)
RBC: 2.57 MIL/uL — ABNORMAL LOW (ref 4.22–5.81)
RDW: 15.5 % (ref 11.5–15.5)
WBC Count: 3.6 10*3/uL — ABNORMAL LOW (ref 4.0–10.5)
nRBC: 0 % (ref 0.0–0.2)

## 2022-06-16 LAB — MAGNESIUM: Magnesium: 1.7 mg/dL (ref 1.7–2.4)

## 2022-06-16 LAB — CMP (CANCER CENTER ONLY)
ALT: 15 U/L (ref 0–44)
AST: 14 U/L — ABNORMAL LOW (ref 15–41)
Albumin: 4 g/dL (ref 3.5–5.0)
Alkaline Phosphatase: 74 U/L (ref 38–126)
Anion gap: 3 — ABNORMAL LOW (ref 5–15)
BUN: 19 mg/dL (ref 8–23)
CO2: 27 mmol/L (ref 22–32)
Calcium: 8.9 mg/dL (ref 8.9–10.3)
Chloride: 104 mmol/L (ref 98–111)
Creatinine: 1.11 mg/dL (ref 0.61–1.24)
GFR, Estimated: >60 mL/min (ref >60)
Glucose, Bld: 121 mg/dL — ABNORMAL HIGH (ref 70–99)
Potassium: 4.4 mmol/L (ref 3.5–5.1)
Sodium: 134 mmol/L — ABNORMAL LOW (ref 135–145)
Total Bilirubin: 0.3 mg/dL (ref 0.3–1.2)
Total Protein: 6.8 g/dL (ref 6.5–8.1)

## 2022-06-26 ENCOUNTER — Ambulatory Visit (HOSPITAL_COMMUNITY)
Admission: RE | Admit: 2022-06-26 | Discharge: 2022-06-26 | Disposition: A | Discharge status: Home / Self Care | Payer: Medicare Other | Source: Ambulatory Visit | Attending: Internal Medicine | Admitting: Internal Medicine

## 2022-06-26 DIAGNOSIS — C3490 Malignant neoplasm of unspecified part of unspecified bronchus or lung: Secondary | ICD-10-CM | POA: Diagnosis not present

## 2022-06-26 DIAGNOSIS — J439 Emphysema, unspecified: Secondary | ICD-10-CM | POA: Diagnosis not present

## 2022-06-26 DIAGNOSIS — I700 Atherosclerosis of aorta: Secondary | ICD-10-CM | POA: Diagnosis not present

## 2022-06-26 DIAGNOSIS — R911 Solitary pulmonary nodule: Secondary | ICD-10-CM | POA: Diagnosis not present

## 2022-06-26 MED ORDER — SODIUM CHLORIDE (PF) 0.9 % IJ SOLN
INTRAMUSCULAR | Status: AC
  Filled 2022-06-26: qty 50

## 2022-06-26 MED ORDER — IOHEXOL 300 MG/ML  SOLN
75.0000 mL | Freq: Once | INTRAMUSCULAR | Status: AC | PRN
  Administered 2022-06-26: 75 mL via INTRAVENOUS

## 2022-07-02 ENCOUNTER — Encounter: Payer: Self-pay | Admitting: Urology

## 2022-07-02 NOTE — Progress Notes (Signed)
  Radiation Oncology         [phone] ________________________________  Name: QUE MENEELY MRN: [id number]  Date: 05/22/2022  DOB: [birth year]   End of Treatment Note  Diagnosis:   71 yo male with NSCLC, squamous cell carcinoma involving the LLL lung and limited stage small cell carcinoma in an 11L node      Indication for treatment:  Curative, Chemo-Radiotherapy       Radiation treatment dates:   04/07/22 - 05/22/22  Site/dose:   The primary tumor and involved mediastinal adenopathy were treated to 66 Gy in 33 fractions of 2 Gy.  Beams/energy:   A five field 3D conformal treatment arrangement was used delivering 6 and 10 MV photons.  Daily image-guidance CT was used to align the treatment with the targeted volume  Narrative: The patient tolerated radiation treatment relatively well.  The patient experienced some esophagitis characterized as mild-moderate and managed with Carafate and Mylanta.  The patient also noted fatigue.  Plan: The patient has completed radiation treatment. The patient will return to radiation oncology clinic for routine followup in one month. I advised him to call or return sooner if he has any questions or concerns related to his recovery or treatment.  ________________________________  Sheral Apley. Tammi Klippel, M.D.

## 2022-07-02 NOTE — Progress Notes (Signed)
Telephone appointment. I verified patient's identity and began nursing interview. Patient reports fatigue, typical chest pain 5/10, that is managed w/ Nitroglycerin, improving SOB, and a mild cough. No other issues reported at this time.  Meaningful use complete.  Reminded patient of his 9:30am-07/03/22 telephone appointment w/ Ashlyn Bruning PA-C. I left my extension [phone] in case patient needs anything. Patient verbalized understanding.  Patient contact [phone]

## 2022-07-03 ENCOUNTER — Ambulatory Visit
Admission: RE | Admit: 2022-07-03 | Discharge: 2022-07-03 | Disposition: A | Discharge status: Home / Self Care | Payer: Medicare Other | Source: Ambulatory Visit | Attending: Urology | Admitting: Urology

## 2022-07-03 DIAGNOSIS — C3492 Malignant neoplasm of unspecified part of left bronchus or lung: Secondary | ICD-10-CM

## 2022-07-03 NOTE — Progress Notes (Signed)
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  Narrative:  I spoke with the patient to conduct his routine scheduled 1 month follow up visit via telephone to spare the patient unnecessary potential exposure in the healthcare setting during the current COVID-19 pandemic.  The patient was notified in advance and gave permission to proceed with this visit format.  He tolerated radiation treatment relatively well.  The patient experienced some esophagitis characterized as mild-moderate and managed with Carafate and Mylanta.  The patient also noted fatigue.                              On review of systems, the patient states that he is doing general.  He continues with a chronic, productive cough with clear to whitish sputum but denies any yellow/greenish coloration or hemoptysis.  He also denies any increase in shortness of breath, chest pain or dyspnea.  He has not had recent fevers, chills or night sweats.  He reports improvement in his appetite and he is maintaining his weight.  He continues with significant fatigue and mild dysphagia but feels like he continues to make gradual improvement.  Overall, he is pleased with his progress to date.  He had a posttreatment CT chest scan on 06/26/2022 which shows an excellent response to treatment with decreased size of thoracic lymphadenopathy and treated LLL lung lesion as well as a stable appearance of a 2 cm LUL lesion and no new nodules.  We reviewed these findings today.  ALLERGIES:   is allergic to gabapentin, morphine, other, prednisone, alprazolam, fentanyl, niacin, and penicillamine.  Meds: Current Outpatient Medications  Medication Sig Dispense Refill   albuterol (VENTOLIN HFA) 108 (90 Base) MCG/ACT inhaler Inhale 2 puffs into the lungs every 6 (six) hours as needed for wheezing or shortness of breath.     aspirin EC 81 MG tablet Take 81 mg by mouth daily.     atorvastatin (LIPITOR) 80 MG tablet Take 1 tablet (80 mg total) by mouth daily. 90 tablet 1   Cholecalciferol 50 MCG (2000 UT) TABS Take 2,000 Units by mouth daily.     clopidogrel (PLAVIX) 75 MG tablet Take 75 mg by mouth daily.     diclofenac sodium (VOLTAREN) 1 % GEL Apply 1 g topically daily as needed (pain).     diltiazem (TIAZAC) 240 MG 24 hr capsule Take 240 mg by mouth daily.     esomeprazole (NEXIUM) 40 MG capsule Take 40 mg by mouth daily.     guaiFENesin (ROBITUSSIN) 100 MG/5ML liquid Take 10 mLs by mouth every 4 (four) hours as needed for cough or to loosen phlegm. 473 mL 5   HYDROcodone-acetaminophen (NORCO) 10-325 MG tablet Take 1-2 tablets by mouth every 6 (six) hours as needed.     LORazepam (ATIVAN) 1 MG tablet Take 1 mg by mouth 3 (three) times daily.     nitroGLYCERIN (NITROSTAT) 0.4 MG  SL tablet Place 0.4 mg under the tongue every 5 (five) minutes as needed for chest pain.     prochlorperazine (COMPAZINE) 10 MG tablet Take 1 tablet (10 mg total) by mouth every 6 (six) hours as needed. 30 tablet 2   sucralfate (CARAFATE) 1 g tablet Take 1 tablet (1 g total) by mouth 4 (four) times daily -  with meals and at bedtime. 5 min before meals for radiation induced esophagitis 120 tablet 2   telmisartan (MICARDIS) 80 MG tablet Take 80 mg by mouth daily.     umeclidinium-vilanterol (ANORO ELLIPTA) 62.5-25 MCG/INH AEPB Inhale 1 puff into the lungs daily.     vitamin B-12 (CYANOCOBALAMIN) 500 MCG tablet Take 500 mcg by mouth daily.     No current facility-administered medications for this encounter.    Facility-Administered Medications Ordered in Other Encounters  Medication Dose Route Frequency Provider Last Rate Last Admin   prochlorperazine (COMPAZINE) 10 MG/2ML injection             Physical Findings:  vitals were not taken for this visit.  Pain Assessment Pain Score: 5  (Chest pain)/10 Unable to assess due to telephone follow-up visit format.  Lab Findings: Lab Results  Component Value Date   WBC 3.6 (L) 06/16/2022   HGB 8.0 (L) 06/16/2022   HCT 23.3 (L) 06/16/2022   MCV 90.7 06/16/2022   PLT 204 06/16/2022     Radiographic Findings: CT Chest W Contrast  Result Date: 06/27/2022 CLINICAL DATA:  Small cell lung cancer status post chemotherapy and radiation therapy. Restaging. * Tracking Code: BO * EXAM: CT CHEST WITH CONTRAST TECHNIQUE: Multidetector CT imaging of the chest was performed during intravenous contrast administration. RADIATION DOSE REDUCTION: This exam was performed according to the departmental dose-optimization program which includes automated exposure control, adjustment of the mA and/or kV according to patient size and/or use of iterative reconstruction technique. CONTRAST:  83mL OMNIPAQUE IOHEXOL 300 MG/ML  SOLN COMPARISON:  01/29/2022 chest CT.  02/26/2022 PET-CT. FINDINGS: Cardiovascular: Normal heart size. Stable lipomatous hypertrophy of the interatrial septum. No significant pericardial effusion/thickening. Three-vessel coronary atherosclerosis. Atherosclerotic nonaneurysmal thoracic aorta. Normal caliber pulmonary arteries. No central pulmonary emboli. Mediastinum/Nodes: No discrete thyroid nodules. Unremarkable esophagus. No axillary adenopathy. No newly enlarged or residual pathologically enlarged mediastinal or hilar nodes. Previously visualized enlarged 1.8 cm short axis diameter left hilar node is decreased to 0.5 cm (series 2/image 83). Previously visualized enlarged 1.4 cm left paratracheal node is decreased to 0.8 cm (series 2/image 65). Previously  visualized enlarged 1.1 cm subcarinal node is decreased to 0.7 cm (series 2/image 83). Lungs/Pleura: No pneumothorax. No pleural effusion. Moderate paraseptal and centrilobular emphysema with diffuse bronchial wall thickening. No acute consolidative airspace disease or lung masses. Mixed cystic, ground-glass and solid anterior left upper lobe 2.2 x 1.6 cm nodule with tiny 0.3 cm solid component (series 5/image 65), previously 2.2 x 1.6 cm with 0.5 cm solid component, overall stable size with slightly decreased solid component. No new significant pulmonary nodules. Upper abdomen: Stable diffusely mildly thickened adrenal glands without discrete adrenal nodules, favoring adrenal hyperplasia. Nonobstructing 5 mm upper right renal stone. Musculoskeletal: No aggressive appearing focal osseous lesions. Minimal thoracic spondylosis. IMPRESSION: 1. Interval positive response to therapy. Left hilar, subcarinal and left paratracheal lymphadenopathy is all decreased, with no residual pathologically enlarged thoracic nodes. 2. Mixed cystic, ground-glass and solid anterior left upper lobe 2.2 cm nodule with tiny 0.3 cm solid component, overall stable size with slightly decreased solid component. Continued chest  CT surveillance warranted for this nodule. 3. No new or progressive findings of metastatic disease in the chest. 4. Three-vessel coronary atherosclerosis. 5. Stable diffusely mildly thickened adrenal glands without discrete adrenal nodules, favoring adrenal hyperplasia. 6. Nonobstructing right nephrolithiasis. 7. Aortic Atherosclerosis (ICD10-I70.0) and Emphysema (ICD10-J43.9). Electronically Signed   By: Ilona Sorrel M.D.   On: 06/27/2022 11:06    Impression/Plan: 1. 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 He appears to be recovering well from the effects of his recent chemoradiation and continues making gradual improvement regarding  his energy level and some mild residual dysphagia. His recent posttreatment CT chest scan on 06/26/2022 shows an excellent response to treatment with decreased size of thoracic lymphadenopathy and treated LLL lung lesion as well as a stable appearance of a 2 cm LUL lesion and no new nodules.  This scan was supposed to be coordinated with a follow-up visit with Dr. Julien Nordmann to review results and recommendations but a follow-up appointment has not yet been scheduled.  I advised that I will share this information with Dr. Julien Nordmann and that he will likely hear from one of his staff in the next few days to get that scheduled.  We did discuss the role for prophylactic cranial irradiation (PCI) but that we would await Dr. Worthy Flank recommendation before moving forward, to ensure there is no further need for systemic therapy.  2. Prophylactic cranial irradiation-we discussed the increased risk of potential brain metastasis in the setting of small cell lung cancer and the recommendation for PCI to help reduce the risk from approximately 60% down to around 15% as well as an improvement in overall survival.  We reviewed the anticipated 2-week course of daily treatment as well as the acute and late side effects to be expected.  Prior to proceeding with treatment, we would need to obtain a baseline MRI brain scan and pending this scan is negative, we would proceed with scheduling CT simulation/treatment planning in anticipation of beginning a 10 fraction course of daily radiotherapy for total dose of 25 Gy.  If there are any lesions present on the scan, we would still proceed with CT simulation/treatment planning in anticipation of beginning a 10 fraction course of daily radiotherapy for a total dose of 30 Gy.  The patient was encouraged to ask questions that were answered to his stated satisfaction and he is in agreement to proceed once we have the clearance from Dr. Julien Nordmann to move forward.  He appears to have a good  understanding of his disease and is comfortable and in agreement with the stated plan.    Nicholos Johns, PA-C

## 2022-07-09 DIAGNOSIS — I10 Essential (primary) hypertension: Secondary | ICD-10-CM | POA: Diagnosis not present

## 2022-07-09 DIAGNOSIS — E7800 Pure hypercholesterolemia, unspecified: Secondary | ICD-10-CM | POA: Diagnosis not present

## 2022-07-09 DIAGNOSIS — E1169 Type 2 diabetes mellitus with other specified complication: Secondary | ICD-10-CM | POA: Diagnosis not present

## 2022-07-10 DIAGNOSIS — J019 Acute sinusitis, unspecified: Secondary | ICD-10-CM | POA: Diagnosis not present

## 2022-07-14 DIAGNOSIS — E1169 Type 2 diabetes mellitus with other specified complication: Secondary | ICD-10-CM | POA: Diagnosis not present

## 2022-07-14 DIAGNOSIS — E7800 Pure hypercholesterolemia, unspecified: Secondary | ICD-10-CM | POA: Diagnosis not present

## 2022-07-21 ENCOUNTER — Inpatient Hospital Stay: Payer: Medicare Other | Attending: Internal Medicine

## 2022-07-21 ENCOUNTER — Inpatient Hospital Stay (HOSPITAL_BASED_OUTPATIENT_CLINIC_OR_DEPARTMENT_OTHER): Payer: Medicare Other | Admitting: Internal Medicine

## 2022-07-21 ENCOUNTER — Encounter: Payer: Self-pay | Admitting: Internal Medicine

## 2022-07-21 ENCOUNTER — Other Ambulatory Visit: Payer: Self-pay | Admitting: Urology

## 2022-07-21 VITALS — BP 105/67 | HR 88 | Temp 97.9°F | Resp 15 | Wt 194.8 lb

## 2022-07-21 DIAGNOSIS — C3492 Malignant neoplasm of unspecified part of left bronchus or lung: Secondary | ICD-10-CM

## 2022-07-21 DIAGNOSIS — C3490 Malignant neoplasm of unspecified part of unspecified bronchus or lung: Secondary | ICD-10-CM

## 2022-07-21 DIAGNOSIS — C3432 Malignant neoplasm of lower lobe, left bronchus or lung: Secondary | ICD-10-CM | POA: Insufficient documentation

## 2022-07-21 LAB — CMP (CANCER CENTER ONLY)
ALT: 18 U/L (ref 0–44)
AST: 15 U/L (ref 15–41)
Albumin: 4.1 g/dL (ref 3.5–5.0)
Alkaline Phosphatase: 68 U/L (ref 38–126)
Anion gap: 9 (ref 5–15)
BUN: 16 mg/dL (ref 8–23)
CO2: 24 mmol/L (ref 22–32)
Calcium: 9.5 mg/dL (ref 8.9–10.3)
Chloride: 107 mmol/L (ref 98–111)
Creatinine: 0.81 mg/dL (ref 0.61–1.24)
GFR, Estimated: >60 mL/min (ref >60)
Glucose, Bld: 105 mg/dL — ABNORMAL HIGH (ref 70–99)
Potassium: 3.9 mmol/L (ref 3.5–5.1)
Sodium: 140 mmol/L (ref 135–145)
Total Bilirubin: 0.5 mg/dL (ref 0.3–1.2)
Total Protein: 7.4 g/dL (ref 6.5–8.1)

## 2022-07-21 LAB — CBC WITH DIFFERENTIAL (CANCER CENTER ONLY)
Abs Immature Granulocytes: 0.06 10*3/uL (ref 0.00–0.07)
Basophils Absolute: 0.1 10*3/uL (ref 0.0–0.1)
Basophils Relative: 1 %
Eosinophils Absolute: 0.3 10*3/uL (ref 0.0–0.5)
Eosinophils Relative: 4 %
HCT: 31.8 % — ABNORMAL LOW (ref 39.0–52.0)
Hemoglobin: 10.6 g/dL — ABNORMAL LOW (ref 13.0–17.0)
Immature Granulocytes: 1 %
Lymphocytes Relative: 12 %
Lymphs Abs: 0.9 10*3/uL (ref 0.7–4.0)
MCH: 32.8 pg (ref 26.0–34.0)
MCHC: 33.3 g/dL (ref 30.0–36.0)
MCV: 98.5 fL (ref 80.0–100.0)
Monocytes Absolute: 0.9 10*3/uL (ref 0.1–1.0)
Monocytes Relative: 12 %
Neutro Abs: 5.2 10*3/uL (ref 1.7–7.7)
Neutrophils Relative %: 70 %
Platelet Count: 323 10*3/uL (ref 150–400)
RBC: 3.23 MIL/uL — ABNORMAL LOW (ref 4.22–5.81)
RDW: 19.7 % — ABNORMAL HIGH (ref 11.5–15.5)
WBC Count: 7.5 10*3/uL (ref 4.0–10.5)
nRBC: 0 % (ref 0.0–0.2)

## 2022-07-21 LAB — MAGNESIUM: Magnesium: 1.9 mg/dL (ref 1.7–2.4)

## 2022-07-21 NOTE — Progress Notes (Signed)
[place] [phone]   [phone]  OFFICE PROGRESS NOTE  [name], MD [place] [address]  DIAGNOSIS:  limited stage (T1c, N2, M0) small cell lung cancer with mixture of squamous cell carcinoma in the left upper lobe presented with left upper lobe lung nodule in addition to left hilar and mediastinal lymphadenopathy diagnosed in May 2023.  PRIOR THERAPY: Systemic chemotherapy with cisplatin 75 Mg/M2 on day 1 and etoposide 100 Mg/M2 on days 1, 2 and 3 every 3 weeks for 4 cycles.  This was concurrent with radiotherapy for around 6 weeks during this course of treatment.  Status post 4 cycles.  Last cycle was given on 06/09/2022  CURRENT THERAPY: None  INTERVAL HISTORY: Francisco Austin 71 y.o. male returns to the clinic today for follow-up visit accompanied by his wife.  The patient is feeling fine today with no concerning complaints except for mild fatigue.  He denied having any current chest pain, shortness of breath but has mild cough with no hemoptysis.  He has no nausea, vomiting, diarrhea or constipation.  He has no headache or visual changes.  He denied having any recent weight loss or night sweats.  He tolerated the previous course of concurrent chemoradiation fairly well.  The patient is here today for evaluation with repeat CT scan of the chest for restaging of his disease.  MEDICAL HISTORY: Past Medical History:  Diagnosis Date   Anxiety    Back pain    CAD (coronary artery disease)    COPD (chronic obstructive pulmonary disease) (HCC)    History of kidney stones    HLD (hyperlipidemia)    HTN (hypertension)    Pre-diabetes     ALLERGIES:  is allergic to gabapentin, morphine, other, prednisone, alprazolam, fentanyl, niacin, and penicillamine.  MEDICATIONS:  Current Outpatient Medications  Medication Sig Dispense Refill   albuterol (VENTOLIN HFA) 108 (90 Base) MCG/ACT inhaler Inhale 2 puffs into  the lungs every 6 (six) hours as needed for wheezing or shortness of breath.     aspirin EC 81 MG tablet Take 81 mg by mouth daily.     atorvastatin (LIPITOR) 80 MG tablet Take 1 tablet (80 mg total) by mouth daily. 90 tablet 1   Cholecalciferol 50 MCG (2000 UT) TABS Take 2,000 Units by mouth daily.     clopidogrel (PLAVIX) 75 MG tablet Take 75 mg by mouth daily.     diclofenac sodium (VOLTAREN) 1 % GEL Apply 1 g topically daily as needed (pain).     diltiazem (TIAZAC) 240 MG 24 hr capsule Take 240 mg by mouth daily.     esomeprazole (NEXIUM) 40 MG capsule Take 40 mg by mouth daily.     guaiFENesin (ROBITUSSIN) 100 MG/5ML liquid Take 10 mLs by mouth every 4 (four) hours as needed for cough or to loosen phlegm. 473 mL 5   HYDROcodone-acetaminophen (NORCO) 10-325 MG tablet Take 1-2 tablets by mouth every 6 (six) hours as needed.     LORazepam (ATIVAN) 1 MG tablet Take 1 mg by mouth 3 (three) times daily.     nitroGLYCERIN (NITROSTAT) 0.4 MG SL tablet Place 0.4 mg under the tongue every 5 (five) minutes as needed for chest pain.     prochlorperazine (COMPAZINE) 10 MG tablet Take 1 tablet (10 mg total) by mouth every 6 (six) hours as needed. 30 tablet 2   telmisartan (MICARDIS) 80 MG tablet Take 80 mg by mouth  daily.     umeclidinium-vilanterol (ANORO ELLIPTA) 62.5-25 MCG/INH AEPB Inhale 1 puff into the lungs daily.     vitamin B-12 (CYANOCOBALAMIN) 500 MCG tablet Take 500 mcg by mouth daily.     No current facility-administered medications for this visit.   Facility-Administered Medications Ordered in Other Visits  Medication Dose Route Frequency Provider Last Rate Last Admin   prochlorperazine (COMPAZINE) 10 MG/2ML injection             SURGICAL HISTORY:  Past Surgical History:  Procedure Laterality Date   APPENDECTOMY     CORONARY STENT INTERVENTION N/A 12/24/2021   Procedure: CORONARY STENT INTERVENTION;  Surgeon: Jettie Booze, MD;  Location: [facility];  Service:  Cardiovascular;  Laterality: N/A;   HEMORROIDECTOMY     INTRAVASCULAR ULTRASOUND/IVUS N/A 12/24/2021   Procedure: Intravascular Ultrasound/IVUS;  Surgeon: Jettie Booze, MD;  Location: [facility];  Service: Cardiovascular;  Laterality: N/A;   LEFT HEART CATH AND CORONARY ANGIOGRAPHY N/A 12/23/2021   Procedure: LEFT HEART CATH AND CORONARY ANGIOGRAPHY;  Surgeon: Lorretta Harp, MD;  Location: [facility];  Service: Cardiovascular;  Laterality: N/A;   TONSILLECTOMY     VIDEO BRONCHOSCOPY WITH ENDOBRONCHIAL ULTRASOUND N/A 03/12/2022   Procedure: VIDEO BRONCHOSCOPY WITH ENDOBRONCHIAL ULTRASOUND;  Surgeon: Melrose Nakayama, MD;  Location: [facility];  Service: Thoracic;  Laterality: N/A;    REVIEW OF SYSTEMS:  Constitutional: positive for fatigue Eyes: negative Ears, nose, mouth, throat, and face: negative Respiratory: negative Cardiovascular: negative Gastrointestinal: negative Genitourinary:negative Integument/breast: negative Hematologic/lymphatic: negative Musculoskeletal:negative Neurological: negative Behavioral/Psych: negative Endocrine: negative Allergic/Immunologic: negative   PHYSICAL EXAMINATION: General appearance: alert, cooperative, fatigued, and no distress Head: Normocephalic, without obvious abnormality, atraumatic Neck: no adenopathy, no JVD, supple, symmetrical, trachea midline, and thyroid not enlarged, symmetric, no tenderness/mass/nodules Lymph nodes: Cervical, supraclavicular, and axillary nodes normal. Resp: clear to auscultation bilaterally Back: symmetric, no curvature. ROM normal. No CVA tenderness. Cardio: regular rate and rhythm, S1, S2 normal, no murmur, click, rub or gallop GI: soft, non-tender; bowel sounds normal; no masses,  no organomegaly Extremities: extremities normal, atraumatic, no cyanosis or edema Neurologic: Alert and oriented X 3, normal strength and tone. Normal symmetric reflexes. Normal coordination and gait  ECOG  PERFORMANCE STATUS: 1 - Symptomatic but completely ambulatory  Blood pressure 105/67, pulse 88, temperature 97.9 F (36.6 C), temperature source Oral, resp. rate 15, weight 194 lb 12.8 oz (88.4 kg), SpO2 96 %.  LABORATORY DATA: Lab Results  Component Value Date   WBC 7.5 07/21/2022   HGB 10.6 (L) 07/21/2022   HCT 31.8 (L) 07/21/2022   MCV 98.5 07/21/2022   PLT 323 07/21/2022      Chemistry      Component Value Date/Time   NA 134 (L) 06/16/2022 1246   NA 140 12/13/2021 0900   K 4.4 06/16/2022 1246   CL 104 06/16/2022 1246   CO2 27 06/16/2022 1246   BUN 19 06/16/2022 1246   BUN 9 12/13/2021 0900   CREATININE 1.11 06/16/2022 1246      Component Value Date/Time   CALCIUM 8.9 06/16/2022 1246   ALKPHOS 74 06/16/2022 1246   AST 14 (L) 06/16/2022 1246   ALT 15 06/16/2022 1246   BILITOT 0.3 06/16/2022 1246       RADIOGRAPHIC STUDIES: CT Chest W Contrast  Result Date: 06/27/2022 CLINICAL DATA:  Small cell lung cancer status post chemotherapy and radiation therapy. Restaging. * Tracking Code: BO * EXAM: CT CHEST WITH CONTRAST  TECHNIQUE: Multidetector CT imaging of the chest was performed during intravenous contrast administration. RADIATION DOSE REDUCTION: This exam was performed according to the departmental dose-optimization program which includes automated exposure control, adjustment of the mA and/or kV according to patient size and/or use of iterative reconstruction technique. CONTRAST:  34mL OMNIPAQUE IOHEXOL 300 MG/ML  SOLN COMPARISON:  01/29/2022 chest CT.  02/26/2022 PET-CT. FINDINGS: Cardiovascular: Normal heart size. Stable lipomatous hypertrophy of the interatrial septum. No significant pericardial effusion/thickening. Three-vessel coronary atherosclerosis. Atherosclerotic nonaneurysmal thoracic aorta. Normal caliber pulmonary arteries. No central pulmonary emboli. Mediastinum/Nodes: No discrete thyroid nodules. Unremarkable esophagus. No axillary adenopathy. No newly  enlarged or residual pathologically enlarged mediastinal or hilar nodes. Previously visualized enlarged 1.8 cm short axis diameter left hilar node is decreased to 0.5 cm (series 2/image 83). Previously visualized enlarged 1.4 cm left paratracheal node is decreased to 0.8 cm (series 2/image 65). Previously visualized enlarged 1.1 cm subcarinal node is decreased to 0.7 cm (series 2/image 83). Lungs/Pleura: No pneumothorax. No pleural effusion. Moderate paraseptal and centrilobular emphysema with diffuse bronchial wall thickening. No acute consolidative airspace disease or lung masses. Mixed cystic, ground-glass and solid anterior left upper lobe 2.2 x 1.6 cm nodule with tiny 0.3 cm solid component (series 5/image 65), previously 2.2 x 1.6 cm with 0.5 cm solid component, overall stable size with slightly decreased solid component. No new significant pulmonary nodules. Upper abdomen: Stable diffusely mildly thickened adrenal glands without discrete adrenal nodules, favoring adrenal hyperplasia. Nonobstructing 5 mm upper right renal stone. Musculoskeletal: No aggressive appearing focal osseous lesions. Minimal thoracic spondylosis. IMPRESSION: 1. Interval positive response to therapy. Left hilar, subcarinal and left paratracheal lymphadenopathy is all decreased, with no residual pathologically enlarged thoracic nodes. 2. Mixed cystic, ground-glass and solid anterior left upper lobe 2.2 cm nodule with tiny 0.3 cm solid component, overall stable size with slightly decreased solid component. Continued chest CT surveillance warranted for this nodule. 3. No new or progressive findings of metastatic disease in the chest. 4. Three-vessel coronary atherosclerosis. 5. Stable diffusely mildly thickened adrenal glands without discrete adrenal nodules, favoring adrenal hyperplasia. 6. Nonobstructing right nephrolithiasis. 7. Aortic Atherosclerosis (ICD10-I70.0) and Emphysema (ICD10-J43.9). Electronically Signed   By: Ilona Sorrel  M.D.   On: 06/27/2022 11:06    ASSESSMENT AND PLAN: This is a very pleasant 71 years old white male diagnosed with limited stage (T1c, N2, M0) small cell lung cancer with mixture of squamous cell carcinoma in the left upper lobe presented with left upper lobe lung nodule in addition to left hilar and mediastinal lymphadenopathy diagnosed in May 2023.  The patient underwent systemic chemotherapy with cisplatin 75 Mg/M2 on day 1 and etoposide 100 Mg/M2 on days 1, 2 and 3 status post 4 cycles.  This is concurrent with radiotherapy.  Last dose of chemotherapy was on 06/09/2022. The patient tolerated the previous course of his treatment well except for fatigue. He had repeat CT scan of the chest performed recently.  I personally and independently reviewed the scan and discussed the result with the patient and his wife. His scan showed no concerning findings for disease progression and he continues to have further improvement of his disease. I recommended for the patient to continue on observation with repeat CT scan of the chest in 3 months. I also recommend for the patient to follow with radiation oncology for consideration of prophylactic cranial irradiation. The patient was advised to call immediately if he has any other concerning symptoms in the interval. The patient voices understanding of  current disease status and treatment options and is in agreement with the current care plan.  All questions were answered. The patient knows to call the clinic with any problems, questions or concerns. We can certainly see the patient much sooner if necessary.  The total time spent in the appointment was 30 minutes.  Disclaimer: This note was dictated with voice recognition software. Similar sounding words can inadvertently be transcribed and may not be corrected upon review.

## 2022-07-22 ENCOUNTER — Ambulatory Visit: Payer: Medicare Other | Attending: Cardiovascular Disease | Admitting: Cardiovascular Disease

## 2022-07-22 ENCOUNTER — Telehealth: Payer: Self-pay | Admitting: Internal Medicine

## 2022-07-22 ENCOUNTER — Encounter: Payer: Self-pay | Admitting: Cardiovascular Disease

## 2022-07-22 ENCOUNTER — Telehealth: Payer: Self-pay | Admitting: *Deleted

## 2022-07-22 VITALS — BP 108/68 | HR 77 | Ht 71.0 in | Wt 194.8 lb

## 2022-07-22 DIAGNOSIS — I10 Essential (primary) hypertension: Secondary | ICD-10-CM

## 2022-07-22 DIAGNOSIS — E782 Mixed hyperlipidemia: Secondary | ICD-10-CM

## 2022-07-22 DIAGNOSIS — I25119 Atherosclerotic heart disease of native coronary artery with unspecified angina pectoris: Secondary | ICD-10-CM | POA: Diagnosis not present

## 2022-07-22 DIAGNOSIS — F17200 Nicotine dependence, unspecified, uncomplicated: Secondary | ICD-10-CM | POA: Diagnosis not present

## 2022-07-22 NOTE — Assessment & Plan Note (Signed)
History of CAD status post coronary CTA performed 12/16/2021 revealing a coronary calcium score of 1970 with moderate to severe multivessel disease.  Based on this I performed outpatient radial diagnostic cath on him 12/23/2021 revealing a high-grade calcified proximal LAD disease as well as proximal to mid RCA disease with normal LV function.  He did develop hypotension and with conduction abnormality with my TIG catheter across his aortic valve and therefore I aborted his intervention.  The following day Dr. Irish Lack stented his LAD and did orbital atherectomy, PCI and stenting of his RCA.  He was discharged home the following day on dual antiplatelet therapy.  He remains clinically stable and denies chest pain.

## 2022-07-22 NOTE — Telephone Encounter (Signed)
CALLED PATIENT TO GIVE INFO., SPOKE WITH PATIENT AND GAVE HIM INFO.

## 2022-07-22 NOTE — Telephone Encounter (Signed)
CALLED PATIENT TO INFORM OF MRI FOR 07-30-22- ARRIVAL TIME- 11:30 AM @ [facility], NO RESTRICTIONS TO TEST, PATIENT TO HAVE SIM. APPT. ON 08-14-22- ARRIVAL TIME- 9:45 AM @ [facility], SPOKE WITH PATIENT AND HE IS AWARE OF THESE APPTS. AND THE INSTRUCTIONS

## 2022-07-22 NOTE — Assessment & Plan Note (Signed)
Ongoing tobacco abuse smoking a pipe on occasion.

## 2022-07-22 NOTE — Assessment & Plan Note (Signed)
History of essential hypertension blood pressure measured today at 108/68.  He is on diltiazem and Micardis.

## 2022-07-22 NOTE — Patient Instructions (Signed)
Medication Instructions:  Your physician recommends that you continue on your current medications as directed. Please refer to the Current Medication list given to you today.  *If you need a refill on your cardiac medications before your next appointment, please call your pharmacy*   Follow-Up: At [facility], you and your health needs are our priority.  As part of our continuing mission to provide you with exceptional heart care, we have created designated Provider Care Teams.  These Care Teams include your primary Cardiologist (physician) and Advanced Practice Providers (APPs -  Physician Assistants and Nurse Practitioners) who all work together to provide you with the care you need, when you need it.  We recommend signing up for the patient portal called "MyChart".  Sign up information is provided on this After Visit Summary.  MyChart is used to connect with patients for Virtual Visits (Telemedicine).  Patients are able to view lab/test results, encounter notes, upcoming appointments, etc.  Non-urgent messages can be sent to your provider as well.   To learn more about what you can do with MyChart, go to [email or web address].    Your next appointment:   6 month(s)  The format for your next appointment:   In Person  Provider:   Fabian Sharp, PA-C, Sande Rives, PA-C, Jory Sims, DNP, ANP, Almyra Deforest, PA-C, or Diona Browner, NP       Then, Quay Burow, MD will plan to see you again in 12 month(s).

## 2022-07-22 NOTE — Assessment & Plan Note (Signed)
History of hyperlipidemia on high-dose atorvastatin with lipid profile performed 07/14/2022 revealing total cholesterol 131, LDL of 66 and HDL 42.

## 2022-07-22 NOTE — Progress Notes (Signed)
07/22/2022 Francisco Austin   1951/04/22  [id number]  Primary Physician [name], MD Primary Cardiologist: Lorretta Harp MD [name], [name]  HPI:  Francisco Austin is a 71 y.o.    father of 2 children, with no grandchildren, former patient of Dr. Thurman Coyer, who was referred to me by Dr. [name] to be established in my practice because of known coronary artery disease.  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  I last saw him in the office 01/15/2022. He does have treated hypertension hyperlipidemia.  His mother Francisco Austin who died in Jan 30, 2017 was my patient as well.  He is never had a heart attack or stroke.  He is had several heart catheterizations Dr. [name] remotely with one PCI but no stent.  He does get frequent atypical chest pain.  His last 2D echo performed 04/06/2017 was essentially normal.   Because of ongoing chest pain I obtain a coronary CTA on 12/16/2021 that revealed a coronary calcium score of 1970 with moderate to severe multivessel disease.  Based on this I performed outpatient radial diagnostic cath on him 12/23/2021 revealing a high-grade calcified proximal LAD disease as well as proximal to mid RCA disease.  LV function was normal.  He did develop hypotension with a conduction abnormality when my TIG catheter across his aortic valve and therefore I aborted his intervention and staged the following day.  Dr. Irish Lack stented his proximal LAD and performed orbital atherectomy and stenting of his RCA.  He was discharged home the following day on aspirin and clopidogrel.  Since I saw him 6 months ago he was diagnosed with small cell and squamous cell lung cancer and is undergoing chemotherapy and radiation therapy.  He is scheduled to undergo brain radiation therapy as well.  He is treated by Dr. Lorna Few.  From a cardiovascular point of view, he remains clinically stable with occasional atypical chest pain.   Current Meds  Medication Sig    albuterol (VENTOLIN HFA) 108 (90 Base) MCG/ACT inhaler Inhale 2 puffs into the lungs every 6 (six) hours as needed for wheezing or shortness of breath.   aspirin EC 81 MG tablet Take 81 mg by mouth daily.   atorvastatin (LIPITOR) 80 MG tablet Take 1 tablet (80 mg total) by mouth daily.   Cholecalciferol 50 MCG (2000 UT) TABS Take 2,000 Units by mouth daily.   clopidogrel (PLAVIX) 75 MG tablet Take 75 mg by mouth daily.   diclofenac sodium (VOLTAREN) 1 % GEL Apply 1 g topically daily as needed (pain).   diltiazem (TIAZAC) 240 MG 24 hr capsule Take 240 mg by mouth daily.   esomeprazole (NEXIUM) 40 MG capsule Take 40 mg by mouth daily.   guaiFENesin (ROBITUSSIN) 100 MG/5ML liquid Take 10 mLs by mouth every 4 (four) hours as needed for cough or to loosen phlegm.   HYDROcodone-acetaminophen (NORCO) 10-325 MG tablet Take 1-2 tablets by mouth every 6 (six) hours as needed.   LORazepam (ATIVAN) 1 MG tablet Take 1 mg by mouth 3 (three) times daily.   nitroGLYCERIN (NITROSTAT) 0.4 MG SL tablet Place 0.4 mg under the tongue every 5 (five) minutes as needed for chest pain.   prochlorperazine (COMPAZINE) 10 MG tablet Take 1 tablet (10 mg total) by mouth every 6 (six) hours as needed.   telmisartan (MICARDIS) 80 MG tablet Take 80 mg by mouth daily.   umeclidinium-vilanterol (ANORO ELLIPTA) 62.5-25 MCG/INH AEPB Inhale 1 puff into  the lungs daily.   vitamin B-12 (CYANOCOBALAMIN) 500 MCG tablet Take 500 mcg by mouth daily.     Allergies  Allergen Reactions   Gabapentin Anaphylaxis and Other (See Comments)    B/P dropped to "40"    Morphine Other (See Comments)    Burn his veins   Other Anaphylaxis    Androgenic Anabolic Steroid   Prednisone Anaphylaxis   Alprazolam Other (See Comments)    Other reaction(s): weirds him out   Fentanyl     Does not relieve pain for him    Niacin Other (See Comments)    Body feel weird   Penicillamine Hives    Social History   Socioeconomic History   Marital  status: Married    Spouse name: Not on file   Number of children: Not on file   Years of education: Not on file   Highest education level: Not on file  Occupational History   Not on file  Tobacco Use   Smoking status: Every Day    Types: Pipe   Smokeless tobacco: Never  Vaping Use   Vaping Use: Never used  Substance and Sexual Activity   Alcohol use: Never   Drug use: Never   Sexual activity: Not Currently  Other Topics Concern   Not on file  Social History Narrative   Not on file   Social Determinants of Health   Financial Resource Strain: Not on file  Food Insecurity: Not on file  Transportation Needs: Not on file  Physical Activity: Not on file  Stress: Not on file  Social Connections: Not on file  Intimate Partner Violence: Not on file     Review of Systems: General: negative for chills, fever, night sweats or weight changes.  Cardiovascular: negative for chest pain, dyspnea on exertion, edema, orthopnea, palpitations, paroxysmal nocturnal dyspnea or shortness of breath Dermatological: negative for rash Respiratory: negative for cough or wheezing Urologic: negative for hematuria Abdominal: negative for nausea, vomiting, diarrhea, bright red blood per rectum, melena, or hematemesis Neurologic: negative for visual changes, syncope, or dizziness All other systems reviewed and are otherwise negative except as noted above.    Blood pressure 108/68, pulse 77, height 5\' 11"  (1.803 m), weight 194 lb 12.8 oz (88.4 kg), SpO2 98 %.  General appearance: alert and no distress Neck: no adenopathy, no carotid bruit, no JVD, supple, symmetrical, trachea midline, and thyroid not enlarged, symmetric, no tenderness/mass/nodules Lungs: clear to auscultation bilaterally Heart: regular rate and rhythm, S1, S2 normal, no murmur, click, rub or gallop Extremities: extremities normal, atraumatic, no cyanosis or edema Pulses: 2+ and symmetric Skin: Skin color, texture, turgor normal. No  rashes or lesions Neurologic: Grossly normal  EKG sinus rhythm at 77 without ST or T wave changes.  Personally reviewed this EKG.  ASSESSMENT AND PLAN:   Essential hypertension History of essential hypertension blood pressure measured today at 108/68.  He is on diltiazem and Micardis.  Hyperlipidemia History of hyperlipidemia on high-dose atorvastatin with lipid profile performed 07/14/2022 revealing total cholesterol 131, LDL of 66 and HDL 42.  Coronary artery disease History of CAD status post coronary CTA performed 12/16/2021 revealing a coronary calcium score of 1970 with moderate to severe multivessel disease.  Based on this I performed outpatient radial diagnostic cath on him 12/23/2021 revealing a high-grade calcified proximal LAD disease as well as proximal to mid RCA disease with normal LV function.  He did develop hypotension and with conduction abnormality with my TIG catheter across his aortic  valve and therefore I aborted his intervention.  The following day Dr. Irish Lack stented his LAD and did orbital atherectomy, PCI and stenting of his RCA.  He was discharged home the following day on dual antiplatelet therapy.  He remains clinically stable and denies chest pain.  Tobacco use disorder Ongoing tobacco abuse smoking a pipe on occasion.     Lorretta Harp MD FACP,FACC,FAHA, [facility] 07/22/2022 8:25 AM

## 2022-07-22 NOTE — Telephone Encounter (Signed)
Scheduled follow-up appointments per 9/18 los. Patient is aware.

## 2022-07-30 ENCOUNTER — Ambulatory Visit (HOSPITAL_COMMUNITY)
Admission: RE | Admit: 2022-07-30 | Discharge: 2022-07-30 | Disposition: A | Discharge status: Home / Self Care | Payer: Medicare Other | Source: Ambulatory Visit | Attending: Urology | Admitting: Urology

## 2022-07-30 DIAGNOSIS — C3490 Malignant neoplasm of unspecified part of unspecified bronchus or lung: Secondary | ICD-10-CM | POA: Insufficient documentation

## 2022-07-30 MED ORDER — GADOPICLENOL 0.5 MMOL/ML IV SOLN
9.0000 mL | Freq: Once | INTRAVENOUS | Status: AC | PRN
  Administered 2022-07-30: 9 mL via INTRAVENOUS

## 2022-08-01 ENCOUNTER — Telehealth: Payer: Self-pay

## 2022-08-01 NOTE — Telephone Encounter (Signed)
RN called patient after verifying identity was able to give MRI results as per Freeman Caldron, PA-C and to inform patient about upcoming appointment for CT simulation on 08/14/2022.  Patient was appreciative of information and agreed to be at his next appointment.

## 2022-08-05 ENCOUNTER — Other Ambulatory Visit: Payer: Self-pay | Admitting: Physician Assistant

## 2022-08-05 DIAGNOSIS — C3492 Malignant neoplasm of unspecified part of left bronchus or lung: Secondary | ICD-10-CM

## 2022-08-07 DIAGNOSIS — E7800 Pure hypercholesterolemia, unspecified: Secondary | ICD-10-CM | POA: Diagnosis not present

## 2022-08-07 DIAGNOSIS — I10 Essential (primary) hypertension: Secondary | ICD-10-CM | POA: Diagnosis not present

## 2022-08-07 DIAGNOSIS — E1169 Type 2 diabetes mellitus with other specified complication: Secondary | ICD-10-CM | POA: Diagnosis not present

## 2022-08-13 DIAGNOSIS — C3412 Malignant neoplasm of upper lobe, left bronchus or lung: Secondary | ICD-10-CM | POA: Diagnosis not present

## 2022-08-13 DIAGNOSIS — Z2989 Encounter for other specified prophylactic measures: Secondary | ICD-10-CM | POA: Diagnosis not present

## 2022-08-13 DIAGNOSIS — F17210 Nicotine dependence, cigarettes, uncomplicated: Secondary | ICD-10-CM | POA: Diagnosis not present

## 2022-08-13 NOTE — Progress Notes (Signed)
  Radiation Oncology         [phone] ________________________________  Name: Francisco Austin MRN: [id number]  Date: 08/14/2022  DOB: June 14, 1951  SIMULATION AND TREATMENT PLANNING NOTE    ICD-10-CM   1. Small cell lung cancer, left (HCC)  C34.92       DIAGNOSIS:  71 yo man with NSCLC, syncrhonous squamous cell carcinoma involving the LLL lung and limited stage small cell carcinoma in an 11L node   NARRATIVE:  The patient was brought to the [facility].  Identity was confirmed.  All relevant records and images related to the planned course of therapy were reviewed.  The patient freely provided informed written consent to proceed with treatment after reviewing the details related to the planned course of therapy. The consent form was witnessed and verified by the simulation staff.  Then, the patient was set-up in a stable reproducible  supine position for radiation therapy.  CT images were obtained.  Surface markings were placed.  The CT images were loaded into the planning software.  Then the target and avoidance structures were contoured.  Treatment planning then occurred.  The radiation prescription was entered and confirmed.  Then, I designed and supervised the construction of a total of 3 medically necessary complex treatment device including a custom made thermoplastic mask used for immobilization, and two MLC collimator apertures for radiotherapy from the right and left side, with independent collimation for each to account for beam divergence.  I have requested : Isodose Plan.    PLAN:  The whole brain will be treated to 25 Gy in 10 fractions for prophylactic cranial irradiation.  ________________________________  Sheral Apley Tammi Klippel, M.D.

## 2022-08-14 ENCOUNTER — Other Ambulatory Visit: Payer: Self-pay

## 2022-08-14 ENCOUNTER — Ambulatory Visit
Admission: RE | Admit: 2022-08-14 | Discharge: 2022-08-14 | Disposition: A | Discharge status: Home / Self Care | Payer: Medicare Other | Source: Ambulatory Visit | Attending: Radiation Oncology | Admitting: Radiation Oncology

## 2022-08-14 DIAGNOSIS — C3412 Malignant neoplasm of upper lobe, left bronchus or lung: Secondary | ICD-10-CM | POA: Diagnosis not present

## 2022-08-14 DIAGNOSIS — Z2989 Encounter for other specified prophylactic measures: Secondary | ICD-10-CM | POA: Diagnosis not present

## 2022-08-14 DIAGNOSIS — C3492 Malignant neoplasm of unspecified part of left bronchus or lung: Secondary | ICD-10-CM | POA: Insufficient documentation

## 2022-08-14 DIAGNOSIS — F17210 Nicotine dependence, cigarettes, uncomplicated: Secondary | ICD-10-CM | POA: Diagnosis not present

## 2022-08-15 DIAGNOSIS — I10 Essential (primary) hypertension: Secondary | ICD-10-CM | POA: Diagnosis not present

## 2022-08-15 DIAGNOSIS — C3490 Malignant neoplasm of unspecified part of unspecified bronchus or lung: Secondary | ICD-10-CM | POA: Diagnosis not present

## 2022-08-15 DIAGNOSIS — G894 Chronic pain syndrome: Secondary | ICD-10-CM | POA: Diagnosis not present

## 2022-08-15 DIAGNOSIS — E7800 Pure hypercholesterolemia, unspecified: Secondary | ICD-10-CM | POA: Diagnosis not present

## 2022-08-15 DIAGNOSIS — F17200 Nicotine dependence, unspecified, uncomplicated: Secondary | ICD-10-CM | POA: Diagnosis not present

## 2022-08-15 DIAGNOSIS — E1169 Type 2 diabetes mellitus with other specified complication: Secondary | ICD-10-CM | POA: Diagnosis not present

## 2022-08-15 DIAGNOSIS — K219 Gastro-esophageal reflux disease without esophagitis: Secondary | ICD-10-CM | POA: Diagnosis not present

## 2022-08-15 DIAGNOSIS — Z0000 Encounter for general adult medical examination without abnormal findings: Secondary | ICD-10-CM | POA: Diagnosis not present

## 2022-08-15 DIAGNOSIS — I2510 Atherosclerotic heart disease of native coronary artery without angina pectoris: Secondary | ICD-10-CM | POA: Diagnosis not present

## 2022-08-15 DIAGNOSIS — Z23 Encounter for immunization: Secondary | ICD-10-CM | POA: Diagnosis not present

## 2022-08-15 DIAGNOSIS — Z1211 Encounter for screening for malignant neoplasm of colon: Secondary | ICD-10-CM | POA: Diagnosis not present

## 2022-08-18 DIAGNOSIS — C3412 Malignant neoplasm of upper lobe, left bronchus or lung: Secondary | ICD-10-CM | POA: Diagnosis not present

## 2022-08-18 DIAGNOSIS — F17210 Nicotine dependence, cigarettes, uncomplicated: Secondary | ICD-10-CM | POA: Diagnosis not present

## 2022-08-18 DIAGNOSIS — Z2989 Encounter for other specified prophylactic measures: Secondary | ICD-10-CM | POA: Diagnosis not present

## 2022-08-18 DIAGNOSIS — C3492 Malignant neoplasm of unspecified part of left bronchus or lung: Secondary | ICD-10-CM | POA: Diagnosis not present

## 2022-08-21 ENCOUNTER — Other Ambulatory Visit: Payer: Self-pay

## 2022-08-21 ENCOUNTER — Ambulatory Visit
Admission: RE | Admit: 2022-08-21 | Discharge: 2022-08-21 | Disposition: A | Discharge status: Home / Self Care | Payer: Medicare Other | Source: Ambulatory Visit | Attending: Radiation Oncology | Admitting: Radiation Oncology

## 2022-08-21 DIAGNOSIS — F17210 Nicotine dependence, cigarettes, uncomplicated: Secondary | ICD-10-CM | POA: Diagnosis not present

## 2022-08-21 DIAGNOSIS — Z510 Encounter for antineoplastic radiation therapy: Secondary | ICD-10-CM | POA: Diagnosis not present

## 2022-08-21 DIAGNOSIS — C3492 Malignant neoplasm of unspecified part of left bronchus or lung: Secondary | ICD-10-CM | POA: Diagnosis not present

## 2022-08-21 DIAGNOSIS — Z2989 Encounter for other specified prophylactic measures: Secondary | ICD-10-CM | POA: Diagnosis not present

## 2022-08-21 DIAGNOSIS — C3412 Malignant neoplasm of upper lobe, left bronchus or lung: Secondary | ICD-10-CM | POA: Diagnosis not present

## 2022-08-21 LAB — RAD ONC ARIA SESSION SUMMARY
Course Elapsed Days: 0
Plan Fractions Treated to Date: 1
Plan Prescribed Dose Per Fraction: 2.5 Gy
Plan Total Fractions Prescribed: 10
Plan Total Prescribed Dose: 25 Gy
Reference Point Dosage Given to Date: 2.5 Gy
Reference Point Session Dosage Given: 2.5 Gy
Session Number: 1

## 2022-08-22 ENCOUNTER — Ambulatory Visit
Admission: RE | Admit: 2022-08-22 | Discharge: 2022-08-22 | Disposition: A | Discharge status: Home / Self Care | Payer: Medicare Other | Source: Ambulatory Visit | Attending: Radiation Oncology | Admitting: Radiation Oncology

## 2022-08-22 ENCOUNTER — Other Ambulatory Visit: Payer: Self-pay

## 2022-08-22 DIAGNOSIS — C3492 Malignant neoplasm of unspecified part of left bronchus or lung: Secondary | ICD-10-CM | POA: Diagnosis not present

## 2022-08-22 LAB — RAD ONC ARIA SESSION SUMMARY
Course Elapsed Days: 1
Plan Fractions Treated to Date: 2
Plan Prescribed Dose Per Fraction: 2.5 Gy
Plan Total Fractions Prescribed: 10
Plan Total Prescribed Dose: 25 Gy
Reference Point Dosage Given to Date: 5 Gy
Reference Point Session Dosage Given: 2.5 Gy
Session Number: 2

## 2022-08-25 ENCOUNTER — Other Ambulatory Visit: Payer: Self-pay

## 2022-08-25 ENCOUNTER — Ambulatory Visit
Admission: RE | Admit: 2022-08-25 | Discharge: 2022-08-25 | Disposition: A | Discharge status: Home / Self Care | Payer: Medicare Other | Source: Ambulatory Visit | Attending: Radiation Oncology | Admitting: Radiation Oncology

## 2022-08-25 DIAGNOSIS — C3492 Malignant neoplasm of unspecified part of left bronchus or lung: Secondary | ICD-10-CM | POA: Diagnosis not present

## 2022-08-25 LAB — RAD ONC ARIA SESSION SUMMARY
Course Elapsed Days: 4
Plan Fractions Treated to Date: 3
Plan Prescribed Dose Per Fraction: 2.5 Gy
Plan Total Fractions Prescribed: 10
Plan Total Prescribed Dose: 25 Gy
Reference Point Dosage Given to Date: 7.5 Gy
Reference Point Session Dosage Given: 2.5 Gy
Session Number: 3

## 2022-08-26 ENCOUNTER — Ambulatory Visit
Admission: RE | Admit: 2022-08-26 | Discharge: 2022-08-26 | Disposition: A | Discharge status: Home / Self Care | Payer: Medicare Other | Source: Ambulatory Visit | Attending: Radiation Oncology | Admitting: Radiation Oncology

## 2022-08-26 ENCOUNTER — Other Ambulatory Visit: Payer: Self-pay

## 2022-08-26 DIAGNOSIS — C3492 Malignant neoplasm of unspecified part of left bronchus or lung: Secondary | ICD-10-CM | POA: Diagnosis not present

## 2022-08-26 LAB — RAD ONC ARIA SESSION SUMMARY
Course Elapsed Days: 5
Plan Fractions Treated to Date: 4
Plan Prescribed Dose Per Fraction: 2.5 Gy
Plan Total Fractions Prescribed: 10
Plan Total Prescribed Dose: 25 Gy
Reference Point Dosage Given to Date: 10 Gy
Reference Point Session Dosage Given: 2.5 Gy
Session Number: 4

## 2022-08-27 ENCOUNTER — Other Ambulatory Visit: Payer: Self-pay

## 2022-08-27 ENCOUNTER — Ambulatory Visit
Admission: RE | Admit: 2022-08-27 | Discharge: 2022-08-27 | Disposition: A | Discharge status: Home / Self Care | Payer: Medicare Other | Source: Ambulatory Visit | Attending: Radiation Oncology | Admitting: Radiation Oncology

## 2022-08-27 DIAGNOSIS — C3492 Malignant neoplasm of unspecified part of left bronchus or lung: Secondary | ICD-10-CM | POA: Diagnosis not present

## 2022-08-27 DIAGNOSIS — F17210 Nicotine dependence, cigarettes, uncomplicated: Secondary | ICD-10-CM | POA: Diagnosis not present

## 2022-08-27 DIAGNOSIS — Z510 Encounter for antineoplastic radiation therapy: Secondary | ICD-10-CM | POA: Diagnosis not present

## 2022-08-27 DIAGNOSIS — C3412 Malignant neoplasm of upper lobe, left bronchus or lung: Secondary | ICD-10-CM | POA: Diagnosis not present

## 2022-08-27 DIAGNOSIS — Z2989 Encounter for other specified prophylactic measures: Secondary | ICD-10-CM | POA: Diagnosis not present

## 2022-08-27 LAB — RAD ONC ARIA SESSION SUMMARY
Course Elapsed Days: 6
Plan Fractions Treated to Date: 5
Plan Prescribed Dose Per Fraction: 2.5 Gy
Plan Total Fractions Prescribed: 10
Plan Total Prescribed Dose: 25 Gy
Reference Point Dosage Given to Date: 12.5 Gy
Reference Point Session Dosage Given: 2.5 Gy
Session Number: 5

## 2022-08-28 ENCOUNTER — Ambulatory Visit
Admission: RE | Admit: 2022-08-28 | Discharge: 2022-08-28 | Disposition: A | Discharge status: Home / Self Care | Payer: Medicare Other | Source: Ambulatory Visit | Attending: Radiation Oncology | Admitting: Radiation Oncology

## 2022-08-28 ENCOUNTER — Other Ambulatory Visit: Payer: Self-pay

## 2022-08-28 DIAGNOSIS — C3492 Malignant neoplasm of unspecified part of left bronchus or lung: Secondary | ICD-10-CM | POA: Diagnosis not present

## 2022-08-28 LAB — RAD ONC ARIA SESSION SUMMARY
Course Elapsed Days: 7
Plan Fractions Treated to Date: 6
Plan Prescribed Dose Per Fraction: 2.5 Gy
Plan Total Fractions Prescribed: 10
Plan Total Prescribed Dose: 25 Gy
Reference Point Dosage Given to Date: 15 Gy
Reference Point Session Dosage Given: 2.5 Gy
Session Number: 6

## 2022-08-29 ENCOUNTER — Ambulatory Visit
Admission: RE | Admit: 2022-08-29 | Discharge: 2022-08-29 | Disposition: A | Discharge status: Home / Self Care | Payer: Medicare Other | Source: Ambulatory Visit | Attending: Radiation Oncology | Admitting: Radiation Oncology

## 2022-08-29 ENCOUNTER — Other Ambulatory Visit: Payer: Self-pay

## 2022-08-29 DIAGNOSIS — C3492 Malignant neoplasm of unspecified part of left bronchus or lung: Secondary | ICD-10-CM | POA: Diagnosis not present

## 2022-08-29 LAB — RAD ONC ARIA SESSION SUMMARY
Course Elapsed Days: 8
Plan Fractions Treated to Date: 7
Plan Prescribed Dose Per Fraction: 2.5 Gy
Plan Total Fractions Prescribed: 10
Plan Total Prescribed Dose: 25 Gy
Reference Point Dosage Given to Date: 17.5 Gy
Reference Point Session Dosage Given: 2.5 Gy
Session Number: 7

## 2022-09-01 ENCOUNTER — Other Ambulatory Visit: Payer: Self-pay

## 2022-09-01 ENCOUNTER — Ambulatory Visit
Admission: RE | Admit: 2022-09-01 | Discharge: 2022-09-01 | Disposition: A | Discharge status: Home / Self Care | Payer: Medicare Other | Source: Ambulatory Visit | Attending: Radiation Oncology | Admitting: Radiation Oncology

## 2022-09-01 DIAGNOSIS — C3492 Malignant neoplasm of unspecified part of left bronchus or lung: Secondary | ICD-10-CM | POA: Diagnosis not present

## 2022-09-01 LAB — RAD ONC ARIA SESSION SUMMARY
Course Elapsed Days: 11
Plan Fractions Treated to Date: 8
Plan Prescribed Dose Per Fraction: 2.5 Gy
Plan Total Fractions Prescribed: 10
Plan Total Prescribed Dose: 25 Gy
Reference Point Dosage Given to Date: 20 Gy
Reference Point Session Dosage Given: 2.5 Gy
Session Number: 8

## 2022-09-02 ENCOUNTER — Other Ambulatory Visit: Payer: Self-pay

## 2022-09-02 ENCOUNTER — Ambulatory Visit
Admission: RE | Admit: 2022-09-02 | Discharge: 2022-09-02 | Disposition: A | Discharge status: Home / Self Care | Payer: Medicare Other | Source: Ambulatory Visit | Attending: Radiation Oncology | Admitting: Radiation Oncology

## 2022-09-02 DIAGNOSIS — C3492 Malignant neoplasm of unspecified part of left bronchus or lung: Secondary | ICD-10-CM | POA: Diagnosis not present

## 2022-09-02 LAB — RAD ONC ARIA SESSION SUMMARY
Course Elapsed Days: 12
Plan Fractions Treated to Date: 9
Plan Prescribed Dose Per Fraction: 2.5 Gy
Plan Total Fractions Prescribed: 10
Plan Total Prescribed Dose: 25 Gy
Reference Point Dosage Given to Date: 22.5 Gy
Reference Point Session Dosage Given: 2.5 Gy
Session Number: 9

## 2022-09-03 ENCOUNTER — Encounter: Payer: Self-pay | Admitting: Urology

## 2022-09-03 ENCOUNTER — Ambulatory Visit
Admission: RE | Admit: 2022-09-03 | Discharge: 2022-09-03 | Disposition: A | Discharge status: Home / Self Care | Payer: Medicare Other | Source: Ambulatory Visit | Attending: Radiation Oncology | Admitting: Radiation Oncology

## 2022-09-03 ENCOUNTER — Other Ambulatory Visit: Payer: Self-pay

## 2022-09-03 DIAGNOSIS — Z510 Encounter for antineoplastic radiation therapy: Secondary | ICD-10-CM | POA: Diagnosis not present

## 2022-09-03 DIAGNOSIS — C3492 Malignant neoplasm of unspecified part of left bronchus or lung: Secondary | ICD-10-CM | POA: Diagnosis not present

## 2022-09-03 DIAGNOSIS — F17210 Nicotine dependence, cigarettes, uncomplicated: Secondary | ICD-10-CM | POA: Diagnosis not present

## 2022-09-03 DIAGNOSIS — Z923 Personal history of irradiation: Secondary | ICD-10-CM

## 2022-09-03 DIAGNOSIS — C3412 Malignant neoplasm of upper lobe, left bronchus or lung: Secondary | ICD-10-CM | POA: Diagnosis not present

## 2022-09-03 DIAGNOSIS — Z2989 Encounter for other specified prophylactic measures: Secondary | ICD-10-CM | POA: Diagnosis not present

## 2022-09-03 HISTORY — DX: Personal history of irradiation: Z92.3

## 2022-09-03 LAB — RAD ONC ARIA SESSION SUMMARY
Course Elapsed Days: 13
Plan Fractions Treated to Date: 10
Plan Prescribed Dose Per Fraction: 2.5 Gy
Plan Total Fractions Prescribed: 10
Plan Total Prescribed Dose: 25 Gy
Reference Point Dosage Given to Date: 25 Gy
Reference Point Session Dosage Given: 2.5 Gy
Session Number: 10

## 2022-09-04 ENCOUNTER — Other Ambulatory Visit: Payer: Self-pay | Admitting: Cardiovascular Disease

## 2022-09-04 DIAGNOSIS — E782 Mixed hyperlipidemia: Secondary | ICD-10-CM

## 2022-09-08 DIAGNOSIS — E7800 Pure hypercholesterolemia, unspecified: Secondary | ICD-10-CM | POA: Diagnosis not present

## 2022-09-08 DIAGNOSIS — E1169 Type 2 diabetes mellitus with other specified complication: Secondary | ICD-10-CM | POA: Diagnosis not present

## 2022-09-08 DIAGNOSIS — I10 Essential (primary) hypertension: Secondary | ICD-10-CM | POA: Diagnosis not present

## 2022-09-30 ENCOUNTER — Ambulatory Visit
Admission: RE | Admit: 2022-09-30 | Discharge: 2022-09-30 | Disposition: A | Discharge status: Home / Self Care | Payer: Medicare Other | Source: Ambulatory Visit | Attending: Internal Medicine | Admitting: Internal Medicine

## 2022-09-30 NOTE — Progress Notes (Addendum)
  Radiation Oncology         [phone]) [phone] ________________________________  Name: SYDNEY AZURE MRN: [id number]  Date of Service: 09/30/2022  DOB: September 23, 1951  Post Treatment Telephone Note  Diagnosis:  70 yo male with NSCLC, squamous cell carcinoma involving the LLL lung and limited stage small cell carcinoma in an 11L node. (as documented in provider EOT note)   The patient was available for call today.   Symptoms of fatigue have improved since completing therapy.  Symptoms of skin changes have improved since completing therapy.  Symptoms of esophagitis have improved since completing therapy.  Patient's current complaints include reduced appetite, nausea, and vomiting managed w/ Compazine 10mg .  The patient has scheduled follow up with his medical oncologist Dr. Julien Nordmann on 10/20/2022 for ongoing care, and was encouraged to call if he  develops concerns or questions regarding radiation.  This concludes the interview.   Leandra Kern, LPN

## 2022-10-16 ENCOUNTER — Inpatient Hospital Stay: Payer: Medicare Other | Attending: Internal Medicine

## 2022-10-16 DIAGNOSIS — C3432 Malignant neoplasm of lower lobe, left bronchus or lung: Secondary | ICD-10-CM | POA: Diagnosis not present

## 2022-10-16 DIAGNOSIS — Z923 Personal history of irradiation: Secondary | ICD-10-CM | POA: Insufficient documentation

## 2022-10-16 DIAGNOSIS — Z9221 Personal history of antineoplastic chemotherapy: Secondary | ICD-10-CM | POA: Insufficient documentation

## 2022-10-16 DIAGNOSIS — C3490 Malignant neoplasm of unspecified part of unspecified bronchus or lung: Secondary | ICD-10-CM

## 2022-10-16 LAB — CBC WITH DIFFERENTIAL (CANCER CENTER ONLY)
Abs Immature Granulocytes: 0.02 10*3/uL (ref 0.00–0.07)
Basophils Absolute: 0 10*3/uL (ref 0.0–0.1)
Basophils Relative: 1 %
Eosinophils Absolute: 0.1 10*3/uL (ref 0.0–0.5)
Eosinophils Relative: 2 %
HCT: 38.2 % — ABNORMAL LOW (ref 39.0–52.0)
Hemoglobin: 13 g/dL (ref 13.0–17.0)
Immature Granulocytes: 0 %
Lymphocytes Relative: 16 %
Lymphs Abs: 1 10*3/uL (ref 0.7–4.0)
MCH: 32.2 pg (ref 26.0–34.0)
MCHC: 34 g/dL (ref 30.0–36.0)
MCV: 94.6 fL (ref 80.0–100.0)
Monocytes Absolute: 0.7 10*3/uL (ref 0.1–1.0)
Monocytes Relative: 12 %
Neutro Abs: 4.3 10*3/uL (ref 1.7–7.7)
Neutrophils Relative %: 69 %
Platelet Count: 278 10*3/uL (ref 150–400)
RBC: 4.04 MIL/uL — ABNORMAL LOW (ref 4.22–5.81)
RDW: 13.6 % (ref 11.5–15.5)
WBC Count: 6.2 10*3/uL (ref 4.0–10.5)
nRBC: 0 % (ref 0.0–0.2)

## 2022-10-16 LAB — CMP (CANCER CENTER ONLY)
ALT: 19 U/L (ref 0–44)
AST: 14 U/L — ABNORMAL LOW (ref 15–41)
Albumin: 4.2 g/dL (ref 3.5–5.0)
Alkaline Phosphatase: 75 U/L (ref 38–126)
Anion gap: 8 (ref 5–15)
BUN: 12 mg/dL (ref 8–23)
CO2: 26 mmol/L (ref 22–32)
Calcium: 9.8 mg/dL (ref 8.9–10.3)
Chloride: 105 mmol/L (ref 98–111)
Creatinine: 0.87 mg/dL (ref 0.61–1.24)
GFR, Estimated: >60 mL/min (ref >60)
Glucose, Bld: 111 mg/dL — ABNORMAL HIGH (ref 70–99)
Potassium: 4.2 mmol/L (ref 3.5–5.1)
Sodium: 139 mmol/L (ref 135–145)
Total Bilirubin: 0.3 mg/dL (ref 0.3–1.2)
Total Protein: 6.8 g/dL (ref 6.5–8.1)

## 2022-10-17 ENCOUNTER — Ambulatory Visit (HOSPITAL_COMMUNITY)
Admission: RE | Admit: 2022-10-17 | Discharge: 2022-10-17 | Disposition: A | Discharge status: Home / Self Care | Payer: Medicare Other | Source: Ambulatory Visit | Attending: Internal Medicine | Admitting: Internal Medicine

## 2022-10-17 ENCOUNTER — Encounter (HOSPITAL_COMMUNITY): Payer: Self-pay

## 2022-10-17 DIAGNOSIS — J439 Emphysema, unspecified: Secondary | ICD-10-CM | POA: Diagnosis not present

## 2022-10-17 DIAGNOSIS — C3490 Malignant neoplasm of unspecified part of unspecified bronchus or lung: Secondary | ICD-10-CM | POA: Diagnosis not present

## 2022-10-17 DIAGNOSIS — R911 Solitary pulmonary nodule: Secondary | ICD-10-CM | POA: Diagnosis not present

## 2022-10-17 MED ORDER — SODIUM CHLORIDE (PF) 0.9 % IJ SOLN
INTRAMUSCULAR | Status: AC
  Filled 2022-10-17: qty 50

## 2022-10-17 MED ORDER — IOHEXOL 300 MG/ML  SOLN
75.0000 mL | Freq: Once | INTRAMUSCULAR | Status: AC | PRN
  Administered 2022-10-17: 75 mL via INTRAVENOUS

## 2022-10-20 ENCOUNTER — Inpatient Hospital Stay (HOSPITAL_BASED_OUTPATIENT_CLINIC_OR_DEPARTMENT_OTHER): Payer: Medicare Other | Admitting: Internal Medicine

## 2022-10-20 VITALS — BP 107/68 | HR 75 | Temp 98.0°F | Resp 16 | Wt 194.1 lb

## 2022-10-20 DIAGNOSIS — C3492 Malignant neoplasm of unspecified part of left bronchus or lung: Secondary | ICD-10-CM

## 2022-10-20 DIAGNOSIS — Z9221 Personal history of antineoplastic chemotherapy: Secondary | ICD-10-CM | POA: Diagnosis not present

## 2022-10-20 DIAGNOSIS — Z923 Personal history of irradiation: Secondary | ICD-10-CM | POA: Diagnosis not present

## 2022-10-20 DIAGNOSIS — C3432 Malignant neoplasm of lower lobe, left bronchus or lung: Secondary | ICD-10-CM | POA: Diagnosis not present

## 2022-10-20 NOTE — Progress Notes (Signed)
[place] [phone]   [phone]  OFFICE PROGRESS NOTE  [name], MD [place] [id number]  DIAGNOSIS:  limited stage (T1c, N2, M0) small cell lung cancer with mixture of squamous cell carcinoma in the left upper lobe presented with left upper lobe lung nodule in addition to left hilar and mediastinal lymphadenopathy diagnosed in May 2023.  PRIOR THERAPY:  1) Systemic chemotherapy with cisplatin 75 Mg/M2 on day 1 and etoposide 100 Mg/M2 on days 1, 2 and 3 every 3 weeks for 4 cycles.  This was concurrent with radiotherapy for around 6 weeks during this course of treatment.  Status post 4 cycles.  Last cycle was given on 06/09/2022. 2) prophylactic cranial irradiation under the care of Dr. Tammi Klippel.  CURRENT THERAPY: Observation.  INTERVAL HISTORY: Francisco Austin 71 y.o. male returns to the clinic today for follow-up visit accompanied by his wife.  The patient is feeling fine today with no concerning complaints.  The patient is feeling fine today with no concerning complaints except for fatigue.  He is increasing his activity slowly and he was able to play golf recently.  He denied having any current chest pain, shortness of breath, cough or hemoptysis.  He has no nausea, vomiting, diarrhea or constipation.  He has no headache or visual changes.  He denied having any weight loss or night sweats.  He is here today for evaluation with repeat CT scan of the chest for restaging of his disease.  MEDICAL HISTORY: Past Medical History:  Diagnosis Date   Anxiety    Back pain    CAD (coronary artery disease)    COPD (chronic obstructive pulmonary disease) (HCC)    History of kidney stones    HLD (hyperlipidemia)    HTN (hypertension)    Pre-diabetes     ALLERGIES:  is allergic to gabapentin, morphine, other, prednisone, alprazolam, fentanyl, niacin, and penicillamine.  MEDICATIONS:  Current Outpatient Medications   Medication Sig Dispense Refill   albuterol (VENTOLIN HFA) 108 (90 Base) MCG/ACT inhaler Inhale 2 puffs into the lungs every 6 (six) hours as needed for wheezing or shortness of breath.     aspirin EC 81 MG tablet Take 81 mg by mouth daily.     atorvastatin (LIPITOR) 80 MG tablet Take 1 tablet (80 mg total) by mouth daily. 90 tablet 1   Cholecalciferol 50 MCG (2000 UT) TABS Take 2,000 Units by mouth daily.     clopidogrel (PLAVIX) 75 MG tablet Take 75 mg by mouth daily.     diclofenac sodium (VOLTAREN) 1 % GEL Apply 1 g topically daily as needed (pain).     diltiazem (TIAZAC) 240 MG 24 hr capsule Take 240 mg by mouth daily.     esomeprazole (NEXIUM) 40 MG capsule Take 40 mg by mouth daily.     guaiFENesin (ROBITUSSIN) 100 MG/5ML liquid Take 10 mLs by mouth every 4 (four) hours as needed for cough or to loosen phlegm. 473 mL 5   HYDROcodone-acetaminophen (NORCO) 10-325 MG tablet Take 1-2 tablets by mouth every 6 (six) hours as needed.     LORazepam (ATIVAN) 1 MG tablet Take 1 mg by mouth 3 (three) times daily.     nitroGLYCERIN (NITROSTAT) 0.4 MG SL tablet Place 0.4 mg under the tongue every 5 (five) minutes as needed for chest pain.     prochlorperazine (COMPAZINE) 10 MG tablet Take 1 tablet (10 mg total) by mouth every  6 (six) hours as needed. 30 tablet 2   telmisartan (MICARDIS) 80 MG tablet Take 80 mg by mouth daily.     umeclidinium-vilanterol (ANORO ELLIPTA) 62.5-25 MCG/INH AEPB Inhale 1 puff into the lungs daily.     vitamin B-12 (CYANOCOBALAMIN) 500 MCG tablet Take 500 mcg by mouth daily.     No current facility-administered medications for this visit.   Facility-Administered Medications Ordered in Other Visits  Medication Dose Route Frequency Provider Last Rate Last Admin   prochlorperazine (COMPAZINE) 10 MG/2ML injection             SURGICAL HISTORY:  Past Surgical History:  Procedure Laterality Date   APPENDECTOMY     CORONARY STENT INTERVENTION N/A 12/24/2021   Procedure:  CORONARY STENT INTERVENTION;  Surgeon: Jettie Booze, MD;  Location: [place] [facility];  Service: Cardiovascular;  Laterality: N/A;   HEMORROIDECTOMY     INTRAVASCULAR ULTRASOUND/IVUS N/A 12/24/2021   Procedure: Intravascular Ultrasound/IVUS;  Surgeon: Jettie Booze, MD;  Location: [place];  Service: Cardiovascular;  Laterality: N/A;   LEFT HEART CATH AND CORONARY ANGIOGRAPHY N/A 12/23/2021   Procedure: LEFT HEART CATH AND CORONARY ANGIOGRAPHY;  Surgeon: Lorretta Harp, MD;  Location: [facility];  Service: Cardiovascular;  Laterality: N/A;   TONSILLECTOMY     VIDEO BRONCHOSCOPY WITH ENDOBRONCHIAL ULTRASOUND N/A 03/12/2022   Procedure: VIDEO BRONCHOSCOPY WITH ENDOBRONCHIAL ULTRASOUND;  Surgeon: Melrose Nakayama, MD;  Location: [facility];  Service: Thoracic;  Laterality: N/A;    REVIEW OF SYSTEMS:  A comprehensive review of systems was negative except for: Constitutional: positive for fatigue   PHYSICAL EXAMINATION: General appearance: alert, cooperative, fatigued, and no distress Head: Normocephalic, without obvious abnormality, atraumatic Neck: no adenopathy, no JVD, supple, symmetrical, trachea midline, and thyroid not enlarged, symmetric, no tenderness/mass/nodules Lymph nodes: Cervical, supraclavicular, and axillary nodes normal. Resp: clear to auscultation bilaterally Back: symmetric, no curvature. ROM normal. No CVA tenderness. Cardio: regular rate and rhythm, S1, S2 normal, no murmur, click, rub or gallop GI: soft, non-tender; bowel sounds normal; no masses,  no organomegaly Extremities: extremities normal, atraumatic, no cyanosis or edema  ECOG PERFORMANCE STATUS: 1 - Symptomatic but completely ambulatory  Blood pressure 105/67, pulse 88, temperature 97.9 F (36.6 C), temperature source Oral, resp. rate 15, weight 194 lb 12.8 oz (88.4 kg), SpO2 96 %.  LABORATORY DATA: Lab Results  Component Value Date   WBC 7.5 07/21/2022   HGB 10.6 (L)  07/21/2022   HCT 31.8 (L) 07/21/2022   MCV 98.5 07/21/2022   PLT 323 07/21/2022      Chemistry      Component Value Date/Time   NA 134 (L) 06/16/2022 1246   NA 140 12/13/2021 0900   K 4.4 06/16/2022 1246   CL 104 06/16/2022 1246   CO2 27 06/16/2022 1246   BUN 19 06/16/2022 1246   BUN 9 12/13/2021 0900   CREATININE 1.11 06/16/2022 1246      Component Value Date/Time   CALCIUM 8.9 06/16/2022 1246   ALKPHOS 74 06/16/2022 1246   AST 14 (L) 06/16/2022 1246   ALT 15 06/16/2022 1246   BILITOT 0.3 06/16/2022 1246       RADIOGRAPHIC STUDIES: CT Chest W Contrast  Result Date: 10/17/2022 CLINICAL DATA:  Small cell left lung cancer status post chemotherapy and radiation therapy. Restaging. * Tracking Code: BO * EXAM: CT CHEST WITH CONTRAST TECHNIQUE: Multidetector CT imaging of the chest was performed during intravenous contrast administration. RADIATION DOSE REDUCTION:  This exam was performed according to the departmental dose-optimization program which includes automated exposure control, adjustment of the mA and/or kV according to patient size and/or use of iterative reconstruction technique. CONTRAST:  59mL OMNIPAQUE IOHEXOL 300 MG/ML  SOLN COMPARISON:  06/26/2022 chest CT.  02/26/2022 PET-CT. FINDINGS: Cardiovascular: Normal heart size. Stable lipomatous hypertrophy of the interatrial septum. No significant pericardial effusion/thickening. Three-vessel coronary atherosclerosis. Atherosclerotic nonaneurysmal thoracic aorta. Normal caliber pulmonary arteries. No central pulmonary emboli. Mediastinum/Nodes: No significant thyroid nodules. Unremarkable esophagus. No axillary adenopathy. No recurrent pathologically enlarged thoracic or hilar lymph nodes. Stable top-normal 0.8 cm short axis diameter left paratracheal node (series 2/image 64). Lungs/Pleura: No pneumothorax. No pleural effusion. Moderate paraseptal and centrilobular emphysema with mild diffuse bronchial wall thickening. No acute  consolidative airspace disease. Previously described mixed cystic, ground-glass and solid anterior left upper lobe pulmonary nodule is increasingly indistinct, now 2.1 x 1.4 cm with 0.3 cm solid component (series 7/image 59), previously 2.2 x 1.6 cm with 0.3 cm solid component, stable to slightly decreased. No new significant pulmonary nodules. Upper abdomen: Stable generalized bilateral adrenal hyperplasia without discrete nodules. Nonobstructing 4 mm upper right renal stone. Musculoskeletal: No aggressive appearing focal osseous lesions. Mild thoracic spondylosis. IMPRESSION: 1. No new or progressive metastatic disease in the chest. No recurrent thoracic adenopathy. 2. Previously described mixed cystic, ground-glass and solid anterior left upper lobe pulmonary nodule is increasingly indistinct, now 2.1 x 1.4 cm with 0.3 cm solid component, stable to slightly decreased. Continued chest CT surveillance warranted for this nodule. 3. Three-vessel coronary atherosclerosis. 4. Nonobstructing right nephrolithiasis. 5. Aortic Atherosclerosis (ICD10-I70.0) and Emphysema (ICD10-J43.9). Electronically Signed   By: Ilona Sorrel M.D.   On: 10/17/2022 16:55      ASSESSMENT AND PLAN: This is a very pleasant 71 years old white male diagnosed with limited stage (T1c, N2, M0) small cell lung cancer with mixture of squamous cell carcinoma in the left upper lobe presented with left upper lobe lung nodule in addition to left hilar and mediastinal lymphadenopathy diagnosed in May 2023.  The patient underwent systemic chemotherapy with cisplatin 75 Mg/M2 on day 1 and etoposide 100 Mg/M2 on days 1, 2 and 3 status post 4 cycles.  This is concurrent with radiotherapy.  Last dose of chemotherapy was on 06/09/2022.  This was followed by prophylactic cranial irradiation. The patient tolerated the previous course of his treatment well except for fatigue. He is currently on observation and feeling fine with no concerning complaints except for  the fatigue. He had repeat CT scan of the chest performed recently.  I personally and independently reviewed the scan and discussed the results with the patient and his wife today. His scan showed no concerning findings for disease progression but he has a cystic lesion in the left upper lobe that need close monitoring. I recommended for him to continue on observation with repeat CT scan of the chest in 3 months. The patient was advised to call immediately if he has any other concerning symptoms in the interval. The patient voices understanding of current disease status and treatment options and is in agreement with the current care plan.  All questions were answered. The patient knows to call the clinic with any problems, questions or concerns. We can certainly see the patient much sooner if necessary.  The total time spent in the appointment was 30 minutes.  Disclaimer: This note was dictated with voice recognition software. Similar sounding words can inadvertently be transcribed and may not be corrected upon  review.

## 2022-10-22 DIAGNOSIS — H26491 Other secondary cataract, right eye: Secondary | ICD-10-CM | POA: Diagnosis not present

## 2022-10-22 DIAGNOSIS — Z961 Presence of intraocular lens: Secondary | ICD-10-CM | POA: Diagnosis not present

## 2022-10-22 DIAGNOSIS — H5203 Hypermetropia, bilateral: Secondary | ICD-10-CM | POA: Diagnosis not present

## 2022-10-22 DIAGNOSIS — H524 Presbyopia: Secondary | ICD-10-CM | POA: Diagnosis not present

## 2022-11-19 DIAGNOSIS — H26491 Other secondary cataract, right eye: Secondary | ICD-10-CM | POA: Diagnosis not present

## 2022-12-02 DIAGNOSIS — H906 Mixed conductive and sensorineural hearing loss, bilateral: Secondary | ICD-10-CM | POA: Diagnosis not present

## 2022-12-02 DIAGNOSIS — H6993 Unspecified Eustachian tube disorder, bilateral: Secondary | ICD-10-CM | POA: Diagnosis not present

## 2022-12-03 ENCOUNTER — Other Ambulatory Visit: Payer: Self-pay | Admitting: Cardiology

## 2022-12-03 DIAGNOSIS — I10 Essential (primary) hypertension: Secondary | ICD-10-CM | POA: Diagnosis not present

## 2022-12-03 DIAGNOSIS — E7800 Pure hypercholesterolemia, unspecified: Secondary | ICD-10-CM | POA: Diagnosis not present

## 2022-12-03 DIAGNOSIS — E1169 Type 2 diabetes mellitus with other specified complication: Secondary | ICD-10-CM | POA: Diagnosis not present

## 2022-12-03 DIAGNOSIS — C3490 Malignant neoplasm of unspecified part of unspecified bronchus or lung: Secondary | ICD-10-CM | POA: Diagnosis not present

## 2022-12-11 DIAGNOSIS — H6983 Other specified disorders of Eustachian tube, bilateral: Secondary | ICD-10-CM | POA: Diagnosis not present

## 2022-12-25 ENCOUNTER — Telehealth: Payer: Self-pay | Admitting: Internal Medicine

## 2022-12-25 NOTE — Telephone Encounter (Signed)
Called patient regarding upcoming March appointment, patient is notified.

## 2023-01-08 NOTE — Progress Notes (Addendum)
Cardiology Office Note:    Date:  01/21/2023   ID:  Francisco Austin, DOB Jan 27, 1951, MRN [id number]  PCP:  [name], MD  Cardiologist:  Quay Burow, MD  Electrophysiologist:  None   Referring MD: [name], MD   Chief Complaint: routine follow-up of CAD  History of Present Illness:    Francisco Austin is a 72 y.o. male with a history of CAD s/p DES x2 to RCA and DES x1 to LAD in 12/2021, hypertension, hyperlipidemia, pre-diabetes, COPD, chronic back pain,  lung cancer, and tobacco abuse who is followed by Dr. Gwenlyn Found and presents today for routine follow-up.   Patient was formerly followed by Dr. [name] but has followed with Dr. Gwenlyn Found since 07/2019. He has a long history of CAD and underwent multiple cardiac catheterizations remote by Dr. [name] with PCI around the year 2000 but no stenting. 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 Coronary CTA was ordered in 12/2021 for further evaluation of atypical chest pain and showed a coronary calcium score of 1,970 (93rd percentile for age and sex) and moderate to severe multivessel CAD. This led to a cardiac catheterization on 12/23/2021 which showed 90% stenosis of proximal LAD and 80% stenosis of proximal to mid RCA. LVEF wa 55-65% by visual estimate. He was brought back to the cath lab the next day and underwent orbital atherectomy of the proxima to mid RCA with overlapping DES x2 as well as DES of the LAD.   Patient was last seen by Dr. 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 He reported occasional atypical chest pain but was overall stable from a cardiac standpoint.  Patient presents today for follow-up. He is overall stable from a cardiac standpoint. He continues to have his chronic sharp chest pain that occurs intermittently and last for about 10 seconds and then resolves. This occurs  at both rest and with exertion. Sublingual Nitroglycerin does tend to help but causes him to have a headache. He states this is the same pain that he has had since 1996 and is due to coronary spasms. It has improved since he had his most recent PCI in 12/2021. He denies any increasing frequency or intensity. He denies any significant shortness of breath. No orthopnea or PND. He has some chronic lower extremity edema but states this looks better than usual. He denies any palpitations.  He reports occasional mild lightheadedness/dizziness but no syncope. He thinks he has finished chemo and radiation for his lung caner.  Past Medical History:  Diagnosis Date   Anxiety    Back pain    CAD (coronary artery disease)    COPD (chronic obstructive pulmonary disease) (HCC)    History of kidney stones    HLD (hyperlipidemia)    HTN (hypertension)    Pre-diabetes     Past Surgical History:  Procedure Laterality Date   APPENDECTOMY     CORONARY STENT INTERVENTION N/A 12/24/2021   Procedure: CORONARY STENT INTERVENTION;  Surgeon: Jettie Booze, MD;  Location: [facility];  Service: Cardiovascular;  Laterality: N/A;   HEMORROIDECTOMY     INTRAVASCULAR ULTRASOUND/IVUS N/A 12/24/2021   Procedure: Intravascular Ultrasound/IVUS;  Surgeon: Jettie Booze, MD;  Location: [facility];  Service: Cardiovascular;  Laterality: N/A;   LEFT HEART CATH AND CORONARY ANGIOGRAPHY N/A 12/23/2021   Procedure: LEFT HEART  CATH AND CORONARY ANGIOGRAPHY;  Surgeon: Lorretta Harp, MD;  Location: [facility];  Service: Cardiovascular;  Laterality: N/A;   TONSILLECTOMY     VIDEO BRONCHOSCOPY WITH ENDOBRONCHIAL ULTRASOUND N/A 03/12/2022   Procedure: VIDEO BRONCHOSCOPY WITH ENDOBRONCHIAL ULTRASOUND;  Surgeon: Melrose Nakayama, MD;  Location: [facility];  Service: Thoracic;  Laterality: N/A;    Current Medications: Current Meds  Medication Sig   albuterol (VENTOLIN HFA) 108 (90 Base) MCG/ACT  inhaler Inhale 2 puffs into the lungs every 6 (six) hours as needed for wheezing or shortness of breath.   aspirin EC 81 MG tablet Take 81 mg by mouth daily.   atorvastatin (LIPITOR) 80 MG tablet Take 1 tablet (80 mg total) by mouth daily. Please schedule appointment for additional refills.   Cholecalciferol 50 MCG (2000 UT) TABS Take 2,000 Units by mouth daily.   clopidogrel (PLAVIX) 75 MG tablet TAKE ONE TABLET BY MOUTH ONCE DAILY WITH BREAKFAST   diclofenac sodium (VOLTAREN) 1 % GEL Apply 1 g topically daily as needed (pain).   diltiazem (TIAZAC) 240 MG 24 hr capsule Take 240 mg by mouth daily.   esomeprazole (NEXIUM) 40 MG capsule Take 40 mg by mouth daily.   guaiFENesin (ROBITUSSIN) 100 MG/5ML liquid Take 10 mLs by mouth every 4 (four) hours as needed for cough or to loosen phlegm.   HYDROcodone-acetaminophen (NORCO) 10-325 MG tablet Take 1-2 tablets by mouth every 6 (six) hours as needed.   LORazepam (ATIVAN) 1 MG tablet Take 1 mg by mouth 3 (three) times daily.   nitroGLYCERIN (NITROSTAT) 0.4 MG SL tablet Place 0.4 mg under the tongue every 5 (five) minutes as needed for chest pain.   prochlorperazine (COMPAZINE) 10 MG tablet TAKE ONE TABLET BY MOUTH EVERY 6 HOURS AS NEEDED   telmisartan (MICARDIS) 80 MG tablet Take 80 mg by mouth daily.   umeclidinium-vilanterol (ANORO ELLIPTA) 62.5-25 MCG/INH AEPB Inhale 1 puff into the lungs daily.   vitamin B-12 (CYANOCOBALAMIN) 500 MCG tablet Take 500 mcg by mouth daily.     Allergies:   Gabapentin, Morphine, Other, Prednisone, Alprazolam, Fentanyl, Niacin, and Penicillamine   Social History   Socioeconomic History   Marital status: Married    Spouse name: Not on file   Number of children: Not on file   Years of education: Not on file   Highest education level: Not on file  Occupational History   Not on file  Tobacco Use   Smoking status: Every Day    Types: Pipe   Smokeless tobacco: Never  Vaping Use   Vaping Use: Never used   Substance and Sexual Activity   Alcohol use: Never   Drug use: Never   Sexual activity: Not Currently  Other Topics Concern   Not on file  Social History Narrative   Not on file   Social Determinants of Health   Financial Resource Strain: Not on file  Food Insecurity: Not on file  Transportation Needs: Not on file  Physical Activity: Not on file  Stress: Not on file  Social Connections: Not on file     Family History: The patient's family history includes Cancer in his father; Heart failure in his mother.  ROS:   Please see the history of present illness.     EKGs/Labs/Other Studies Reviewed:    The following studies were reviewed:  Coronary CTA 12/16/2021: Impression: 1. Moderate to severe multivessel CAD, CADRADS = 4a. CT FFR will be performed and reported separately. 2. Coronary calcium score  of 1970. This was 93rd percentile for age and sex matched control. 3. Normal coronary origin with right dominance. 4. Dilated main pulmonary artery at 31 mm, suggestive of pulmonary hypertension. 5. Posterior mitral annular calcification. 6. Lipomatous hypertrophy of the interatrial septum. 7. Aortic atherosclerosis. 8. Definitive cardiac catheterization is recommended. _______________  Left Cardiac Catheterization 12/23/2021:   Prox LAD lesion is 90% stenosed.   Prox RCA to Mid RCA lesion is 80% stenosed.   The left ventricular systolic function is normal.   LV end diastolic pressure is normal.   The left ventricular ejection fraction is 55-65% by visual estimate.  Impressions: Mr. Hark has two-vessel disease with a 90% fairly focal proximal LAD stenosis at the takeoff of the first diagonal branch which I think can be fairly easily fixed with balloon and stenting. He also has a more complex proximal to mid RCA which is highly calcified and probably should best be treated with orbital atherectomy followed by PCI drug-eluting stenting. His circumflex has minimal disease.  His LV function is normal. My TIG catheter dipped into his LV and most likely initiated a fast tachycardia which resolved when I withdrew the catheter. The patient's baseline EKG changed from narrow complex to appears to be a bundle branch block. He did develop chest pain with this and relative hypotension with systolic blood pressure in the 90s. Based on this, I elected not to proceed with intervention but rather will watch him overnight and arrange for Dr. Irish Lack to revascularize tomorrow. I did load him with 600 mg of Plavix and placed him on aspirin and clopidogrel in anticipation of his procedure. The sheath was removed and a TR band was placed on the right wrist to achieve patent hemostasis. The patient left lab in stable condition   _______________  Coronary Stent Intervention 12/24/2021:   Prox LAD lesion is 90% stenosed.   A drug-eluting stent was successfully placed using a STENT ONYX FRONTIER 3.5X12 and optimized with intravascular ultrasound.   Post intervention, there is a 0% residual stenosis.   Prox RCA to Mid RCA lesion is 80% stenosed.   Mid RCA lesion is 75% stenosed.  The RCA disease was treated with orbital atherectomy in the proximal to mid vessel   A drug-eluting stent was successfully placed using a SYNERGY XD 4.0X38 distally.   A drug-eluting stent was successfully placed using a SYNERGY XD 3.50X24 overlapping proximally.  There appeared to be some dye behind the stent.  This was confirmed by intravascular ultrasound.  Echocardiogram did not show any perforation.   Post intervention, there is a 0% residual stenosis.   Post intervention, there is a 0% residual stenosis.   Complex intervention of the RCA.  Successful PCI of the LAD.  He will need dual antiplatelet therapy for at least 6 months.  I would strongly consider lifelong clopidogrel monotherapy given the calcific disease he has in his right coronary artery.  Continue aggressive secondary  prevention.  Diagnostic Dominance: Right  Intervention    _______________  Echocardiogram 12/24/2021: Conclusion(s)/Recommendation(s): Limited periprocedural study shows no  evidence of pericardial effusion.   EKG:  EKG ordered today. EKG personally reviewed and demonstrates: Normal sinus rhythm with sinus arrhythmia, rate 63 bpm, and no acute ST/T changes. Normal axis. 1st degree AV block. QTc 440 ms.  Recent Labs: 07/21/2022: Magnesium 1.9 01/15/2023: ALT 14; BUN 10; Creatinine 0.77; Hemoglobin 11.6; Platelet Count 285; Potassium 4.0; Sodium 141  Recent Lipid Panel    Component Value Date/Time   CHOL  115 03/03/2022 0931   TRIG 67 03/03/2022 0931   HDL 40 03/03/2022 0931   CHOLHDL 2.9 03/03/2022 0931   LDLCALC 61 03/03/2022 0931    Physical Exam:    Vital Signs: BP (!) 91/55 (BP Location: Left Arm, Patient Position: Sitting, Cuff Size: Normal)   Pulse 63   Ht 5\' 11"  (1.803 m)   Wt 167 lb (75.8 kg)   BMI 23.29 kg/m     Wt Readings from Last 3 Encounters:  01/21/23 167 lb (75.8 kg)  01/19/23 168 lb (76.2 kg)  10/20/22 194 lb 2 oz (88.1 kg)     General: 72 y.o.  male in no acute distress. HEENT: Normocephalic and atraumatic. Sclera clear.  Neck: Supple. No carotid bruits. No JVD. Heart: Slightly irregular with normal rate. Distinct S1 and S2. No murmurs, gallops, or rubs. Radial pulses 2+ and equal bilaterally. Lungs: No increased work of breathing. Clear to ausculation bilaterally. No wheezes, rhonchi, or rales.  Abdomen: Soft, non-distended, and non-tender to palpation.  Extremities: Trace lower extremity edema bilaterally.  Skin: Warm and dry. Neuro: Alert and oriented x3. No focal deficits. Psych: Normal affect. Responds appropriately.  Assessment:    1. Coronary artery disease involving native coronary artery of native heart without angina pectoris   2. Primary hypertension   3. Hyperlipidemia, unspecified hyperlipidemia type   4. Tobacco abuse     Plan:     Chronic Chest Pain CAD S/p orbital atherectomy and overlapping DES x2 to RCA as well as DES to LAD in 12/2021.  - Patient has chronic sharp chest pain that he states last for 10 seconds and then resolves. He has had this since 1996 and states it is stable. He states it is from coronary spasms. He denies any increase in frequency or intensity. - Considered adding Imdur but patient states he has a history of migraines and gets headaches with Nitroglycerin. Also discussed stopping Diltiazem and adding Amlodipine to help with potential spasms but patient declined (he states symptoms are stable so he would like to continues current medications).  - Continue DAPT with Aspirin and Plavix for now. He is now >1 year after PCI; therefore, will discuss with Dr. Gwenlyn Found on whether Plavix can be stopped.  - Continue high-intensity statin.  Hypertension BP soft at 91/55 today. BP usually not this low at home. Patient asymptomatic at this. - Continue currently medications: Telmisartan 80mg  daily and Diltiazem 240mg  daily.   Hyperlipidemia Last lipid panel in 07/2022: Total Cholesterol 131, Triglycerides 128, HDL 42, LDL 66. LDL goal <70 given CAD.  - Continue Lipitor 80mg  daily.   Tobacco Abuse Patient continues to occasional smoke his "pipe." - Dicussed the importance of complete cessation.   Disposition: Follow up in 6 months.  ADDENDUM 01/23/2023: Discussed DAPT with Dr. Gwenlyn Found. He recommended continuing patient on DAPT with Aspirin and Plavix. Will send patient a MyChart message with Dr. Kennon Holter recommendation.    Medication Adjustments/Labs and Tests Ordered: Current medicines are reviewed at length with the patient today.  Concerns regarding medicines are outlined above.  Orders Placed This Encounter  Procedures   EKG 12-Lead   No orders of the defined types were placed in this encounter.   Patient Instructions  Medication Instructions:   Your physician recommends that you continue on  your current medications as directed. Please refer to the Current Medication list given to you today.  *If you need a refill on your cardiac medications before your next appointment, please call your pharmacy*  Lab Work: NONE ordered at this time of appointment   If you have labs (blood work) drawn today and your tests are completely normal, you will receive your results only by: [place] (if you have MyChart) OR A paper copy in the mail If you have any lab test that is abnormal or we need to change your treatment, we will call you to review the results.  Testing/Procedures: NONE ordered at this time of appointment   Follow-Up: At [facility], you and your health needs are our priority.  As part of our continuing mission to provide you with exceptional heart care, we have created designated Provider Care Teams.  These Care Teams include your primary Cardiologist (physician) and Advanced Practice Providers (APPs -  Physician Assistants and Nurse Practitioners) who all work together to provide you with the care you need, when you need it.  Your next appointment:   6 month(s)  Provider:   Quay Burow, MD     Other Instructions     Signed, Darreld Mclean, PA-C  01/21/2023 2:41 PM    [place]

## 2023-01-15 ENCOUNTER — Inpatient Hospital Stay: Payer: Medicare Other | Attending: Internal Medicine

## 2023-01-15 ENCOUNTER — Ambulatory Visit (HOSPITAL_COMMUNITY)
Admission: RE | Admit: 2023-01-15 | Discharge: 2023-01-15 | Disposition: A | Discharge status: Home / Self Care | Payer: Medicare Other | Source: Ambulatory Visit | Attending: Internal Medicine | Admitting: Internal Medicine

## 2023-01-15 DIAGNOSIS — Z923 Personal history of irradiation: Secondary | ICD-10-CM | POA: Diagnosis not present

## 2023-01-15 DIAGNOSIS — C3432 Malignant neoplasm of lower lobe, left bronchus or lung: Secondary | ICD-10-CM | POA: Insufficient documentation

## 2023-01-15 DIAGNOSIS — R918 Other nonspecific abnormal finding of lung field: Secondary | ICD-10-CM | POA: Diagnosis not present

## 2023-01-15 DIAGNOSIS — C3492 Malignant neoplasm of unspecified part of left bronchus or lung: Secondary | ICD-10-CM | POA: Insufficient documentation

## 2023-01-15 DIAGNOSIS — Z9221 Personal history of antineoplastic chemotherapy: Secondary | ICD-10-CM | POA: Insufficient documentation

## 2023-01-15 DIAGNOSIS — J439 Emphysema, unspecified: Secondary | ICD-10-CM | POA: Diagnosis not present

## 2023-01-15 DIAGNOSIS — R634 Abnormal weight loss: Secondary | ICD-10-CM | POA: Insufficient documentation

## 2023-01-15 DIAGNOSIS — C3490 Malignant neoplasm of unspecified part of unspecified bronchus or lung: Secondary | ICD-10-CM | POA: Diagnosis not present

## 2023-01-15 LAB — CMP (CANCER CENTER ONLY)
ALT: 14 U/L (ref 0–44)
AST: 14 U/L — ABNORMAL LOW (ref 15–41)
Albumin: 4.3 g/dL (ref 3.5–5.0)
Alkaline Phosphatase: 76 U/L (ref 38–126)
Anion gap: 9 (ref 5–15)
BUN: 10 mg/dL (ref 8–23)
CO2: 25 mmol/L (ref 22–32)
Calcium: 9.4 mg/dL (ref 8.9–10.3)
Chloride: 107 mmol/L (ref 98–111)
Creatinine: 0.77 mg/dL (ref 0.61–1.24)
GFR, Estimated: >60 mL/min (ref >60)
Glucose, Bld: 107 mg/dL — ABNORMAL HIGH (ref 70–99)
Potassium: 4 mmol/L (ref 3.5–5.1)
Sodium: 141 mmol/L (ref 135–145)
Total Bilirubin: 0.6 mg/dL (ref 0.3–1.2)
Total Protein: 7 g/dL (ref 6.5–8.1)

## 2023-01-15 LAB — CBC WITH DIFFERENTIAL (CANCER CENTER ONLY)
Abs Immature Granulocytes: 0.02 10*3/uL (ref 0.00–0.07)
Basophils Absolute: 0 10*3/uL (ref 0.0–0.1)
Basophils Relative: 0 %
Eosinophils Absolute: 0.1 10*3/uL (ref 0.0–0.5)
Eosinophils Relative: 2 %
HCT: 34.1 % — ABNORMAL LOW (ref 39.0–52.0)
Hemoglobin: 11.6 g/dL — ABNORMAL LOW (ref 13.0–17.0)
Immature Granulocytes: 0 %
Lymphocytes Relative: 20 %
Lymphs Abs: 0.9 10*3/uL (ref 0.7–4.0)
MCH: 32.9 pg (ref 26.0–34.0)
MCHC: 34 g/dL (ref 30.0–36.0)
MCV: 96.6 fL (ref 80.0–100.0)
Monocytes Absolute: 0.5 10*3/uL (ref 0.1–1.0)
Monocytes Relative: 12 %
Neutro Abs: 3 10*3/uL (ref 1.7–7.7)
Neutrophils Relative %: 66 %
Platelet Count: 285 10*3/uL (ref 150–400)
RBC: 3.53 MIL/uL — ABNORMAL LOW (ref 4.22–5.81)
RDW: 16.6 % — ABNORMAL HIGH (ref 11.5–15.5)
WBC Count: 4.6 10*3/uL (ref 4.0–10.5)
nRBC: 0 % (ref 0.0–0.2)

## 2023-01-15 MED ORDER — IOHEXOL 300 MG/ML  SOLN
75.0000 mL | Freq: Once | INTRAMUSCULAR | Status: AC | PRN
  Administered 2023-01-15: 75 mL via INTRAVENOUS

## 2023-01-15 MED ORDER — SODIUM CHLORIDE (PF) 0.9 % IJ SOLN
INTRAMUSCULAR | Status: AC
  Filled 2023-01-15: qty 50

## 2023-01-19 ENCOUNTER — Inpatient Hospital Stay (HOSPITAL_BASED_OUTPATIENT_CLINIC_OR_DEPARTMENT_OTHER): Payer: Medicare Other | Admitting: Internal Medicine

## 2023-01-19 VITALS — BP 105/64 | HR 78 | Temp 97.6°F | Resp 18 | Ht 71.0 in | Wt 168.0 lb

## 2023-01-19 DIAGNOSIS — C3432 Malignant neoplasm of lower lobe, left bronchus or lung: Secondary | ICD-10-CM | POA: Diagnosis not present

## 2023-01-19 DIAGNOSIS — R634 Abnormal weight loss: Secondary | ICD-10-CM | POA: Diagnosis not present

## 2023-01-19 DIAGNOSIS — C3490 Malignant neoplasm of unspecified part of unspecified bronchus or lung: Secondary | ICD-10-CM | POA: Diagnosis not present

## 2023-01-19 DIAGNOSIS — Z923 Personal history of irradiation: Secondary | ICD-10-CM | POA: Diagnosis not present

## 2023-01-19 DIAGNOSIS — Z9221 Personal history of antineoplastic chemotherapy: Secondary | ICD-10-CM | POA: Diagnosis not present

## 2023-01-19 NOTE — Progress Notes (Signed)
[place] [phone]   Fax:([phone]  OFFICE PROGRESS NOTE  [name], MD [place] [address]  DIAGNOSIS:  limited stage (T1c, N2, M0) small cell lung cancer with mixture of squamous cell carcinoma in the left upper lobe presented with left upper lobe lung nodule in addition to left hilar and mediastinal lymphadenopathy diagnosed in May 2023.  PRIOR THERAPY:  1) Systemic chemotherapy with cisplatin 75 Mg/M2 on day 1 and etoposide 100 Mg/M2 on days 1, 2 and 3 every 3 weeks for 4 cycles.  This was concurrent with radiotherapy for around 6 weeks during this course of treatment.  Status post 4 cycles.  Last cycle was given on 06/09/2022. 2) prophylactic cranial irradiation under the care of Dr. Tammi Klippel.  CURRENT THERAPY: Observation.  INTERVAL HISTORY: Francisco Austin 72 y.o. male returns to the clinic today for follow-up visit.  The patient was accompanied by his wife.  He is feeling fine today with no concerning complaints except for the persistent weight loss.  He denied having any fatigue or weakness.  He has no nausea, vomiting, diarrhea or constipation.  He has no headache or visual changes.  He denied having any chest pain, shortness of breath, cough or hemoptysis.  He is here today for evaluation with repeat CT scan of the chest for restaging of his disease.   MEDICAL HISTORY: Past Medical History:  Diagnosis Date   Anxiety    Back pain    CAD (coronary artery disease)    COPD (chronic obstructive pulmonary disease) (HCC)    History of kidney stones    HLD (hyperlipidemia)    HTN (hypertension)    Pre-diabetes     ALLERGIES:  is allergic to gabapentin, morphine, other, prednisone, alprazolam, fentanyl, niacin, and penicillamine.  MEDICATIONS:  Current Outpatient Medications  Medication Sig Dispense Refill   albuterol (VENTOLIN HFA) 108 (90 Base) MCG/ACT inhaler Inhale 2 puffs into the lungs every 6  (six) hours as needed for wheezing or shortness of breath.     aspirin EC 81 MG tablet Take 81 mg by mouth daily.     atorvastatin (LIPITOR) 80 MG tablet Take 1 tablet (80 mg total) by mouth daily. Please schedule appointment for additional refills. 90 tablet 1   Cholecalciferol 50 MCG (2000 UT) TABS Take 2,000 Units by mouth daily.     clopidogrel (PLAVIX) 75 MG tablet TAKE ONE TABLET BY MOUTH ONCE DAILY WITH BREAKFAST 90 tablet 3   diclofenac sodium (VOLTAREN) 1 % GEL Apply 1 g topically daily as needed (pain).     diltiazem (TIAZAC) 240 MG 24 hr capsule Take 240 mg by mouth daily.     esomeprazole (NEXIUM) 40 MG capsule Take 40 mg by mouth daily.     guaiFENesin (ROBITUSSIN) 100 MG/5ML liquid Take 10 mLs by mouth every 4 (four) hours as needed for cough or to loosen phlegm. 473 mL 5   HYDROcodone-acetaminophen (NORCO) 10-325 MG tablet Take 1-2 tablets by mouth every 6 (six) hours as needed.     LORazepam (ATIVAN) 1 MG tablet Take 1 mg by mouth 3 (three) times daily.     nitroGLYCERIN (NITROSTAT) 0.4 MG SL tablet Place 0.4 mg under the tongue every 5 (five) minutes as needed for chest pain.     prochlorperazine (COMPAZINE) 10 MG tablet TAKE ONE TABLET BY MOUTH EVERY 6 HOURS AS NEEDED 30 tablet 2   telmisartan (MICARDIS) 80 MG tablet Take  80 mg by mouth daily.     umeclidinium-vilanterol (ANORO ELLIPTA) 62.5-25 MCG/INH AEPB Inhale 1 puff into the lungs daily.     vitamin B-12 (CYANOCOBALAMIN) 500 MCG tablet Take 500 mcg by mouth daily.     No current facility-administered medications for this visit.   Facility-Administered Medications Ordered in Other Visits  Medication Dose Route Frequency Provider Last Rate Last Admin   prochlorperazine (COMPAZINE) 10 MG/2ML injection             SURGICAL HISTORY:  Past Surgical History:  Procedure Laterality Date   APPENDECTOMY     CORONARY STENT INTERVENTION N/A 12/24/2021   Procedure: CORONARY STENT INTERVENTION;  Surgeon: Jettie Booze, MD;   Location: [facility];  Service: Cardiovascular;  Laterality: N/A;   HEMORROIDECTOMY     INTRAVASCULAR ULTRASOUND/IVUS N/A 12/24/2021   Procedure: Intravascular Ultrasound/IVUS;  Surgeon: Jettie Booze, MD;  Location: [facility];  Service: Cardiovascular;  Laterality: N/A;   LEFT HEART CATH AND CORONARY ANGIOGRAPHY N/A 12/23/2021   Procedure: LEFT HEART CATH AND CORONARY ANGIOGRAPHY;  Surgeon: Lorretta Harp, MD;  Location: [facility];  Service: Cardiovascular;  Laterality: N/A;   TONSILLECTOMY     VIDEO BRONCHOSCOPY WITH ENDOBRONCHIAL ULTRASOUND N/A 03/12/2022   Procedure: VIDEO BRONCHOSCOPY WITH ENDOBRONCHIAL ULTRASOUND;  Surgeon: Melrose Nakayama, MD;  Location: [facility];  Service: Thoracic;  Laterality: N/A;    REVIEW OF SYSTEMS:  A comprehensive review of systems was negative except for: Constitutional: positive for anorexia, fatigue, and weight loss Ears, nose, mouth, throat, and face: positive for earaches   PHYSICAL EXAMINATION: General appearance: alert, cooperative, fatigued, and no distress Head: Normocephalic, without obvious abnormality, atraumatic Neck: no adenopathy, no JVD, supple, symmetrical, trachea midline, and thyroid not enlarged, symmetric, no tenderness/mass/nodules Lymph nodes: Cervical, supraclavicular, and axillary nodes normal. Resp: clear to auscultation bilaterally Back: symmetric, no curvature. ROM normal. No CVA tenderness. Cardio: regular rate and rhythm, S1, S2 normal, no murmur, click, rub or gallop GI: soft, non-tender; bowel sounds normal; no masses,  no organomegaly Extremities: extremities normal, atraumatic, no cyanosis or edema  ECOG PERFORMANCE STATUS: 1 - Symptomatic but completely ambulatory  Blood pressure 105/64, pulse 78, temperature 97.6 F (36.4 C), temperature source Oral, resp. rate 18, height 5\' 11"  (1.803 m), weight 168 lb (76.2 kg), SpO2 95 %.  LABORATORY DATA: Lab Results  Component Value Date   WBC  4.6 01/15/2023   HGB 11.6 (L) 01/15/2023   HCT 34.1 (L) 01/15/2023   MCV 96.6 01/15/2023   PLT 285 01/15/2023      Chemistry      Component Value Date/Time   NA 141 01/15/2023 0957   NA 140 12/13/2021 0900   K 4.0 01/15/2023 0957   CL 107 01/15/2023 0957   CO2 25 01/15/2023 0957   BUN 10 01/15/2023 0957   BUN 9 12/13/2021 0900   CREATININE 0.77 01/15/2023 0957      Component Value Date/Time   CALCIUM 9.4 01/15/2023 0957   ALKPHOS 76 01/15/2023 0957   AST 14 (L) 01/15/2023 0957   ALT 14 01/15/2023 0957   BILITOT 0.6 01/15/2023 0957       RADIOGRAPHIC STUDIES: CT Chest W Contrast  Result Date: 01/19/2023 CLINICAL DATA:  Restaging small cell lung cancer. * Tracking Code: BO * EXAM: CT CHEST WITH CONTRAST TECHNIQUE: Multidetector CT imaging of the chest was performed during intravenous contrast administration. RADIATION DOSE REDUCTION: This exam was performed according to the departmental  dose-optimization program which includes automated exposure control, adjustment of the mA and/or kV according to patient size and/or use of iterative reconstruction technique. CONTRAST:  26mL OMNIPAQUE IOHEXOL 300 MG/ML  SOLN COMPARISON:  10/17/2022 chest CT and prior PET-CT 06/28/2022 FINDINGS: Cardiovascular: The heart is normal in size. No pericardial effusion. Stable tortuosity and calcification of the thoracic aorta but no aneurysm or dissection. Stable branch vessel calcifications including extensive three-vessel coronary artery calcifications. Stable lipomatous hypertrophy of the interatrial septum. Mediastinum/Nodes: Stable borderline mediastinal and hilar lymph nodes. No new or progressive findings are demonstrated. Stable 11 mm precarinal node, 9 mm AP window node and 8 mm subcarinal node. No hilar adenopathy the. The esophagus is grossly normal. Lungs/Pleura: The 2.2 cm superior segment left lower lobe pulmonary mass seen on the initial chest CT from 01/29/2022 is not identified at all on the  study suggesting a good response to treatment. No evidence of residual or recurrent tumor. The cystic area in the left upper lobe described on prior CT scans has the appearance of emphysematous disease. I do not see any ground-glass nodularity or worrisome soft tissue nodules. Continued observation is reasonable. No new pulmonary lesions or nodules. No pleural effusions or pleural nodules. Stable basilar scarring changes. Upper Abdomen: Stable low-attenuation left adrenal gland nodule measuring 10 Hounsfield units on the prior PET-CT and not showing any hypermetabolism. Findings consistent with a benign adrenal gland adenoma. Stable right renal calculi. No upper abdominal adenopathy. Stable advanced vascular disease. Musculoskeletal: IMPRESSION: 1. The 2.2 cm superior segment left lower lobe pulmonary mass seen on the initial chest CT from 01/29/2022 is not identified at all on this study suggesting a good response to treatment. No evidence of residual or recurrent tumor. 2. Stable borderline mediastinal and hilar lymph nodes (when compared to the most recent chest CT). These have regressed in size since the initial exams. No new or progressive findings. 3. Stable cystic area in the left upper lobe likely an area of emphysematous change and scarring. Recommend continued observation. Follow-up chest CT in 6-12 months is reasonable. 4. Stable left adrenal gland adenoma. 5. Stable right renal calculi. Aortic Atherosclerosis (ICD10-I70.0) and Emphysema (ICD10-J43.9). Electronically Signed   By: Marijo Sanes M.D.   On: 01/19/2023 09:37      ASSESSMENT AND PLAN: This is a very pleasant 72 years old white male diagnosed with limited stage (T1c, N2, M0) small cell lung cancer with mixture of squamous cell carcinoma in the left upper lobe presented with left upper lobe lung nodule in addition to left hilar and mediastinal lymphadenopathy diagnosed in May 2023.  The patient underwent systemic chemotherapy with cisplatin 75  Mg/M2 on day 1 and etoposide 100 Mg/M2 on days 1, 2 and 3 status post 4 cycles.  This is concurrent with radiotherapy.  Last dose of chemotherapy was on 06/09/2022.  This was followed by prophylactic cranial irradiation. The patient is currently on observation and he is feeling fine with no concerning complaints except for the fatigue and weight loss. He had repeat CT scan of the chest performed recently.  I personally and independently reviewed the scan and discussed the result with the patient and his wife. His scan showed no concerning findings for disease recurrence or metastasis. I recommended for him to continue on observation with repeat CT scan of the chest, abdomen and pelvis in 6 months. For the weight loss, he was advised to increase his oral intake with supplements and also snacking between meals. He was advised to call  immediately if he has any other concerning symptoms in the interval. The patient voices understanding of current disease status and treatment options and is in agreement with the current care plan.  All questions were answered. The patient knows to call the clinic with any problems, questions or concerns. We can certainly see the patient much sooner if necessary.  The total time spent in the appointment was 20 minutes.  Disclaimer: This note was dictated with voice recognition software. Similar sounding words can inadvertently be transcribed and may not be corrected upon review.

## 2023-01-21 ENCOUNTER — Ambulatory Visit: Payer: Medicare Other | Attending: Student | Admitting: Student

## 2023-01-21 ENCOUNTER — Encounter: Payer: Self-pay | Admitting: Student

## 2023-01-21 VITALS — BP 91/55 | HR 63 | Ht 71.0 in | Wt 167.0 lb

## 2023-01-21 DIAGNOSIS — I2510 Atherosclerotic heart disease of native coronary artery without angina pectoris: Secondary | ICD-10-CM

## 2023-01-21 DIAGNOSIS — Z720 Tobacco use: Secondary | ICD-10-CM | POA: Diagnosis not present

## 2023-01-21 DIAGNOSIS — I10 Essential (primary) hypertension: Secondary | ICD-10-CM

## 2023-01-21 DIAGNOSIS — E785 Hyperlipidemia, unspecified: Secondary | ICD-10-CM | POA: Diagnosis not present

## 2023-01-21 NOTE — Patient Instructions (Signed)
Medication Instructions:   Your physician recommends that you continue on your current medications as directed. Please refer to the Current Medication list given to you today.  *If you need a refill on your cardiac medications before your next appointment, please call your pharmacy*  Lab Work: NONE ordered at this time of appointment   If you have labs (blood work) drawn today and your tests are completely normal, you will receive your results only by: MyChart Message (if you have MyChart) OR A paper copy in the mail If you have any lab test that is abnormal or we need to change your treatment, we will call you to review the results.  Testing/Procedures: NONE ordered at this time of appointment   Follow-Up: At [facility], you and your health needs are our priority.  As part of our continuing mission to provide you with exceptional heart care, we have created designated Provider Care Teams.  These Care Teams include your primary Cardiologist (physician) and Advanced Practice Providers (APPs -  Physician Assistants and Nurse Practitioners) who all work together to provide you with the care you need, when you need it.  Your next appointment:   6 month(s)  Provider:   Jonathan Berry, MD     Other Instructions   

## 2023-01-29 DIAGNOSIS — H6993 Unspecified Eustachian tube disorder, bilateral: Secondary | ICD-10-CM | POA: Diagnosis not present

## 2023-01-29 DIAGNOSIS — Z9622 Myringotomy tube(s) status: Secondary | ICD-10-CM | POA: Diagnosis not present

## 2023-02-18 DIAGNOSIS — E7800 Pure hypercholesterolemia, unspecified: Secondary | ICD-10-CM | POA: Diagnosis not present

## 2023-02-18 DIAGNOSIS — I10 Essential (primary) hypertension: Secondary | ICD-10-CM | POA: Diagnosis not present

## 2023-02-18 DIAGNOSIS — I700 Atherosclerosis of aorta: Secondary | ICD-10-CM | POA: Diagnosis not present

## 2023-02-18 DIAGNOSIS — E119 Type 2 diabetes mellitus without complications: Secondary | ICD-10-CM | POA: Diagnosis not present

## 2023-02-18 DIAGNOSIS — E1169 Type 2 diabetes mellitus with other specified complication: Secondary | ICD-10-CM | POA: Diagnosis not present

## 2023-02-26 ENCOUNTER — Other Ambulatory Visit: Payer: Self-pay | Admitting: Cardiovascular Disease

## 2023-02-26 DIAGNOSIS — E782 Mixed hyperlipidemia: Secondary | ICD-10-CM

## 2023-03-02 ENCOUNTER — Encounter: Payer: Self-pay | Admitting: Cardiovascular Disease

## 2023-03-06 NOTE — Progress Notes (Signed)
  Radiation Oncology         [phone] ________________________________  Name: Francisco Austin MRN: [id number]  Date: 09/03/2022  DOB: 1950-11-28  End of Treatment Note  Diagnosis:   72 yo man with NSCLC, syncrhonous squamous cell carcinoma involving the LLL lung and limited stage small cell carcinoma in an 11L node      Indication for treatment:  Prophylactic       Radiation treatment dates:   08/21/22 - 09/03/22  Site/dose:   The whole brain was treated to 25 Gy in 10 fractions of 2.5 Gy  for prophylactic cranial irradiation.   Beams/energy:   Right and Left radiation fields were treated using 6 MV X-rays with custom MLC collimation to shield the eyes and face.  The patient was immobilized with a thermoplastic mask and isocenter was verified with weekly port films.  Narrative: The patient tolerated radiation treatment relatively well with only modest fatigue  Plan: The patient has completed radiation treatment. The patient will return to radiation oncology clinic for routine followup in one month. I advised them to call or return sooner if they have any questions or concerns related to their recovery or treatment. ________________________________  Artist Pais. Kathrynn Running, M.D.

## 2023-04-29 ENCOUNTER — Telehealth: Payer: Self-pay | Admitting: Medical Oncology

## 2023-04-29 NOTE — Telephone Encounter (Signed)
Rib pain on left side after a backward fall 2 weeks ago onto tile floor.  "Could this be related to cancer"? I told pt probably not and to see PCP . He has appt with Dr. Tiburcio Pea on Thursday.

## 2023-04-30 DIAGNOSIS — R0789 Other chest pain: Secondary | ICD-10-CM | POA: Diagnosis not present

## 2023-04-30 DIAGNOSIS — N200 Calculus of kidney: Secondary | ICD-10-CM | POA: Diagnosis not present

## 2023-07-03 DIAGNOSIS — N200 Calculus of kidney: Secondary | ICD-10-CM | POA: Diagnosis not present

## 2023-07-10 ENCOUNTER — Ambulatory Visit: Payer: Self-pay | Attending: Cardiovascular Disease | Admitting: Cardiovascular Disease

## 2023-07-10 ENCOUNTER — Encounter: Payer: Self-pay | Admitting: Cardiovascular Disease

## 2023-07-10 VITALS — BP 108/60 | HR 64 | Ht 71.0 in | Wt 164.2 lb

## 2023-07-10 DIAGNOSIS — I2510 Atherosclerotic heart disease of native coronary artery without angina pectoris: Secondary | ICD-10-CM | POA: Diagnosis not present

## 2023-07-10 DIAGNOSIS — I10 Essential (primary) hypertension: Secondary | ICD-10-CM

## 2023-07-10 DIAGNOSIS — E782 Mixed hyperlipidemia: Secondary | ICD-10-CM

## 2023-07-10 NOTE — Assessment & Plan Note (Signed)
History of coronary artery disease with a coronary calcium score performed 12/16/2021 which was 1970 with moderate to severe multivessel disease.  Based on this I performed outpatient radial diagnostic cath on him 12/23/2021 revealing high-grade calcified proximal LAD disease as well as proximal to mid RCA disease with normal LV function.  The following day Dr. Eldridge Dace stented his LAD and performed orbital atherectomy and stenting of his RCA.  He remains on aspirin and clopidogrel.  He is asymptomatic.

## 2023-07-10 NOTE — Assessment & Plan Note (Signed)
History of hyperlipidemia on high-dose statin therapy with lipid profile performed 07/14/2022 revealing a total cholesterol 131, LDL 66 and HDL of 42.

## 2023-07-10 NOTE — Progress Notes (Signed)
07/10/2023 Francisco Austin   1951/04/20  [id number]  Primary Physician Noberto Retort, MD Primary Cardiologist: Runell Gess MD Nicholes Calamity, [place]  HPI:  Francisco Austin is a 72 y.o.  father of 2 children, with no grandchildren, former patient of Dr. York Spaniel, who was referred to me by Dr. Tiburcio Pea to be established in my practice because of known coronary artery disease.  He is a retired Financial trader.  He smokes a pipe.  I last saw him in the office 07/22/2022. He does have treated hypertension hyperlipidemia.  His mother Francisco Austin who died in 08/08/2017 was my patient as well.  He is never had a heart attack or stroke.  He is had several heart catheterizations Dr. Donnie Aho remotely with one PCI but no stent.  He does get frequent atypical chest pain.  His last 2D echo performed 04/06/2017 was essentially normal.   Because of ongoing chest pain I obtain a coronary CTA on 12/16/2021 that revealed a coronary calcium score of 1970 with moderate to severe multivessel disease.  Based on this I performed outpatient radial diagnostic cath on him 12/23/2021 revealing a high-grade calcified proximal LAD disease as well as proximal to mid RCA disease.  LV function was normal.  He did develop hypotension with a conduction abnormality when my TIG catheter across his aortic valve and therefore I aborted his intervention and staged the following day.  Dr. Eldridge Dace stented his proximal LAD and performed orbital atherectomy and stenting of his RCA.  He was discharged home the following day on aspirin and clopidogrel.   Unfortunately, he was diagnosed with small cell and squamous cell lung cancer and is undergoing chemotherapy and radiation therapy.  He is scheduled to undergo brain radiation therapy as well.  He is treated by Dr. Arlis Porta.    Since I saw him a year ago he continues to do well from a heart point of view.  He is still is being treated for lung cancer and is lost quite a bit of  weight.  He denies chest pain or shortness of breath.  Current Meds  Medication Sig   albuterol (VENTOLIN HFA) 108 (90 Base) MCG/ACT inhaler Inhale 2 puffs into the lungs every 6 (six) hours as needed for wheezing or shortness of breath.   aspirin EC 81 MG tablet Take 81 mg by mouth daily.   atorvastatin (LIPITOR) 80 MG tablet Take 1 tablet (80 mg total) by mouth daily.   Cholecalciferol 50 MCG (2000 UT) TABS Take 2,000 Units by mouth daily.   clopidogrel (PLAVIX) 75 MG tablet TAKE ONE TABLET BY MOUTH ONCE DAILY WITH BREAKFAST   diclofenac sodium (VOLTAREN) 1 % GEL Apply 1 g topically daily as needed (pain).   diltiazem (TIAZAC) 240 MG 24 hr capsule Take 240 mg by mouth daily.   esomeprazole (NEXIUM) 40 MG capsule Take 40 mg by mouth daily.   guaiFENesin (ROBITUSSIN) 100 MG/5ML liquid Take 10 mLs by mouth every 4 (four) hours as needed for cough or to loosen phlegm.   HYDROcodone-acetaminophen (NORCO) 10-325 MG tablet Take 1-2 tablets by mouth every 6 (six) hours as needed.   LORazepam (ATIVAN) 1 MG tablet Take 1 mg by mouth 3 (three) times daily.   nitroGLYCERIN (NITROSTAT) 0.4 MG SL tablet Place 0.4 mg under the tongue every 5 (five) minutes as needed for chest pain.   telmisartan (MICARDIS) 80 MG tablet Take 80 mg by mouth daily.   umeclidinium-vilanterol (ANORO ELLIPTA)  62.5-25 MCG/INH AEPB Inhale 1 puff into the lungs daily.   vitamin B-12 (CYANOCOBALAMIN) 500 MCG tablet Take 500 mcg by mouth daily.     Allergies  Allergen Reactions   Gabapentin Anaphylaxis and Other (See Comments)    B/P dropped to "40"    Morphine Other (See Comments)    Burn his veins   Other Anaphylaxis    Androgenic Anabolic Steroid   Prednisone Anaphylaxis   Alprazolam Other (See Comments)    Other reaction(s): weirds him out   Fentanyl     Does not relieve pain for him    Niacin Other (See Comments)    Body feel weird   Penicillamine Hives    Social History   Socioeconomic History   Marital  status: Married    Spouse name: Not on file   Number of children: Not on file   Years of education: Not on file   Highest education level: Not on file  Occupational History   Not on file  Tobacco Use   Smoking status: Every Day    Types: Pipe   Smokeless tobacco: Never  Vaping Use   Vaping status: Never Used  Substance and Sexual Activity   Alcohol use: Never   Drug use: Never   Sexual activity: Not Currently  Other Topics Concern   Not on file  Social History Narrative   Not on file   Social Determinants of Health   Financial Resource Strain: Not on file  Food Insecurity: Not on file  Transportation Needs: Not on file  Physical Activity: Not on file  Stress: Not on file  Social Connections: Not on file  Intimate Partner Violence: Not on file     Review of Systems: General: negative for chills, fever, night sweats or weight changes.  Cardiovascular: negative for chest pain, dyspnea on exertion, edema, orthopnea, palpitations, paroxysmal nocturnal dyspnea or shortness of breath Dermatological: negative for rash Respiratory: negative for cough or wheezing Urologic: negative for hematuria Abdominal: negative for nausea, vomiting, diarrhea, bright red blood per rectum, melena, or hematemesis Neurologic: negative for visual changes, syncope, or dizziness All other systems reviewed and are otherwise negative except as noted above.    Blood pressure 108/60, pulse 64, height 5\' 11"  (1.803 m), weight 164 lb 3.2 oz (74.5 kg), SpO2 94%.  General appearance: alert and no distress Neck: no adenopathy, no carotid bruit, no JVD, supple, symmetrical, trachea midline, and thyroid not enlarged, symmetric, no tenderness/mass/nodules Lungs: clear to auscultation bilaterally Heart: Regular rate and rhythm without murmurs gallops rubs or clicks Extremities: extremities normal, atraumatic, no cyanosis or edema Pulses: 2+ and symmetric Skin: Skin color, texture, turgor normal. No rashes  or lesions Neurologic: Grossly normal  EKG not performed today      ASSESSMENT AND PLAN:   Essential hypertension History of essential hypertension her blood pressure measured today at 108/60.  He is on Micardis and diltiazem.  Hyperlipidemia History of hyperlipidemia on high-dose statin therapy with lipid profile performed 07/14/2022 revealing a total cholesterol 131, LDL 66 and HDL of 42.  Coronary artery disease History of coronary artery disease with a coronary calcium score performed 12/16/2021 which was 1970 with moderate to severe multivessel disease.  Based on this I performed outpatient radial diagnostic cath on him 12/23/2021 revealing high-grade calcified proximal LAD disease as well as proximal to mid RCA disease with normal LV function.  The following day Dr. Eldridge Dace stented his LAD and performed orbital atherectomy and stenting of his RCA.  He  remains on aspirin and clopidogrel.  He is asymptomatic.     Runell Gess MD FACP,FACC,FAHA, [facility] 07/10/2023 11:46 AM

## 2023-07-10 NOTE — Assessment & Plan Note (Signed)
History of essential hypertension her blood pressure measured today at 108/60.  He is on Micardis and diltiazem.

## 2023-07-10 NOTE — Patient Instructions (Signed)
Medication Instructions:  Your physician recommends that you continue on your current medications as directed. Please refer to the Current Medication list given to you today.\  *If you need a refill on your cardiac medications before your next appointment, please call your pharmacy*   Follow-Up: At [facility], you and your health needs are our priority.  As part of our continuing mission to provide you with exceptional heart care, we have created designated Provider Care Teams.  These Care Teams include your primary Cardiologist (physician) and Advanced Practice Providers (APPs -  Physician Assistants and Nurse Practitioners) who all work together to provide you with the care you need, when you need it.   Your next appointment:   6 month(s)  Provider:   Marjie Skiff, PA-C    Then, Nanetta Batty, MD will plan to see you again in 1 year(s).

## 2023-07-20 ENCOUNTER — Other Ambulatory Visit: Payer: Self-pay | Admitting: Internal Medicine

## 2023-07-20 ENCOUNTER — Inpatient Hospital Stay: Payer: Medicare Other | Attending: Physician Assistant

## 2023-07-20 ENCOUNTER — Ambulatory Visit (HOSPITAL_COMMUNITY)
Admission: RE | Admit: 2023-07-20 | Discharge: 2023-07-20 | Disposition: A | Discharge status: Home / Self Care | Payer: Medicare Other | Source: Ambulatory Visit | Attending: Internal Medicine | Admitting: Internal Medicine

## 2023-07-20 DIAGNOSIS — Z923 Personal history of irradiation: Secondary | ICD-10-CM | POA: Insufficient documentation

## 2023-07-20 DIAGNOSIS — N200 Calculus of kidney: Secondary | ICD-10-CM | POA: Diagnosis not present

## 2023-07-20 DIAGNOSIS — R5383 Other fatigue: Secondary | ICD-10-CM | POA: Insufficient documentation

## 2023-07-20 DIAGNOSIS — C3412 Malignant neoplasm of upper lobe, left bronchus or lung: Secondary | ICD-10-CM | POA: Insufficient documentation

## 2023-07-20 DIAGNOSIS — N281 Cyst of kidney, acquired: Secondary | ICD-10-CM | POA: Diagnosis not present

## 2023-07-20 DIAGNOSIS — C3490 Malignant neoplasm of unspecified part of unspecified bronchus or lung: Secondary | ICD-10-CM | POA: Insufficient documentation

## 2023-07-20 DIAGNOSIS — J432 Centrilobular emphysema: Secondary | ICD-10-CM | POA: Diagnosis not present

## 2023-07-20 LAB — CMP (CANCER CENTER ONLY)
ALT: 17 U/L (ref 0–44)
AST: 13 U/L — ABNORMAL LOW (ref 15–41)
Albumin: 4.3 g/dL (ref 3.5–5.0)
Alkaline Phosphatase: 79 U/L (ref 38–126)
Anion gap: 7 (ref 5–15)
BUN: 15 mg/dL (ref 8–23)
CO2: 26 mmol/L (ref 22–32)
Calcium: 9.3 mg/dL (ref 8.9–10.3)
Chloride: 104 mmol/L (ref 98–111)
Creatinine: 0.88 mg/dL (ref 0.61–1.24)
GFR, Estimated: >60 mL/min (ref >60)
Glucose, Bld: 122 mg/dL — ABNORMAL HIGH (ref 70–99)
Potassium: 4.1 mmol/L (ref 3.5–5.1)
Sodium: 137 mmol/L (ref 135–145)
Total Bilirubin: 0.4 mg/dL (ref 0.3–1.2)
Total Protein: 7.2 g/dL (ref 6.5–8.1)

## 2023-07-20 LAB — CBC WITH DIFFERENTIAL (CANCER CENTER ONLY)
Abs Immature Granulocytes: 0.02 10*3/uL (ref 0.00–0.07)
Basophils Absolute: 0 10*3/uL (ref 0.0–0.1)
Basophils Relative: 1 %
Eosinophils Absolute: 0.1 10*3/uL (ref 0.0–0.5)
Eosinophils Relative: 2 %
HCT: 37.5 % — ABNORMAL LOW (ref 39.0–52.0)
Hemoglobin: 12.7 g/dL — ABNORMAL LOW (ref 13.0–17.0)
Immature Granulocytes: 0 %
Lymphocytes Relative: 21 %
Lymphs Abs: 1.1 10*3/uL (ref 0.7–4.0)
MCH: 32.8 pg (ref 26.0–34.0)
MCHC: 33.9 g/dL (ref 30.0–36.0)
MCV: 96.9 fL (ref 80.0–100.0)
Monocytes Absolute: 0.6 10*3/uL (ref 0.1–1.0)
Monocytes Relative: 12 %
Neutro Abs: 3.3 10*3/uL (ref 1.7–7.7)
Neutrophils Relative %: 64 %
Platelet Count: 257 10*3/uL (ref 150–400)
RBC: 3.87 MIL/uL — ABNORMAL LOW (ref 4.22–5.81)
RDW: 15.1 % (ref 11.5–15.5)
WBC Count: 5.2 10*3/uL (ref 4.0–10.5)
nRBC: 0 % (ref 0.0–0.2)

## 2023-07-20 MED ORDER — IOHEXOL 300 MG/ML  SOLN
100.0000 mL | Freq: Once | INTRAMUSCULAR | Status: AC | PRN
  Administered 2023-07-20: 100 mL via INTRAVENOUS

## 2023-07-22 ENCOUNTER — Ambulatory Visit: Payer: Medicare Other | Admitting: Internal Medicine

## 2023-07-22 NOTE — Progress Notes (Signed)
[facility] OFFICE PROGRESS NOTE  Francisco Retort, MD [address] [place] [address]  DIAGNOSIS: limited stage (T1c, N2, M0) small cell lung cancer with mixture of squamous cell carcinoma in the left upper lobe presented with left upper lobe lung nodule in addition to left hilar and mediastinal lymphadenopathy diagnosed in May 2023.   PRIOR THERAPY: 1) Systemic chemotherapy with cisplatin 75 Mg/M2 on day 1 and etoposide 100 Mg/M2 on days 1, 2 and 3 every 3 weeks for 4 cycles.  This was concurrent with radiotherapy for around 6 weeks during this course of treatment.  Status post 4 cycles.  Last cycle was given on 06/09/2022.  2) prophylactic cranial irradiation under the care of Dr. Kathrynn Running.  CURRENT THERAPY: Observation.   INTERVAL HISTORY: Francisco Austin 72 y.o. male returns to the clinic today for a follow-up visit.  The patient was last seen by Dr. Arbutus Ped 6 months ago.  He is currently on observation for his history of small cell lung cancer.  He denies any major changes in his health since he was last seen. He reports fatigue but tries to be active and plays golf twice a week. Denies any fever, chills, night sweats, or unexplained weight loss.  He had lost a lot of weight when he was undergoing chemotherapy and never quite gained that back.  He does state that he has a good appetite and eats well.  He denies any nausea, vomiting, diarrhea, or constipation.  If he ever has constipation, it is managed with dietary changes such as eating salads and apples.  The patient states that he has had migraines and cluster headaches his entire life.  Denies any changes with the severity or frequency of his headaches.  Denies any chest pain or or hemoptysis.  He denies any significant cough but he occasionally may get "a little bit of a cough" due to sinus drainage.  He takes Mucinex.  He denies any significant dyspnea.  He recently had a restaging CT scan performed.  He is  here today for evaluation to review his scan results.   MEDICAL HISTORY: Past Medical History:  Diagnosis Date   Anxiety    Back pain    CAD (coronary artery disease)    COPD (chronic obstructive pulmonary disease) (HCC)    History of kidney stones    HLD (hyperlipidemia)    HTN (hypertension)    Pre-diabetes     ALLERGIES:  is allergic to gabapentin, morphine, other, prednisone, alprazolam, fentanyl, niacin, and penicillamine.  MEDICATIONS:  Current Outpatient Medications  Medication Sig Dispense Refill   albuterol (VENTOLIN HFA) 108 (90 Base) MCG/ACT inhaler Inhale 2 puffs into the lungs every 6 (six) hours as needed for wheezing or shortness of breath.     aspirin EC 81 MG tablet Take 81 mg by mouth daily.     atorvastatin (LIPITOR) 80 MG tablet Take 1 tablet (80 mg total) by mouth daily. 90 tablet 1   Cholecalciferol 50 MCG (2000 UT) TABS Take 2,000 Units by mouth daily.     clopidogrel (PLAVIX) 75 MG tablet TAKE ONE TABLET BY MOUTH ONCE DAILY WITH BREAKFAST 90 tablet 3   diclofenac sodium (VOLTAREN) 1 % GEL Apply 1 g topically daily as needed (pain).     diltiazem (TIAZAC) 240 MG 24 hr capsule Take 240 mg by mouth daily.     esomeprazole (NEXIUM) 40 MG capsule Take 40 mg by mouth daily.     guaiFENesin (  ROBITUSSIN) 100 MG/5ML liquid Take 10 mLs by mouth every 4 (four) hours as needed for cough or to loosen phlegm. 473 mL 5   HYDROcodone-acetaminophen (NORCO) 10-325 MG tablet Take 1-2 tablets by mouth every 6 (six) hours as needed.     LORazepam (ATIVAN) 1 MG tablet Take 1 mg by mouth 3 (three) times daily.     nitroGLYCERIN (NITROSTAT) 0.4 MG SL tablet Place 0.4 mg under the tongue every 5 (five) minutes as needed for chest pain.     prochlorperazine (COMPAZINE) 10 MG tablet TAKE ONE TABLET BY MOUTH EVERY 6 HOURS AS NEEDED 30 tablet 2   telmisartan (MICARDIS) 80 MG tablet Take 80 mg by mouth daily.     umeclidinium-vilanterol (ANORO ELLIPTA) 62.5-25 MCG/INH AEPB Inhale 1 puff  into the lungs daily.     vitamin B-12 (CYANOCOBALAMIN) 500 MCG tablet Take 500 mcg by mouth daily.     No current facility-administered medications for this visit.   Facility-Administered Medications Ordered in Other Visits  Medication Dose Route Frequency Provider Last Rate Last Admin   prochlorperazine (COMPAZINE) 10 MG/2ML injection             SURGICAL HISTORY:  Past Surgical History:  Procedure Laterality Date   APPENDECTOMY     CORONARY STENT INTERVENTION N/A 12/24/2021   Procedure: CORONARY STENT INTERVENTION;  Surgeon: Corky Crafts, MD;  Location: [facility] INVASIVE CV LAB;  Service: Cardiovascular;  Laterality: N/A;   CORONARY ULTRASOUND/IVUS N/A 12/24/2021   Procedure: Intravascular Ultrasound/IVUS;  Surgeon: Corky Crafts, MD;  Location: [facility] INVASIVE CV LAB;  Service: Cardiovascular;  Laterality: N/A;   HEMORROIDECTOMY     LEFT HEART CATH AND CORONARY ANGIOGRAPHY N/A 12/23/2021   Procedure: LEFT HEART CATH AND CORONARY ANGIOGRAPHY;  Surgeon: Runell Gess, MD;  Location: [facility] INVASIVE CV LAB;  Service: Cardiovascular;  Laterality: N/A;   TONSILLECTOMY     VIDEO BRONCHOSCOPY WITH ENDOBRONCHIAL ULTRASOUND N/A 03/12/2022   Procedure: VIDEO BRONCHOSCOPY WITH ENDOBRONCHIAL ULTRASOUND;  Surgeon: Loreli Slot, MD;  Location: [facility] OR;  Service: Thoracic;  Laterality: N/A;    REVIEW OF SYSTEMS:   Review of Systems  Constitutional: Positive for fatigue. Negative for appetite change, chills, fever and unexpected weight change.  HENT: Negative for mouth sores, nosebleeds, sore throat and trouble swallowing.   Eyes: Negative for eye problems and icterus.  Respiratory: Negative for significant cough, hemoptysis, shortness of breath and wheezing.   Cardiovascular: Negative for chest pain and leg swelling.  Gastrointestinal: Negative for abdominal pain, constipation, diarrhea, nausea and vomiting.  Genitourinary: Negative for bladder incontinence, difficulty urinating,  dysuria, frequency and hematuria.   Musculoskeletal: Negative for back pain, gait problem, neck pain and neck stiffness.  Skin: Negative for itching and rash.  Neurological: Positive for occasional baseline chronic headaches. Negative for dizziness, extremity weakness, gait problem, light-headedness and seizures.  Hematological: Negative for adenopathy. Does not bruise/bleed easily.  Psychiatric/Behavioral: Negative for confusion, depression and sleep disturbance. The patient is not nervous/anxious.     PHYSICAL EXAMINATION:  Blood pressure 102/68, pulse 72, temperature 97.8 F (36.6 C), temperature source Oral, resp. rate 14, weight 165 lb 14.4 oz (75.3 kg), SpO2 93%.  ECOG PERFORMANCE STATUS: 1  Physical Exam  Constitutional: Oriented to person, place, and time and well-developed, well-nourished, and in no distress.  HENT:  Head: Normocephalic and atraumatic.  Mouth/Throat: Oropharynx is clear and moist. No oropharyngeal exudate.  Eyes: Conjunctivae are normal. Right eye exhibits no discharge. Left eye exhibits no discharge. No  scleral icterus.  Neck: Normal range of motion. Neck supple.  Cardiovascular: Normal rate, regular rhythm, normal heart sounds and intact distal pulses.   Pulmonary/Chest: Effort normal and breath sounds normal. No respiratory distress. No wheezes. No rales.  Abdominal: Soft. Bowel sounds are normal. Exhibits no distension and no mass. There is no tenderness.  Musculoskeletal: Normal range of motion. Exhibits no edema.  Lymphadenopathy:    No cervical adenopathy.  Neurological: Alert and oriented to person, place, and time. Exhibits normal muscle tone. Gait normal. Coordination normal.  Skin: Skin is warm and dry. No rash noted. Not diaphoretic. No erythema. No pallor.  Psychiatric: Mood, memory and judgment normal.  Vitals reviewed.  LABORATORY DATA: Lab Results  Component Value Date   WBC 5.2 07/20/2023   HGB 12.7 (L) 07/20/2023   HCT 37.5 (L)  07/20/2023   MCV 96.9 07/20/2023   PLT 257 07/20/2023      Chemistry      Component Value Date/Time   NA 137 07/20/2023 1143   NA 140 12/13/2021 0900   K 4.1 07/20/2023 1143   CL 104 07/20/2023 1143   CO2 26 07/20/2023 1143   BUN 15 07/20/2023 1143   BUN 9 12/13/2021 0900   CREATININE 0.88 07/20/2023 1143      Component Value Date/Time   CALCIUM 9.3 07/20/2023 1143   ALKPHOS 79 07/20/2023 1143   AST 13 (L) 07/20/2023 1143   ALT 17 07/20/2023 1143   BILITOT 0.4 07/20/2023 1143       RADIOGRAPHIC STUDIES:  No results found.   ASSESSMENT/PLAN:  This is a very pleasant 72 year old Caucasian male diagnosed with limited stage small cell lung cancer (T1c, N2, M0) small cell lung cancer with a mixture squamous cell carcinoma on the left upper lobe.  He presented with a left upper lobe lung nodule in addition to left hilar mediastinal lymphadenopathy.  He was diagnosed in May 2023.  He underwent 4 cycles of systemic chemotherapy with cisplatin 75 mg/m2 on day 1 and etoposide 100 mg/m on days 1, 2, and 3 IV every 3 weeks.  This was with concurrent radiation.  His last dose of treatment was on 06/09/2022.  He then underwent prophylactic cranial irradiation.  He is currently on observation and feeling well.  The patient recently had a restaging CT scan performed.  The patient was seen with Dr. Arbutus Ped today.  Dr. Arbutus Ped personally and independently reviewed the scan and discussed the results with the patient today.  The radiology report has not been read at this time.  Dr. Arbutus Ped did not see any obvious signs of disease progression but we will wait for the final radiology report.  I will call the patient with results.  If this is negative for any disease progression, then we will see the patient back for follow-up visit in 6 months with a repeat CT scan of the chest.  For the patient's fatigue and deconditioning he was advised to increase his activity and stamina.  The patient was  advised to call immediately if he has any concerning symptoms in the interval. The patient voices understanding of current disease status and treatment options and is in agreement with the current care plan. All questions were answered. The patient knows to call the clinic with any problems, questions or concerns. We can certainly see the patient much sooner if necessary     Orders Placed This Encounter  Procedures   CT Chest W Contrast    Standing Status:   Future  Standing Expiration Date:   07/27/2024    Order Specific Question:   If indicated for the ordered procedure, I authorize the administration of contrast media per Radiology protocol    Answer:   Yes    Order Specific Question:   Does the patient have a contrast media/X-ray dye allergy?    Answer:   No    Order Specific Question:   Preferred imaging location?    Answer:   [facility]   CBC with Differential (Cancer Center Only)    Standing Status:   Future    Standing Expiration Date:   07/27/2024   CMP (Cancer Center only)    Standing Status:   Future    Standing Expiration Date:   07/27/2024     Francisco Abraham Arlenne Kimbley, PA-C 07/28/23  ADDENDUM: Hematology/Oncology Attending: I had a face-to-face encounter with the patient today.  I reviewed his record, lab, scan and recommended his care plan.  She is a very pleasant 72 years old white male diagnosed with limited stage small cell lung cancer with mixture of squamous cell carcinoma in the left upper lobe in addition to left hilar and mediastinal lymphadenopathy diagnosed in May 2023 status post systemic chemotherapy with cisplatin and etoposide for 4 cycles concurrent with radiation and followed by prophylactic cranial irradiation. The patient has been on observation since that time and he is feeling fine with no concerning complaints. He had repeat CT scan of the chest performed recently.  I personally and independently reviewed the scan images and discussed  results with the patient today. His scan showed no concerning findings for disease recurrence or metastasis. I recommended for him to continue on observation with repeat CT scan of the chest in 6 months. The patient was advised to call immediately if he has any other concerning symptoms in the interval. The total time spent in the appointment was 25 minutes. Disclaimer: This note was dictated with voice recognition software. Similar sounding words can inadvertently be transcribed and may be missed upon review. Lajuana Matte, MD

## 2023-07-23 ENCOUNTER — Ambulatory Visit: Payer: Medicare Other | Admitting: Internal Medicine

## 2023-07-28 ENCOUNTER — Inpatient Hospital Stay (HOSPITAL_BASED_OUTPATIENT_CLINIC_OR_DEPARTMENT_OTHER): Payer: Medicare Other | Admitting: Physician Assistant

## 2023-07-28 ENCOUNTER — Telehealth: Payer: Self-pay | Admitting: Physician Assistant

## 2023-07-28 VITALS — BP 102/68 | HR 72 | Temp 97.8°F | Resp 14 | Wt 165.9 lb

## 2023-07-28 DIAGNOSIS — C3412 Malignant neoplasm of upper lobe, left bronchus or lung: Secondary | ICD-10-CM | POA: Diagnosis not present

## 2023-07-28 DIAGNOSIS — C3492 Malignant neoplasm of unspecified part of left bronchus or lung: Secondary | ICD-10-CM

## 2023-07-28 DIAGNOSIS — Z923 Personal history of irradiation: Secondary | ICD-10-CM | POA: Diagnosis not present

## 2023-07-28 DIAGNOSIS — R5383 Other fatigue: Secondary | ICD-10-CM | POA: Diagnosis not present

## 2023-07-28 NOTE — Telephone Encounter (Signed)
I called him to review the final report of his CT scan.

## 2023-08-26 DIAGNOSIS — H524 Presbyopia: Secondary | ICD-10-CM | POA: Diagnosis not present

## 2023-08-31 DIAGNOSIS — E119 Type 2 diabetes mellitus without complications: Secondary | ICD-10-CM | POA: Diagnosis not present

## 2023-08-31 DIAGNOSIS — I10 Essential (primary) hypertension: Secondary | ICD-10-CM | POA: Diagnosis not present

## 2023-08-31 DIAGNOSIS — Z125 Encounter for screening for malignant neoplasm of prostate: Secondary | ICD-10-CM | POA: Diagnosis not present

## 2023-08-31 DIAGNOSIS — J439 Emphysema, unspecified: Secondary | ICD-10-CM | POA: Diagnosis not present

## 2023-08-31 DIAGNOSIS — I700 Atherosclerosis of aorta: Secondary | ICD-10-CM | POA: Diagnosis not present

## 2023-08-31 DIAGNOSIS — G894 Chronic pain syndrome: Secondary | ICD-10-CM | POA: Diagnosis not present

## 2023-08-31 DIAGNOSIS — E7800 Pure hypercholesterolemia, unspecified: Secondary | ICD-10-CM | POA: Diagnosis not present

## 2023-08-31 DIAGNOSIS — Z0000 Encounter for general adult medical examination without abnormal findings: Secondary | ICD-10-CM | POA: Diagnosis not present

## 2023-08-31 DIAGNOSIS — C3412 Malignant neoplasm of upper lobe, left bronchus or lung: Secondary | ICD-10-CM | POA: Diagnosis not present

## 2023-10-30 ENCOUNTER — Encounter: Payer: Self-pay | Admitting: Internal Medicine

## 2023-11-02 NOTE — Telephone Encounter (Signed)
Called pt about "coughing up blood" . I told pt if he has any gum disease and was brushing his teeth hard it may cause some bleeding .If he swallows  then the blood may come up after his coughing. Please contact your PCP for further instructions.

## 2023-11-06 ENCOUNTER — Other Ambulatory Visit: Payer: Self-pay | Admitting: Family Medicine

## 2023-11-06 DIAGNOSIS — Z23 Encounter for immunization: Secondary | ICD-10-CM | POA: Diagnosis not present

## 2023-11-06 DIAGNOSIS — I10 Essential (primary) hypertension: Secondary | ICD-10-CM | POA: Diagnosis not present

## 2023-11-06 DIAGNOSIS — E7800 Pure hypercholesterolemia, unspecified: Secondary | ICD-10-CM | POA: Diagnosis not present

## 2023-11-06 DIAGNOSIS — E1169 Type 2 diabetes mellitus with other specified complication: Secondary | ICD-10-CM | POA: Diagnosis not present

## 2023-11-06 DIAGNOSIS — G894 Chronic pain syndrome: Secondary | ICD-10-CM | POA: Diagnosis not present

## 2023-11-06 DIAGNOSIS — M5412 Radiculopathy, cervical region: Secondary | ICD-10-CM

## 2023-11-08 ENCOUNTER — Ambulatory Visit
Admission: RE | Admit: 2023-11-08 | Discharge: 2023-11-08 | Discharge status: Home / Self Care | Payer: Medicare Other | Source: Ambulatory Visit | Attending: Family Medicine | Admitting: Family Medicine

## 2023-11-08 DIAGNOSIS — M4802 Spinal stenosis, cervical region: Secondary | ICD-10-CM | POA: Diagnosis not present

## 2023-11-08 DIAGNOSIS — M5412 Radiculopathy, cervical region: Secondary | ICD-10-CM

## 2023-11-08 DIAGNOSIS — M47812 Spondylosis without myelopathy or radiculopathy, cervical region: Secondary | ICD-10-CM | POA: Diagnosis not present

## 2023-11-26 DIAGNOSIS — M5412 Radiculopathy, cervical region: Secondary | ICD-10-CM | POA: Diagnosis not present

## 2023-11-26 DIAGNOSIS — M5416 Radiculopathy, lumbar region: Secondary | ICD-10-CM | POA: Diagnosis not present

## 2023-11-30 DIAGNOSIS — M6281 Muscle weakness (generalized): Secondary | ICD-10-CM | POA: Diagnosis not present

## 2023-11-30 DIAGNOSIS — M5451 Vertebrogenic low back pain: Secondary | ICD-10-CM | POA: Diagnosis not present

## 2023-11-30 DIAGNOSIS — M542 Cervicalgia: Secondary | ICD-10-CM | POA: Diagnosis not present

## 2023-11-30 DIAGNOSIS — M2569 Stiffness of other specified joint, not elsewhere classified: Secondary | ICD-10-CM | POA: Diagnosis not present

## 2023-12-02 DIAGNOSIS — M5451 Vertebrogenic low back pain: Secondary | ICD-10-CM | POA: Diagnosis not present

## 2023-12-02 DIAGNOSIS — M2569 Stiffness of other specified joint, not elsewhere classified: Secondary | ICD-10-CM | POA: Diagnosis not present

## 2023-12-02 DIAGNOSIS — M542 Cervicalgia: Secondary | ICD-10-CM | POA: Diagnosis not present

## 2023-12-02 DIAGNOSIS — M6281 Muscle weakness (generalized): Secondary | ICD-10-CM | POA: Diagnosis not present

## 2023-12-07 DIAGNOSIS — M6281 Muscle weakness (generalized): Secondary | ICD-10-CM | POA: Diagnosis not present

## 2023-12-07 DIAGNOSIS — M2569 Stiffness of other specified joint, not elsewhere classified: Secondary | ICD-10-CM | POA: Diagnosis not present

## 2023-12-07 DIAGNOSIS — M542 Cervicalgia: Secondary | ICD-10-CM | POA: Diagnosis not present

## 2023-12-07 DIAGNOSIS — M5451 Vertebrogenic low back pain: Secondary | ICD-10-CM | POA: Diagnosis not present

## 2023-12-09 DIAGNOSIS — M542 Cervicalgia: Secondary | ICD-10-CM | POA: Diagnosis not present

## 2023-12-09 DIAGNOSIS — M6281 Muscle weakness (generalized): Secondary | ICD-10-CM | POA: Diagnosis not present

## 2023-12-09 DIAGNOSIS — M2569 Stiffness of other specified joint, not elsewhere classified: Secondary | ICD-10-CM | POA: Diagnosis not present

## 2023-12-09 DIAGNOSIS — M5451 Vertebrogenic low back pain: Secondary | ICD-10-CM | POA: Diagnosis not present

## 2023-12-14 DIAGNOSIS — M5451 Vertebrogenic low back pain: Secondary | ICD-10-CM | POA: Diagnosis not present

## 2023-12-14 DIAGNOSIS — M542 Cervicalgia: Secondary | ICD-10-CM | POA: Diagnosis not present

## 2023-12-14 DIAGNOSIS — M6281 Muscle weakness (generalized): Secondary | ICD-10-CM | POA: Diagnosis not present

## 2023-12-14 DIAGNOSIS — M2569 Stiffness of other specified joint, not elsewhere classified: Secondary | ICD-10-CM | POA: Diagnosis not present

## 2023-12-16 DIAGNOSIS — M5451 Vertebrogenic low back pain: Secondary | ICD-10-CM | POA: Diagnosis not present

## 2023-12-16 DIAGNOSIS — M542 Cervicalgia: Secondary | ICD-10-CM | POA: Diagnosis not present

## 2023-12-16 DIAGNOSIS — M2569 Stiffness of other specified joint, not elsewhere classified: Secondary | ICD-10-CM | POA: Diagnosis not present

## 2023-12-16 DIAGNOSIS — M6281 Muscle weakness (generalized): Secondary | ICD-10-CM | POA: Diagnosis not present

## 2023-12-21 DIAGNOSIS — M2569 Stiffness of other specified joint, not elsewhere classified: Secondary | ICD-10-CM | POA: Diagnosis not present

## 2023-12-21 DIAGNOSIS — M6281 Muscle weakness (generalized): Secondary | ICD-10-CM | POA: Diagnosis not present

## 2023-12-21 DIAGNOSIS — M5451 Vertebrogenic low back pain: Secondary | ICD-10-CM | POA: Diagnosis not present

## 2023-12-21 DIAGNOSIS — M542 Cervicalgia: Secondary | ICD-10-CM | POA: Diagnosis not present

## 2023-12-23 DIAGNOSIS — R531 Weakness: Secondary | ICD-10-CM | POA: Diagnosis not present

## 2023-12-23 DIAGNOSIS — M5451 Vertebrogenic low back pain: Secondary | ICD-10-CM | POA: Diagnosis not present

## 2023-12-23 DIAGNOSIS — M5416 Radiculopathy, lumbar region: Secondary | ICD-10-CM | POA: Diagnosis not present

## 2023-12-23 DIAGNOSIS — M5117 Intervertebral disc disorders with radiculopathy, lumbosacral region: Secondary | ICD-10-CM | POA: Diagnosis not present

## 2023-12-23 DIAGNOSIS — M542 Cervicalgia: Secondary | ICD-10-CM | POA: Diagnosis not present

## 2023-12-23 DIAGNOSIS — M48061 Spinal stenosis, lumbar region without neurogenic claudication: Secondary | ICD-10-CM | POA: Diagnosis not present

## 2023-12-23 DIAGNOSIS — M5116 Intervertebral disc disorders with radiculopathy, lumbar region: Secondary | ICD-10-CM | POA: Diagnosis not present

## 2023-12-23 DIAGNOSIS — M2569 Stiffness of other specified joint, not elsewhere classified: Secondary | ICD-10-CM | POA: Diagnosis not present

## 2023-12-23 DIAGNOSIS — M6281 Muscle weakness (generalized): Secondary | ICD-10-CM | POA: Diagnosis not present

## 2023-12-28 DIAGNOSIS — M6281 Muscle weakness (generalized): Secondary | ICD-10-CM | POA: Diagnosis not present

## 2023-12-28 DIAGNOSIS — M5451 Vertebrogenic low back pain: Secondary | ICD-10-CM | POA: Diagnosis not present

## 2023-12-28 DIAGNOSIS — M2569 Stiffness of other specified joint, not elsewhere classified: Secondary | ICD-10-CM | POA: Diagnosis not present

## 2023-12-28 DIAGNOSIS — M542 Cervicalgia: Secondary | ICD-10-CM | POA: Diagnosis not present

## 2024-01-01 DIAGNOSIS — M5451 Vertebrogenic low back pain: Secondary | ICD-10-CM | POA: Diagnosis not present

## 2024-01-01 DIAGNOSIS — M2569 Stiffness of other specified joint, not elsewhere classified: Secondary | ICD-10-CM | POA: Diagnosis not present

## 2024-01-01 DIAGNOSIS — M6281 Muscle weakness (generalized): Secondary | ICD-10-CM | POA: Diagnosis not present

## 2024-01-01 DIAGNOSIS — M542 Cervicalgia: Secondary | ICD-10-CM | POA: Diagnosis not present

## 2024-01-04 DIAGNOSIS — M5451 Vertebrogenic low back pain: Secondary | ICD-10-CM | POA: Diagnosis not present

## 2024-01-04 DIAGNOSIS — M2569 Stiffness of other specified joint, not elsewhere classified: Secondary | ICD-10-CM | POA: Diagnosis not present

## 2024-01-04 DIAGNOSIS — M6281 Muscle weakness (generalized): Secondary | ICD-10-CM | POA: Diagnosis not present

## 2024-01-04 DIAGNOSIS — M542 Cervicalgia: Secondary | ICD-10-CM | POA: Diagnosis not present

## 2024-01-11 DIAGNOSIS — M2569 Stiffness of other specified joint, not elsewhere classified: Secondary | ICD-10-CM | POA: Diagnosis not present

## 2024-01-11 DIAGNOSIS — M542 Cervicalgia: Secondary | ICD-10-CM | POA: Diagnosis not present

## 2024-01-11 DIAGNOSIS — M6281 Muscle weakness (generalized): Secondary | ICD-10-CM | POA: Diagnosis not present

## 2024-01-11 DIAGNOSIS — M5451 Vertebrogenic low back pain: Secondary | ICD-10-CM | POA: Diagnosis not present

## 2024-01-13 DIAGNOSIS — M6281 Muscle weakness (generalized): Secondary | ICD-10-CM | POA: Diagnosis not present

## 2024-01-13 DIAGNOSIS — M542 Cervicalgia: Secondary | ICD-10-CM | POA: Diagnosis not present

## 2024-01-13 DIAGNOSIS — M2569 Stiffness of other specified joint, not elsewhere classified: Secondary | ICD-10-CM | POA: Diagnosis not present

## 2024-01-13 DIAGNOSIS — M5451 Vertebrogenic low back pain: Secondary | ICD-10-CM | POA: Diagnosis not present

## 2024-01-18 DIAGNOSIS — M542 Cervicalgia: Secondary | ICD-10-CM | POA: Diagnosis not present

## 2024-01-18 DIAGNOSIS — M6281 Muscle weakness (generalized): Secondary | ICD-10-CM | POA: Diagnosis not present

## 2024-01-18 DIAGNOSIS — M2569 Stiffness of other specified joint, not elsewhere classified: Secondary | ICD-10-CM | POA: Diagnosis not present

## 2024-01-18 DIAGNOSIS — M5451 Vertebrogenic low back pain: Secondary | ICD-10-CM | POA: Diagnosis not present

## 2024-01-21 DIAGNOSIS — M5416 Radiculopathy, lumbar region: Secondary | ICD-10-CM | POA: Diagnosis not present

## 2024-01-21 DIAGNOSIS — Z6827 Body mass index (BMI) 27.0-27.9, adult: Secondary | ICD-10-CM | POA: Diagnosis not present

## 2024-01-22 DIAGNOSIS — M542 Cervicalgia: Secondary | ICD-10-CM | POA: Diagnosis not present

## 2024-01-22 DIAGNOSIS — M6281 Muscle weakness (generalized): Secondary | ICD-10-CM | POA: Diagnosis not present

## 2024-01-22 DIAGNOSIS — M2569 Stiffness of other specified joint, not elsewhere classified: Secondary | ICD-10-CM | POA: Diagnosis not present

## 2024-01-22 DIAGNOSIS — M5451 Vertebrogenic low back pain: Secondary | ICD-10-CM | POA: Diagnosis not present

## 2024-01-25 ENCOUNTER — Inpatient Hospital Stay: Payer: Medicare Other | Attending: Internal Medicine

## 2024-01-25 ENCOUNTER — Ambulatory Visit (HOSPITAL_COMMUNITY)
Admission: RE | Admit: 2024-01-25 | Discharge: 2024-01-25 | Disposition: A | Discharge status: Home / Self Care | Payer: 59 | Source: Ambulatory Visit | Attending: Physician Assistant | Admitting: Physician Assistant

## 2024-01-25 DIAGNOSIS — I700 Atherosclerosis of aorta: Secondary | ICD-10-CM | POA: Diagnosis not present

## 2024-01-25 DIAGNOSIS — C3492 Malignant neoplasm of unspecified part of left bronchus or lung: Secondary | ICD-10-CM

## 2024-01-25 DIAGNOSIS — R5383 Other fatigue: Secondary | ICD-10-CM | POA: Insufficient documentation

## 2024-01-25 DIAGNOSIS — C3412 Malignant neoplasm of upper lobe, left bronchus or lung: Secondary | ICD-10-CM | POA: Diagnosis not present

## 2024-01-25 DIAGNOSIS — Z9221 Personal history of antineoplastic chemotherapy: Secondary | ICD-10-CM | POA: Insufficient documentation

## 2024-01-25 DIAGNOSIS — Z923 Personal history of irradiation: Secondary | ICD-10-CM | POA: Insufficient documentation

## 2024-01-25 DIAGNOSIS — W19XXXA Unspecified fall, initial encounter: Secondary | ICD-10-CM | POA: Diagnosis not present

## 2024-01-25 DIAGNOSIS — J439 Emphysema, unspecified: Secondary | ICD-10-CM | POA: Diagnosis not present

## 2024-01-25 DIAGNOSIS — C3490 Malignant neoplasm of unspecified part of unspecified bronchus or lung: Secondary | ICD-10-CM | POA: Diagnosis not present

## 2024-01-25 LAB — CBC WITH DIFFERENTIAL (CANCER CENTER ONLY)
Abs Immature Granulocytes: 0.03 10*3/uL (ref 0.00–0.07)
Basophils Absolute: 0 10*3/uL (ref 0.0–0.1)
Basophils Relative: 1 %
Eosinophils Absolute: 0.2 10*3/uL (ref 0.0–0.5)
Eosinophils Relative: 4 %
HCT: 35.9 % — ABNORMAL LOW (ref 39.0–52.0)
Hemoglobin: 12.1 g/dL — ABNORMAL LOW (ref 13.0–17.0)
Immature Granulocytes: 1 %
Lymphocytes Relative: 24 %
Lymphs Abs: 1.5 10*3/uL (ref 0.7–4.0)
MCH: 32.1 pg (ref 26.0–34.0)
MCHC: 33.7 g/dL (ref 30.0–36.0)
MCV: 95.2 fL (ref 80.0–100.0)
Monocytes Absolute: 0.8 10*3/uL (ref 0.1–1.0)
Monocytes Relative: 12 %
Neutro Abs: 3.6 10*3/uL (ref 1.7–7.7)
Neutrophils Relative %: 58 %
Platelet Count: 282 10*3/uL (ref 150–400)
RBC: 3.77 MIL/uL — ABNORMAL LOW (ref 4.22–5.81)
RDW: 14 % (ref 11.5–15.5)
WBC Count: 6.2 10*3/uL (ref 4.0–10.5)
nRBC: 0 % (ref 0.0–0.2)

## 2024-01-25 LAB — CMP (CANCER CENTER ONLY)
ALT: 13 U/L (ref 0–44)
AST: 14 U/L — ABNORMAL LOW (ref 15–41)
Albumin: 4.1 g/dL (ref 3.5–5.0)
Alkaline Phosphatase: 84 U/L (ref 38–126)
Anion gap: 6 (ref 5–15)
BUN: 12 mg/dL (ref 8–23)
CO2: 26 mmol/L (ref 22–32)
Calcium: 9.1 mg/dL (ref 8.9–10.3)
Chloride: 107 mmol/L (ref 98–111)
Creatinine: 0.85 mg/dL (ref 0.61–1.24)
GFR, Estimated: >60 mL/min (ref >60)
Glucose, Bld: 95 mg/dL (ref 70–99)
Potassium: 3.9 mmol/L (ref 3.5–5.1)
Sodium: 139 mmol/L (ref 135–145)
Total Bilirubin: 0.4 mg/dL (ref 0.0–1.2)
Total Protein: 6.9 g/dL (ref 6.5–8.1)

## 2024-01-25 MED ORDER — IOHEXOL 300 MG/ML  SOLN
75.0000 mL | Freq: Once | INTRAMUSCULAR | Status: AC | PRN
  Administered 2024-01-25: 75 mL via INTRAVENOUS

## 2024-01-26 DIAGNOSIS — M6281 Muscle weakness (generalized): Secondary | ICD-10-CM | POA: Diagnosis not present

## 2024-01-26 DIAGNOSIS — M542 Cervicalgia: Secondary | ICD-10-CM | POA: Diagnosis not present

## 2024-01-26 DIAGNOSIS — M5451 Vertebrogenic low back pain: Secondary | ICD-10-CM | POA: Diagnosis not present

## 2024-01-26 DIAGNOSIS — M2569 Stiffness of other specified joint, not elsewhere classified: Secondary | ICD-10-CM | POA: Diagnosis not present

## 2024-01-26 NOTE — Progress Notes (Signed)
 Cardiology Office Note:    Date:  02/08/2024   ID:  Francisco Austin, DOB [birth year], MRN [id number]  PCP:  Noberto Retort, MD  Cardiologist:  Nanetta Batty, MD     Referring MD: Noberto Retort, MD   Chief Complaint: routine follow-up of CAD  History of Present Illness:    Francisco Austin is a 73 y.o. male with a history of CAD s/p DES x2 to RCA and DES x1 to LAD in 12/2021, hypertension, hyperlipidemia, pre-diabetes, COPD, chronic back pain, lung cancer, and tobacco abuse who is followed by Dr. Allyson Sabal and presents today for routine follow-up.   Patient was formerly followed by Dr. Donnie Aho but has followed with Dr. Allyson Sabal since 07/2019. He has a long history of CAD and underwent multiple cardiac catheterizations remote by Dr. Donnie Aho with PCI around the year 2000 but no stenting. 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 Coronary CTA was ordered in 12/2021 for further evaluation of atypical chest pain and showed a coronary calcium score of 1,970 (93rd percentile for age and sex) and moderate to severe multivessel CAD. This led to a cardiac catheterization on 12/23/2021 which showed 90% stenosis of proximal LAD and 80% stenosis of proximal to mid RCA. LVEF was 55-65% by visual estimate. He was brought back to the cath lab the next day and underwent orbital atherectomy of the proxima to mid RCA with overlapping DES x2 as well as DES of the LAD.   He was last seen by Dr. Allyson Sabal in 07/2023 at which time he was doing well with no recurrent angina.  Patient presents today for follow-up.  Here by himself.  Patient states he is doing well overall from a cardiac standpoint.  He does continue to have chronic chest pain that he calls his spasm pain.  He has had this pain since the 1990s and states it feels different than the pain he was having prior to his PCI in 2023.  This occurs on a weekly basis and resolves with a dose of sublingual nitroglycerin and Ativan.  This is  unchanged from his baseline.  He describes some dyspnea on exertion, especially if he is walking quickly, and states his endurance "sucks." However, he states this is not new and has been going on for years. No orthopnea or PND. He states his edema is much improved from before. No palpitations, lightheadedness, dizziness, or syncope.  He did have a fall about 3 weeks ago. He denies any symptoms prior to the falls. He is not exactly sure what caused him to fall but he thinks he turned around too quickly and then just lost his balance. He states he felt like he was falling in slow motion. He did hit the top of his head on the tile floor. He denies any neurologic symptoms since the fall. He denies any other falls and again denies any symptoms including palpitations, lightheadedness, dizziness prior to the fall.   Of note, he gained almost 25 lbs since his visit 6 months ago. However, he states this is true wait gain. He states his Oncologist told him he needed to gain weight so he has been drinking soda and eating unhealthy foods. He does not look volume overloaded on exam.   EKGs/Labs/Other Studies Reviewed:    The following studies were reviewed:  Coronary CTA 12/16/2021: Impression: 1. Moderate to severe multivessel CAD, CADRADS = 4a. CT FFR will be performed and reported separately. 2. Coronary calcium score  of 1970. This was 93rd percentile for age and sex matched control. 3. Normal coronary origin with right dominance. 4. Dilated main pulmonary artery at 31 mm, suggestive of pulmonary hypertension. 5. Posterior mitral annular calcification. 6. Lipomatous hypertrophy of the interatrial septum. 7. Aortic atherosclerosis. 8. Definitive cardiac catheterization is recommended. _______________   Left Cardiac Catheterization 12/23/2021:   Prox LAD lesion is 90% stenosed.   Prox RCA to Mid RCA lesion is 80% stenosed.   The left ventricular systolic function is normal.   LV end diastolic pressure  is normal.   The left ventricular ejection fraction is 55-65% by visual estimate.   Impressions: Mr. Colmenares has two-vessel disease with a 90% fairly focal proximal LAD stenosis at the takeoff of the first diagonal branch which I think can be fairly easily fixed with balloon and stenting. He also has a more complex proximal to mid RCA which is highly calcified and probably should best be treated with orbital atherectomy followed by PCI drug-eluting stenting. His circumflex has minimal disease. His LV function is normal. My TIG catheter dipped into his LV and most likely initiated a fast tachycardia which resolved when I withdrew the catheter. The patient's baseline EKG changed from narrow complex to appears to be a bundle branch block. He did develop chest pain with this and relative hypotension with systolic blood pressure in the 90s. Based on this, I elected not to proceed with intervention but rather will watch him overnight and arrange for Dr. Eldridge Dace to revascularize tomorrow. I did load him with 600 mg of Plavix and placed him on aspirin and clopidogrel in anticipation of his procedure. The sheath was removed and a TR band was placed on the right wrist to achieve patent hemostasis. The patient left lab in stable condition    _______________   Coronary Stent Intervention 12/24/2021:   Prox LAD lesion is 90% stenosed.   A drug-eluting stent was successfully placed using a STENT ONYX FRONTIER 3.5X12 and optimized with intravascular ultrasound.   Post intervention, there is a 0% residual stenosis.   Prox RCA to Mid RCA lesion is 80% stenosed.   Mid RCA lesion is 75% stenosed.  The RCA disease was treated with orbital atherectomy in the proximal to mid vessel   A drug-eluting stent was successfully placed using a SYNERGY XD 4.0X38 distally.   A drug-eluting stent was successfully placed using a SYNERGY XD 3.50X24 overlapping proximally.  There appeared to be some dye behind the stent.  This was  confirmed by intravascular ultrasound.  Echocardiogram did not show any perforation.   Post intervention, there is a 0% residual stenosis.   Post intervention, there is a 0% residual stenosis.   Complex intervention of the RCA.  Successful PCI of the LAD.  He will need dual antiplatelet therapy for at least 6 months.  I would strongly consider lifelong clopidogrel monotherapy given the calcific disease he has in his right coronary artery.  Continue aggressive secondary prevention.   Diagnostic Dominance: Right  Intervention     _______________   Limited Echocardiogram 12/24/2021: Conclusion(s)/Recommendation(s): Limited periprocedural study shows no  evidence of pericardial effusion.  EKG:  EKG ordered today.   EKG Interpretation Date/Time:  Monday February 08 2024 13:51:49 EDT Ventricular Rate:  72 PR Interval:  208 QRS Duration:  90 QT Interval:  410 QTC Calculation: 448 R Axis:   54  Text Interpretation: Normal sinus rhythm  Non-specific ST changes in inferior leads and leads V5-V6 No significant changes compared  to prior tracings Confirmed by Marjie Skiff [phone]) on 02/08/2024 1:59:40 PM    Recent Labs: 01/25/2024: ALT 13; BUN 12; Creatinine 0.85; Hemoglobin 12.1; Platelet Count 282; Potassium 3.9; Sodium 139  Recent Lipid Panel    Component Value Date/Time   CHOL 115 03/03/2022 0931   TRIG 67 03/03/2022 0931   HDL 40 03/03/2022 0931   CHOLHDL 2.9 03/03/2022 0931   LDLCALC 61 03/03/2022 0931    Physical Exam:    Vital Signs: BP 110/62 (BP Location: Left Arm, Patient Position: Sitting)   Pulse 76   Ht 5\' 10"  (1.778 m)   Wt 188 lb 12.8 oz (85.6 kg)   SpO2 93%   BMI 27.09 kg/m     Wt Readings from Last 3 Encounters:  02/08/24 188 lb 12.8 oz (85.6 kg)  02/01/24 189 lb 8 oz (86 kg)  07/28/23 165 lb 14.4 oz (75.3 kg)     General: 73 y.o. male in no acute distress. HEENT: Normocephalic and atraumatic. Sclera clear.  Neck: Supple. No carotid bruits. No  JVD. Heart: RRR. Distinct S1 and S2. No murmurs, gallops, or rubs.  Lungs: No increased work of breathing. Clear to ausculation bilaterally. No wheezes, rhonchi, or rales.  Extremities: Trace to mild lower extremity edema bilaterally.    Skin: Warm and dry. Neuro: No focal deficits. Psych: Normal affect. Responds appropriately.   Assessment:    1. Chronic chest pain   2. Coronary artery disease involving native coronary artery of native heart with angina pectoris (HCC)   3. Essential hypertension   4. Hyperlipidemia, unspecified hyperlipidemia type   5. Tobacco abuse   6. Fall, subsequent encounter   7. Weight gain     Plan:    Chronic Chest Pain CAD S/p orbital atherectomy and overlapping DES x2 to RCA as well as DES to LAD in 12/2021.  - Patient continue to have chronic chest pain that he has had since the 1990s and states feels different than the symptoms he was having prior to his PCI in 2023. Pain improves with sublingual Nitro and Ativan. Symptoms unchanged from baseline. - He has been maintained on DAPT with Aspirin and Plavix since PCI. Continue.  - Continue high-intensity statin. - Offered cardiac PET stress test but patient declined. He said "maybe at next visit." Advised patient to let us know if he has any new or worsening symptoms prior to next visit.    Hypertension BP well controlled.  - Continue currently medications: Telmisartan 80mg  daily and Diltiazem 240mg  daily.    Hyperlipidemia Last lipid panel in 08/2023: Total Cholesterol 127, Triglycerides 56, HDL 56, LDL 59. LDL goal <70 given CAD.  - Continue Lipitor 80mg  daily.    Tobacco Abuse Patient continues to smoke his "pipe." He has no interest in quitting at this time.   Fall Patient fell about 3 weeks ago. He denies any symptoms prior to the fall. He thinks he turned to quickly and then lost his balance. He did hit is head on the tile floor after the fall. He did not get any medical attention after this.  He denies any new neurologic symptoms following the fall.  - Offered head CT but patient declined. Discussed the importance of letting us know if he falls and hits his head again given he is on DAPT.   Weight Gain Patient has gained almost 25 lbs since his last office visit 6 months ago. However,  he thinks this is true wait gain. He states his Oncologist told  him he needed to gain weight so he has been drinking soda and eating unhealthy foods.  - He does not look volume overloaded on exam.  - Recommended daily weight and advised patient to let us know if he is gaining weight quickly (3lbs in 1 day or 5lb in 1 week) as this can be a sign of fluid overload.   Disposition: Follow up in 6 months.    Signed, Corrin Parker, PA-C  02/08/2024 5:15 PM    [facility]

## 2024-01-27 DIAGNOSIS — M5451 Vertebrogenic low back pain: Secondary | ICD-10-CM | POA: Diagnosis not present

## 2024-01-27 DIAGNOSIS — M2569 Stiffness of other specified joint, not elsewhere classified: Secondary | ICD-10-CM | POA: Diagnosis not present

## 2024-01-27 DIAGNOSIS — M542 Cervicalgia: Secondary | ICD-10-CM | POA: Diagnosis not present

## 2024-01-27 DIAGNOSIS — M6281 Muscle weakness (generalized): Secondary | ICD-10-CM | POA: Diagnosis not present

## 2024-02-01 ENCOUNTER — Inpatient Hospital Stay (HOSPITAL_BASED_OUTPATIENT_CLINIC_OR_DEPARTMENT_OTHER): Payer: Medicare Other | Admitting: Internal Medicine

## 2024-02-01 VITALS — BP 105/64 | HR 69 | Temp 97.6°F | Resp 16 | Wt 189.5 lb

## 2024-02-01 DIAGNOSIS — Z923 Personal history of irradiation: Secondary | ICD-10-CM | POA: Diagnosis not present

## 2024-02-01 DIAGNOSIS — R5383 Other fatigue: Secondary | ICD-10-CM | POA: Diagnosis not present

## 2024-02-01 DIAGNOSIS — C3490 Malignant neoplasm of unspecified part of unspecified bronchus or lung: Secondary | ICD-10-CM

## 2024-02-01 DIAGNOSIS — C3412 Malignant neoplasm of upper lobe, left bronchus or lung: Secondary | ICD-10-CM | POA: Diagnosis not present

## 2024-02-01 DIAGNOSIS — Z9221 Personal history of antineoplastic chemotherapy: Secondary | ICD-10-CM | POA: Diagnosis not present

## 2024-02-01 DIAGNOSIS — W19XXXA Unspecified fall, initial encounter: Secondary | ICD-10-CM | POA: Diagnosis not present

## 2024-02-01 NOTE — Progress Notes (Signed)
 [facility] [phone]   [phone]  OFFICE PROGRESS NOTE  Noberto Retort, MD [phone] [address] [place] [address]  DIAGNOSIS:  limited stage (T1c, N2, M0) small cell lung cancer with mixture of squamous cell carcinoma in the left upper lobe presented with left upper lobe lung nodule in addition to left hilar and mediastinal lymphadenopathy diagnosed in May 2023.  PRIOR THERAPY:  1) Systemic chemotherapy with cisplatin 75 Mg/M2 on day 1 and etoposide 100 Mg/M2 on days 1, 2 and 3 every 3 weeks for 4 cycles.  This was concurrent with radiotherapy for around 6 weeks during this course of treatment.  Status post 4 cycles.  Last cycle was given on 06/09/2022. 2) prophylactic cranial irradiation under the care of Dr. Kathrynn Running.  CURRENT THERAPY: Observation.  INTERVAL HISTORY: Francisco Austin 73 y.o. male returns to the clinic today for 30-month follow-up visit.Discussed the use of AI scribe software for clinical note transcription with the patient, who gave verbal consent to proceed.  History of Present Illness   Francisco Austin is a 73 year old male with limited stage small cell lung cancer and squamous cell carcinoma who presents for evaluation and repeat imaging for restaging of his disease.  He was diagnosed with limited stage small cell lung cancer with a mixture of squamous cell carcinoma in May 2023. He has completed four cycles of systemic chemotherapy with cisplatin and etoposide, concurrent with radiotherapy, followed by prophylactic cranial irradiation. Current imaging shows a 1.4 x 1.5 cm spot in the right upper lobe, which could represent inflammation or a new cancerous lesion. No residual disease is noted on the left side, and there is some scarring from radiation.  He feels 'cold all the time' and experienced a fall about a month ago, where he hit his head but did not seek medical attention. No recurrent falls since then. No chest  pain, breathing issues, nausea, vomiting, diarrhea, or weight loss. He has gained 12 pounds during treatment.        MEDICAL HISTORY: Past Medical History:  Diagnosis Date   Anxiety    Back pain    CAD (coronary artery disease)    COPD (chronic obstructive pulmonary disease) (HCC)    History of kidney stones    HLD (hyperlipidemia)    HTN (hypertension)    Pre-diabetes     ALLERGIES:  is allergic to gabapentin, morphine, other, prednisone, alprazolam, fentanyl, niacin, and penicillamine.  MEDICATIONS:  Current Outpatient Medications  Medication Sig Dispense Refill   albuterol (VENTOLIN HFA) 108 (90 Base) MCG/ACT inhaler Inhale 2 puffs into the lungs every 6 (six) hours as needed for wheezing or shortness of breath.     aspirin EC 81 MG tablet Take 81 mg by mouth daily.     atorvastatin (LIPITOR) 80 MG tablet Take 1 tablet (80 mg total) by mouth daily. 90 tablet 1   Cholecalciferol 50 MCG (2000 UT) TABS Take 2,000 Units by mouth daily.     clopidogrel (PLAVIX) 75 MG tablet TAKE ONE TABLET BY MOUTH ONCE DAILY WITH BREAKFAST 90 tablet 3   diclofenac sodium (VOLTAREN) 1 % GEL Apply 1 g topically daily as needed (pain).     diltiazem (TIAZAC) 240 MG 24 hr capsule Take 240 mg by mouth daily.     esomeprazole (NEXIUM) 40 MG capsule Take 40 mg by mouth daily.     guaiFENesin (ROBITUSSIN) 100 MG/5ML liquid Take 10 mLs by  mouth every 4 (four) hours as needed for cough or to loosen phlegm. 473 mL 5   HYDROcodone-acetaminophen (NORCO) 10-325 MG tablet Take 1-2 tablets by mouth every 6 (six) hours as needed.     LORazepam (ATIVAN) 1 MG tablet Take 1 mg by mouth 3 (three) times daily.     nitroGLYCERIN (NITROSTAT) 0.4 MG SL tablet Place 0.4 mg under the tongue every 5 (five) minutes as needed for chest pain.     prochlorperazine (COMPAZINE) 10 MG tablet TAKE ONE TABLET BY MOUTH EVERY 6 HOURS AS NEEDED 30 tablet 2   telmisartan (MICARDIS) 80 MG tablet Take 80 mg by mouth daily.      umeclidinium-vilanterol (ANORO ELLIPTA) 62.5-25 MCG/INH AEPB Inhale 1 puff into the lungs daily.     vitamin B-12 (CYANOCOBALAMIN) 500 MCG tablet Take 500 mcg by mouth daily.     No current facility-administered medications for this visit.   Facility-Administered Medications Ordered in Other Visits  Medication Dose Route Frequency Provider Last Rate Last Admin   prochlorperazine (COMPAZINE) 10 MG/2ML injection             SURGICAL HISTORY:  Past Surgical History:  Procedure Laterality Date   APPENDECTOMY     CORONARY STENT INTERVENTION N/A 12/24/2021   Procedure: CORONARY STENT INTERVENTION;  Surgeon: Corky Crafts, MD;  Location: [facility] INVASIVE CV LAB;  Service: Cardiovascular;  Laterality: N/A;   CORONARY ULTRASOUND/IVUS N/A 12/24/2021   Procedure: Intravascular Ultrasound/IVUS;  Surgeon: Corky Crafts, MD;  Location: [facility] INVASIVE CV LAB;  Service: Cardiovascular;  Laterality: N/A;   HEMORROIDECTOMY     LEFT HEART CATH AND CORONARY ANGIOGRAPHY N/A 12/23/2021   Procedure: LEFT HEART CATH AND CORONARY ANGIOGRAPHY;  Surgeon: Runell Gess, MD;  Location: [facility] INVASIVE CV LAB;  Service: Cardiovascular;  Laterality: N/A;   TONSILLECTOMY     VIDEO BRONCHOSCOPY WITH ENDOBRONCHIAL ULTRASOUND N/A 03/12/2022   Procedure: VIDEO BRONCHOSCOPY WITH ENDOBRONCHIAL ULTRASOUND;  Surgeon: Loreli Slot, MD;  Location: [facility] OR;  Service: Thoracic;  Laterality: N/A;    REVIEW OF SYSTEMS:  Constitutional: positive for fatigue Eyes: negative Ears, nose, mouth, throat, and face: negative Respiratory: negative Cardiovascular: negative Gastrointestinal: negative Genitourinary:negative Integument/breast: negative Hematologic/lymphatic: negative Musculoskeletal:negative Neurological: negative Behavioral/Psych: negative Endocrine: negative Allergic/Immunologic: negative   PHYSICAL EXAMINATION: General appearance: alert, cooperative, fatigued, and no distress Head: Normocephalic, without  obvious abnormality, atraumatic Neck: no adenopathy, no JVD, supple, symmetrical, trachea midline, and thyroid not enlarged, symmetric, no tenderness/mass/nodules Lymph nodes: Cervical, supraclavicular, and axillary nodes normal. Resp: clear to auscultation bilaterally Back: symmetric, no curvature. ROM normal. No CVA tenderness. Cardio: regular rate and rhythm, S1, S2 normal, no murmur, click, rub or gallop GI: soft, non-tender; bowel sounds normal; no masses,  no organomegaly Extremities: extremities normal, atraumatic, no cyanosis or edema Neurologic: Alert and oriented X 3, normal strength and tone. Normal symmetric reflexes. Normal coordination and gait  ECOG PERFORMANCE STATUS: 1 - Symptomatic but completely ambulatory  Blood pressure 105/64, pulse 69, temperature 97.6 F (36.4 C), resp. rate 16, weight 189 lb 8 oz (86 kg), SpO2 96%.  LABORATORY DATA: Lab Results  Component Value Date   WBC 6.2 01/25/2024   HGB 12.1 (L) 01/25/2024   HCT 35.9 (L) 01/25/2024   MCV 95.2 01/25/2024   PLT 282 01/25/2024      Chemistry      Component Value Date/Time   NA 139 01/25/2024 0754   NA 140 12/13/2021 0900   K 3.9 01/25/2024 0754   CL  107 01/25/2024 0754   CO2 26 01/25/2024 0754   BUN 12 01/25/2024 0754   BUN 9 12/13/2021 0900   CREATININE 0.85 01/25/2024 0754      Component Value Date/Time   CALCIUM 9.1 01/25/2024 0754   ALKPHOS 84 01/25/2024 0754   AST 14 (L) 01/25/2024 0754   ALT 13 01/25/2024 0754   BILITOT 0.4 01/25/2024 0754       RADIOGRAPHIC STUDIES: CT Chest W Contrast Result Date: 01/30/2024 CLINICAL DATA:  Small cell lung cancer (SCLC), assess treatment response. * Tracking Code: BO * EXAM: CT CHEST WITH CONTRAST TECHNIQUE: Multidetector CT imaging of the chest was performed during intravenous contrast administration. RADIATION DOSE REDUCTION: This exam was performed according to the departmental dose-optimization program which includes automated exposure control,  adjustment of the mA and/or kV according to patient size and/or use of iterative reconstruction technique. CONTRAST:  75mL OMNIPAQUE IOHEXOL 300 MG/ML  SOLN COMPARISON:  CT scan chest from 07/20/2023. FINDINGS: Cardiovascular: Normal cardiac size. No pericardial effusion. No aortic aneurysm. There are coronary artery calcifications, in keeping with coronary artery disease. There are also mild-to-moderate peripheral atherosclerotic vascular calcifications of thoracic aorta and its major branches. Mediastinum/Nodes: Visualized thyroid gland appears grossly unremarkable. No solid / cystic mediastinal masses. The esophagus is nondistended precluding optimal assessment. There are few mildly prominent mediastinal lymph nodes, which do not meet the size criteria for lymphadenopathy and appear grossly similar to the prior study, favoring benign etiology. No axillary lymphadenopathy by size criteria. Lungs/Pleura: The central tracheo-bronchial tree is patent. Mild upper lobe predominant centrilobular and paraseptal emphysematous changes noted. There are patchy areas of linear, plate-like atelectasis and/or scarring throughout bilateral lungs. There is new area of fibrosis/scarring in the horizontal linear distribution in the lingular segment of left upper lobe, compatible with postradiation fibrosis/scarring. There is persistent left hilar irregular soft tissue, similar to the prior study. There is an irregular 1.4 x 1.5 cm solid noncalcified nodule in the right lung upper lobe which is slowly increasing in size and density when compared to 2 prior exams. Otherwise, no new mass or consolidation. Stable appearance of focal cystic area with associated bronchiectasis in the left upper lobe (series 5, image 64). No pleural effusion or pneumothorax. Upper Abdomen: There are at least 2 nonobstructing calculi in the right kidney with largest measuring up to 5 x 7 mm. There is a single 2 mm nonobstructing calculus in the left kidney  upper pole. There is a small sliding hiatal hernia. Remaining visualized upper abdominal viscera within normal limits. Musculoskeletal: The visualized soft tissues of the chest wall are grossly unremarkable. No suspicious osseous lesions. There are mild multilevel degenerative changes in the visualized spine. IMPRESSION: 1. There is an irregular 1.4 x 1.5 cm solid noncalcified nodule in the right lung upper lobe which is slowly increasing in size and density when compared to the prior exams. Further evaluation with PET-CT scan versus short-term follow-up/tissue sampling is recommended. Otherwise, no new mass or consolidation. No new lymphadenopathy. 2. Multiple other nonacute observations, as described above. Aortic Atherosclerosis (ICD10-I70.0) and Emphysema (ICD10-J43.9). Electronically Signed   By: Jules Schick M.D.   On: 01/30/2024 18:05      ASSESSMENT AND PLAN: This is a very pleasant 73 years old white male diagnosed with limited stage (T1c, N2, M0) small cell lung cancer with mixture of squamous cell carcinoma in the left upper lobe presented with left upper lobe lung nodule in addition to left hilar and mediastinal lymphadenopathy diagnosed in May  2023.  The patient underwent systemic chemotherapy with cisplatin 75 Mg/M2 on day 1 and etoposide 100 Mg/M2 on days 1, 2 and 3 status post 4 cycles.  This is concurrent with radiotherapy.  Last dose of chemotherapy was on 06/09/2022.  This was followed by prophylactic cranial irradiation. The patient is currently on observation and he is feeling fine except for mild fatigue. He had repeat CT scan of the chest performed recently.  I personally and independently reviewed the scan images and discussed the results with the patient today.    Limited stage small cell lung cancer with squamous cell carcinoma Diagnosed in May 2023, he completed four cycles of cisplatin and etoposide chemotherapy with concurrent radiotherapy, followed by prophylactic cranial  irradiation. Recent imaging reveals a 1.4 x 1.5 cm lesion in the right upper lobe, potentially inflammatory or malignant. No residual disease on the left, but radiation-induced scarring is present. A PET scan is required to assess the right upper lobe lesion. - Order PET scan to evaluate the right upper lobe lesion - Schedule follow-up in three weeks to discuss PET scan results  Fall He experienced a fall one month ago, hitting his head, but did not seek medical attention. He reports no recurrent falls or symptoms since. - Monitor for new symptoms or recurrent falls   The patient was advised to call immediately if he has any other concerning symptoms in the interval.  The patient voices understanding of current disease status and treatment options and is in agreement with the current care plan.  All questions were answered. The patient knows to call the clinic with any problems, questions or concerns. We can certainly see the patient much sooner if necessary.  The total time spent in the appointment was 30 minutes.  Disclaimer: This note was dictated with voice recognition software. Similar sounding words can inadvertently be transcribed and may not be corrected upon review.

## 2024-02-08 ENCOUNTER — Encounter: Payer: Self-pay | Admitting: Student

## 2024-02-08 ENCOUNTER — Ambulatory Visit: Payer: 59 | Attending: Student | Admitting: Student

## 2024-02-08 ENCOUNTER — Encounter (HOSPITAL_COMMUNITY)
Admission: RE | Admit: 2024-02-08 | Discharge: 2024-02-08 | Disposition: A | Discharge status: Home / Self Care | Source: Ambulatory Visit | Attending: Internal Medicine | Admitting: Internal Medicine

## 2024-02-08 VITALS — BP 110/62 | HR 76 | Ht 70.0 in | Wt 188.8 lb

## 2024-02-08 DIAGNOSIS — E785 Hyperlipidemia, unspecified: Secondary | ICD-10-CM

## 2024-02-08 DIAGNOSIS — W19XXXD Unspecified fall, subsequent encounter: Secondary | ICD-10-CM

## 2024-02-08 DIAGNOSIS — C3490 Malignant neoplasm of unspecified part of unspecified bronchus or lung: Secondary | ICD-10-CM | POA: Insufficient documentation

## 2024-02-08 DIAGNOSIS — G8929 Other chronic pain: Secondary | ICD-10-CM

## 2024-02-08 DIAGNOSIS — R079 Chest pain, unspecified: Secondary | ICD-10-CM

## 2024-02-08 DIAGNOSIS — I25119 Atherosclerotic heart disease of native coronary artery with unspecified angina pectoris: Secondary | ICD-10-CM | POA: Diagnosis not present

## 2024-02-08 DIAGNOSIS — Z720 Tobacco use: Secondary | ICD-10-CM

## 2024-02-08 DIAGNOSIS — I10 Essential (primary) hypertension: Secondary | ICD-10-CM | POA: Diagnosis not present

## 2024-02-08 DIAGNOSIS — R635 Abnormal weight gain: Secondary | ICD-10-CM

## 2024-02-08 LAB — GLUCOSE, CAPILLARY: Glucose-Capillary: 95 mg/dL (ref 70–99)

## 2024-02-08 MED ORDER — FLUDEOXYGLUCOSE F - 18 (FDG) INJECTION
9.4000 | Freq: Once | INTRAVENOUS | Status: AC | PRN
  Administered 2024-02-08: 9.91 via INTRAVENOUS

## 2024-02-08 NOTE — Patient Instructions (Addendum)
 Medication Instructions:  The current medical regimen is effective;  continue present plan and medications as directed. Please refer to the Current Medication list given to you today.  *If you need a refill on your cardiac medications before your next appointment, please call your pharmacy*  Lab Work: NONE If you have labs (blood work) drawn today and your tests are completely normal, you will receive your results only by:  MyChart Message (if you have MyChart) OR A paper copy in the mail If you have any lab test that is abnormal or we need to change your treatment, we will call you to review the results.  Testing/Procedures: NONE  Follow-Up: At [facility], you and your health needs are our priority.  As part of our continuing mission to provide you with exceptional heart care, our providers are all part of one team.  This team includes your primary Cardiologist (physician) and Advanced Practice Providers or APPs (Physician Assistants and Nurse Practitioners) who all work together to provide you with the care you need, when you need it.  Your next appointment:   6 month(s)  Provider:   Nanetta Batty, MD or Marjie Skiff, PA-C          Other Instructions Heart Failure Education: Weigh yourself EVERY morning after you go to the bathroom but before you eat or drink anything. Write this number down in a weight log/diary. If you gain 3 pounds overnight or 5 pounds in a week, call the office. Take your medicines as prescribed. If you have concerns about your medications, please call us before you stop taking them.  Eat low salt foods--Limit salt (sodium) to 2000 mg per day. This will help prevent your body from holding onto fluid. Read food labels as many processed foods have a lot of sodium, especially canned goods and prepackaged meats. If you would like some assistance choosing low sodium foods, we would be happy to set you up with a nutritionist. Limit all fluids for the day to  less than 2 liters (64 ounces). Fluid includes all drinks, coffee, juice, ice chips, soup, jello, and all other liquids. Stay as active as you can everyday. Staying active will give you more energy and make your muscles stronger. Start with 5 minutes at a time and work your way up to 30 minutes a day. Break up your activities--do some in the morning and some in the afternoon. Start with 3 days per week and work your way up to 5 days as you can.  If you have chest pain, feel short of breath, dizzy, or lightheaded, STOP. If you don't feel better after a short rest, call 911. If you do feel better, call the office to let us know you have symptoms with exercise.      1st Floor: - Lobby - Registration  - Pharmacy  - Lab - Cafe  2nd Floor: - PV Lab - Diagnostic Testing (echo, CT, nuclear med)  3rd Floor: - Vacant  4th Floor: - TCTS (cardiothoracic surgery) - AFib Clinic - Structural Heart Clinic - Vascular Surgery  - Vascular Ultrasound  5th Floor: - HeartCare Cardiology (general and EP) - Clinical Pharmacy for coumadin, hypertension, lipid, weight-loss medications, and med management appointments    Valet parking services will be available as well.

## 2024-02-15 DIAGNOSIS — E7800 Pure hypercholesterolemia, unspecified: Secondary | ICD-10-CM | POA: Diagnosis not present

## 2024-02-15 DIAGNOSIS — E1169 Type 2 diabetes mellitus with other specified complication: Secondary | ICD-10-CM | POA: Diagnosis not present

## 2024-02-15 DIAGNOSIS — I10 Essential (primary) hypertension: Secondary | ICD-10-CM | POA: Diagnosis not present

## 2024-02-15 DIAGNOSIS — G894 Chronic pain syndrome: Secondary | ICD-10-CM | POA: Diagnosis not present

## 2024-03-02 ENCOUNTER — Other Ambulatory Visit: Payer: Self-pay | Admitting: *Deleted

## 2024-03-02 DIAGNOSIS — C3492 Malignant neoplasm of unspecified part of left bronchus or lung: Secondary | ICD-10-CM

## 2024-03-03 ENCOUNTER — Inpatient Hospital Stay: Attending: Internal Medicine

## 2024-03-03 ENCOUNTER — Inpatient Hospital Stay (HOSPITAL_BASED_OUTPATIENT_CLINIC_OR_DEPARTMENT_OTHER): Admitting: Internal Medicine

## 2024-03-03 VITALS — BP 106/64 | HR 71 | Temp 97.5°F | Resp 17 | Ht 70.0 in | Wt 186.9 lb

## 2024-03-03 DIAGNOSIS — C3490 Malignant neoplasm of unspecified part of unspecified bronchus or lung: Secondary | ICD-10-CM | POA: Diagnosis not present

## 2024-03-03 DIAGNOSIS — C3492 Malignant neoplasm of unspecified part of left bronchus or lung: Secondary | ICD-10-CM

## 2024-03-03 DIAGNOSIS — Z923 Personal history of irradiation: Secondary | ICD-10-CM | POA: Diagnosis not present

## 2024-03-03 DIAGNOSIS — R296 Repeated falls: Secondary | ICD-10-CM | POA: Insufficient documentation

## 2024-03-03 DIAGNOSIS — Z9221 Personal history of antineoplastic chemotherapy: Secondary | ICD-10-CM | POA: Diagnosis not present

## 2024-03-03 DIAGNOSIS — C3412 Malignant neoplasm of upper lobe, left bronchus or lung: Secondary | ICD-10-CM | POA: Insufficient documentation

## 2024-03-03 LAB — CBC WITH DIFFERENTIAL (CANCER CENTER ONLY)
Abs Immature Granulocytes: 0.02 10*3/uL (ref 0.00–0.07)
Basophils Absolute: 0 10*3/uL (ref 0.0–0.1)
Basophils Relative: 0 %
Eosinophils Absolute: 0.2 10*3/uL (ref 0.0–0.5)
Eosinophils Relative: 3 %
HCT: 39.3 % (ref 39.0–52.0)
Hemoglobin: 13.6 g/dL (ref 13.0–17.0)
Immature Granulocytes: 0 %
Lymphocytes Relative: 24 %
Lymphs Abs: 1.8 10*3/uL (ref 0.7–4.0)
MCH: 32.3 pg (ref 26.0–34.0)
MCHC: 34.6 g/dL (ref 30.0–36.0)
MCV: 93.3 fL (ref 80.0–100.0)
Monocytes Absolute: 0.8 10*3/uL (ref 0.1–1.0)
Monocytes Relative: 11 %
Neutro Abs: 4.5 10*3/uL (ref 1.7–7.7)
Neutrophils Relative %: 62 %
Platelet Count: 316 10*3/uL (ref 150–400)
RBC: 4.21 MIL/uL — ABNORMAL LOW (ref 4.22–5.81)
RDW: 14.2 % (ref 11.5–15.5)
WBC Count: 7.4 10*3/uL (ref 4.0–10.5)
nRBC: 0 % (ref 0.0–0.2)

## 2024-03-03 LAB — CMP (CANCER CENTER ONLY)
ALT: 14 U/L (ref 0–44)
AST: 13 U/L — ABNORMAL LOW (ref 15–41)
Albumin: 4.4 g/dL (ref 3.5–5.0)
Alkaline Phosphatase: 84 U/L (ref 38–126)
Anion gap: 9 (ref 5–15)
BUN: 13 mg/dL (ref 8–23)
CO2: 26 mmol/L (ref 22–32)
Calcium: 9.3 mg/dL (ref 8.9–10.3)
Chloride: 103 mmol/L (ref 98–111)
Creatinine: 0.94 mg/dL (ref 0.61–1.24)
GFR, Estimated: >60 mL/min (ref >60)
Glucose, Bld: 126 mg/dL — ABNORMAL HIGH (ref 70–99)
Potassium: 3.9 mmol/L (ref 3.5–5.1)
Sodium: 138 mmol/L (ref 135–145)
Total Bilirubin: 0.4 mg/dL (ref 0.0–1.2)
Total Protein: 7.3 g/dL (ref 6.5–8.1)

## 2024-03-03 NOTE — Progress Notes (Signed)
 [facility] [phone]   Fax:([phone]) [phone]  OFFICE PROGRESS NOTE  Roselind Congo, MD [phone] [place] [address]  DIAGNOSIS:  limited stage (T1c, N2, M0) small cell lung cancer with mixture of squamous cell carcinoma in the left upper lobe presented with left upper lobe lung nodule in addition to left hilar and mediastinal lymphadenopathy diagnosed in May 2023.  PRIOR THERAPY:  1) Systemic chemotherapy with cisplatin  75 Mg/M2 on day 1 and etoposide  100 Mg/M2 on days 1, 2 and 3 every 3 weeks for 4 cycles.  This was concurrent with radiotherapy for around 6 weeks during this course of treatment.  Status post 4 cycles.  Last cycle was given on 06/09/2022. 2) prophylactic cranial irradiation under the care of Dr. Lorri Rota.  CURRENT THERAPY: Observation.  INTERVAL HISTORY: Francisco Austin 73 y.o. male returns to the clinic today for 41-month follow-up visit.Discussed the use of AI scribe software for clinical note transcription with the patient, who gave verbal consent to proceed.  History of Present Illness   Francisco Austin is a 73 year old male with limited stage small cell lung cancer who presents for evaluation and repeat PET scan.  He was diagnosed with limited stage small cell lung cancer in May 2023 and underwent systemic chemotherapy with cisplatin  and etoposide , concurrent radiotherapy, and prophylactic cranial irradiation. He has been on observation since August 2023.  Currently, he is being evaluated due to irregular nodularity seen in the right upper lobe of the lung on a CT scan in March 2025, prompting further investigation with a repeat PET scan.  He experiences frequent falls and muscle strains, which he attributes to the effects of radiation. His mobility is limited, and he notes a significant decrease in energy levels, stating 'I just ain't got no energy.' Previously, he was active, attending physical therapy and playing  golf twice a week, but has noticed a sudden decline in strength and energy, describing it as 'like a light switch.'  He has a history of undergoing biopsies, with the last one performed by Dr. Luna Salinas, who also conducted a bronchoscopy. He is uncertain about the details of his previous procedures but recalls being involved with multiple doctors.  He mentions ongoing issues with hair loss, which he attributes to years of radiation treatment.         MEDICAL HISTORY: Past Medical History:  Diagnosis Date   Anxiety    Back pain    CAD (coronary artery disease)    COPD (chronic obstructive pulmonary disease) (HCC)    History of kidney stones    HLD (hyperlipidemia)    HTN (hypertension)    Pre-diabetes     ALLERGIES:  is allergic to gabapentin, morphine, other, prednisone, alprazolam, fentanyl , niacin, and penicillamine.  MEDICATIONS:  Current Outpatient Medications  Medication Sig Dispense Refill   albuterol  (VENTOLIN  HFA) 108 (90 Base) MCG/ACT inhaler Inhale 2 puffs into the lungs every 6 (six) hours as needed for wheezing or shortness of breath.     aspirin  EC 81 MG tablet Take 81 mg by mouth daily.     atorvastatin  (LIPITOR ) 80 MG tablet Take 1 tablet (80 mg total) by mouth daily. 90 tablet 1   Cholecalciferol 50 MCG (2000 UT) TABS Take 2,000 Units by mouth daily.     clopidogrel  (PLAVIX ) 75 MG tablet TAKE ONE TABLET BY MOUTH ONCE DAILY WITH BREAKFAST 90 tablet 3   diclofenac sodium (VOLTAREN) 1 %  GEL Apply 1 g topically daily as needed (pain).     diltiazem  (TIAZAC ) 240 MG 24 hr capsule Take 240 mg by mouth daily.     esomeprazole (NEXIUM) 40 MG capsule Take 40 mg by mouth daily.     guaiFENesin  (ROBITUSSIN) 100 MG/5ML liquid Take 10 mLs by mouth every 4 (four) hours as needed for cough or to loosen phlegm. 473 mL 5   HYDROcodone -acetaminophen  (NORCO) 10-325 MG tablet Take 1-2 tablets by mouth every 6 (six) hours as needed.     LORazepam  (ATIVAN ) 1 MG tablet Take 1 mg by  mouth 3 (three) times daily.     nitroGLYCERIN  (NITROSTAT ) 0.4 MG SL tablet Place 0.4 mg under the tongue every 5 (five) minutes as needed for chest pain.     prochlorperazine  (COMPAZINE ) 10 MG tablet TAKE ONE TABLET BY MOUTH EVERY 6 HOURS AS NEEDED 30 tablet 2   telmisartan (MICARDIS) 80 MG tablet Take 80 mg by mouth daily.     umeclidinium-vilanterol (ANORO ELLIPTA) 62.5-25 MCG/INH AEPB Inhale 1 puff into the lungs daily.     vitamin B-12 (CYANOCOBALAMIN) 500 MCG tablet Take 500 mcg by mouth daily.     No current facility-administered medications for this visit.   Facility-Administered Medications Ordered in Other Visits  Medication Dose Route Frequency Provider Last Rate Last Admin   prochlorperazine  (COMPAZINE ) 10 MG/2ML injection             SURGICAL HISTORY:  Past Surgical History:  Procedure Laterality Date   APPENDECTOMY     CORONARY STENT INTERVENTION N/A 12/24/2021   Procedure: CORONARY STENT INTERVENTION;  Surgeon: Lucendia Rusk, MD;  Location: [facility] INVASIVE CV LAB;  Service: Cardiovascular;  Laterality: N/A;   CORONARY ULTRASOUND/IVUS N/A 12/24/2021   Procedure: Intravascular Ultrasound/IVUS;  Surgeon: Lucendia Rusk, MD;  Location: [facility] INVASIVE CV LAB;  Service: Cardiovascular;  Laterality: N/A;   HEMORROIDECTOMY     LEFT HEART CATH AND CORONARY ANGIOGRAPHY N/A 12/23/2021   Procedure: LEFT HEART CATH AND CORONARY ANGIOGRAPHY;  Surgeon: Avanell Leigh, MD;  Location: [facility] INVASIVE CV LAB;  Service: Cardiovascular;  Laterality: N/A;   TONSILLECTOMY     VIDEO BRONCHOSCOPY WITH ENDOBRONCHIAL ULTRASOUND N/A 03/12/2022   Procedure: VIDEO BRONCHOSCOPY WITH ENDOBRONCHIAL ULTRASOUND;  Surgeon: Zelphia Higashi, MD;  Location: [facility] OR;  Service: Thoracic;  Laterality: N/A;    REVIEW OF SYSTEMS:  Constitutional: positive for fatigue Eyes: negative Ears, nose, mouth, throat, and face: negative Respiratory: negative Cardiovascular: negative Gastrointestinal:  negative Genitourinary:negative Integument/breast: negative Hematologic/lymphatic: negative Musculoskeletal:negative Neurological: negative Behavioral/Psych: negative Endocrine: negative Allergic/Immunologic: negative   PHYSICAL EXAMINATION: General appearance: alert, cooperative, fatigued, and no distress Head: Normocephalic, without obvious abnormality, atraumatic Neck: no adenopathy, no JVD, supple, symmetrical, trachea midline, and thyroid  not enlarged, symmetric, no tenderness/mass/nodules Lymph nodes: Cervical, supraclavicular, and axillary nodes normal. Resp: clear to auscultation bilaterally Back: symmetric, no curvature. ROM normal. No CVA tenderness. Cardio: regular rate and rhythm, S1, S2 normal, no murmur, click, rub or gallop GI: soft, non-tender; bowel sounds normal; no masses,  no organomegaly Extremities: extremities normal, atraumatic, no cyanosis or edema Neurologic: Alert and oriented X 3, normal strength and tone. Normal symmetric reflexes. Normal coordination and gait  ECOG PERFORMANCE STATUS: 1 - Symptomatic but completely ambulatory  Blood pressure 106/64, pulse 71, temperature (!) 97.5 F (36.4 C), temperature source Temporal, resp. rate 17, height 5\' 10"  (1.778 m), weight 186 lb 14.4 oz (84.8 kg), SpO2 95%.  LABORATORY DATA: Lab Results  Component Value  Date   WBC 7.4 03/03/2024   HGB 13.6 03/03/2024   HCT 39.3 03/03/2024   MCV 93.3 03/03/2024   PLT 316 03/03/2024      Chemistry      Component Value Date/Time   NA 139 01/25/2024 0754   NA 140 12/13/2021 0900   K 3.9 01/25/2024 0754   CL 107 01/25/2024 0754   CO2 26 01/25/2024 0754   BUN 12 01/25/2024 0754   BUN 9 12/13/2021 0900   CREATININE 0.85 01/25/2024 0754      Component Value Date/Time   CALCIUM  9.1 01/25/2024 0754   ALKPHOS 84 01/25/2024 0754   AST 14 (L) 01/25/2024 0754   ALT 13 01/25/2024 0754   BILITOT 0.4 01/25/2024 0754       RADIOGRAPHIC STUDIES: NM PET Image Restage  (PS) Skull Base to Thigh (F-18 FDG) Result Date: 02/19/2024 CLINICAL DATA:  Subsequent treatment strategy for non-small cell lung cancer. EXAM: NUCLEAR MEDICINE PET SKULL BASE TO THIGH TECHNIQUE: 9.9 mCi F-18 FDG was injected intravenously. Full-ring PET imaging was performed from the skull base to thigh after the radiotracer. CT data was obtained and used for attenuation correction and anatomic localization. Fasting blood glucose: 95 mg/dl COMPARISON:  91/47/8295 FINDINGS: Mediastinal blood pool activity: SUV max 2.4 Liver activity: SUV max NA NECK: Symmetric and likely physiologic activity along the palatine tonsils. Symmetric glottic activity. Incidental CT findings: Bilateral common carotid atheromatous vascular calcification. CHEST: Part solid 1.4 by 1.1 cm right upper lobe nodule on image 67 series 4 has maximum SUV of 10.5, compatible with malignancy. Cystic lung lesion in the left upper lobe on image 77 series 4 measuring about 1.9 by 1.5 cm has some minimal thickening of the margins and maximum SUV 1.4. Mild enlargement compared to 02/26/2022. Surveillance for low-grade adenocarcinoma suggested. Previous left lower lobe infrahilar hypermetabolic nodule no longer visible. Benign hypermetabolic activity along the lipomatous interatrial septum. Incidental CT findings: Coronary, aortic arch, and branch vessel atherosclerotic vascular disease. Mitral valve calcification. Emphysema. Scarring in the left lung likely from prior radiation therapy. ABDOMEN/PELVIS: No significant abnormal hypermetabolic activity in this region. Incidental CT findings: Atherosclerosis is present, including aortoiliac atherosclerotic disease. Nonobstructive right nephrolithiasis. Prominent stool throughout the colon favors constipation. SKELETON: No significant abnormal hypermetabolic activity in this region. Incidental CT findings: None. IMPRESSION: 1. Part solid 1.4 by 1.1 cm right upper lobe nodule has maximum SUV of 10.5,  compatible with malignancy. 2. Cystic lung lesion in the left upper lobe measuring about 1.9 by 1.5 cm has some minimal thickening of the margins and maximum SUV 1.4. Mild enlargement compared to 02/26/2022. Surveillance for low-grade adenocarcinoma suggested. 3. Previous left lower lobe infrahilar hypermetabolic nodule no longer visible. 4. Aortic Atherosclerosis (ICD10-I70.0) and Emphysema (ICD10-J43.9). 5. Coronary, aortic arch, and branch vessel atherosclerotic vascular disease. Mitral valve calcification. 6. Nonobstructive right nephrolithiasis. 7. Prominent stool throughout the colon favors constipation. Electronically Signed   By: Freida Jes M.D.   On: 02/19/2024 12:37      ASSESSMENT AND PLAN: This is a very pleasant 73 years old white male diagnosed with limited stage (T1c, N2, M0) small cell lung cancer with mixture of squamous cell carcinoma in the left upper lobe presented with left upper lobe lung nodule in addition to left hilar and mediastinal lymphadenopathy diagnosed in May 2023.  The patient underwent systemic chemotherapy with cisplatin  75 Mg/M2 on day 1 and etoposide  100 Mg/M2 on days 1, 2 and 3 status post 4 cycles.  This is concurrent  with radiotherapy.  Last dose of chemotherapy was on 06/09/2022.  This was followed by prophylactic cranial irradiation. The patient is currently on observation and he is feeling fine except for mild fatigue.    Limited stage small cell lung cancer Limited stage small cell lung cancer diagnosed in May 2023. Status post systemic chemotherapy with cisplatin  and etoposide , concurrent radiotherapy, and prophylactic cranial irradiation. Currently under observation since August 2023. Recent PET scan shows activity in a nodule in the right upper lobe, which could indicate cancer, possibly related to the initial cancer or a different type. - Refer to pulmonologist for biopsy of the right lung nodule - Order MRI of the brain to rule out  metastasis  Irregular nodularity in right upper lobe of lung Irregular nodularity in the right upper lobe of the lung noted on CT scan in March 2025. PET scan shows activity, raising suspicion for malignancy. Differential includes recurrence of small cell lung cancer or a new primary cancer. - Refer to pulmonologist for biopsy of the right lung nodule  Fatigue Complaints of lack of energy and fatigue. Previously improved with physical therapy but has worsened recently. Possible concern for brain metastasis affecting energy levels. - Order MRI of the brain to rule out metastasis  Mobility issues Mobility issues with frequent falls and muscle strains. Previously improved with physical therapy but has worsened recently. Possible concern for brain metastasis affecting mobility. - Order MRI of the brain to rule out metastasis   The patient was advised to call immediately if he has any other concerning symptoms in the interval. The patient voices understanding of current disease status and treatment options and is in agreement with the current care plan.  All questions were answered. The patient knows to call the clinic with any problems, questions or concerns. We can certainly see the patient much sooner if necessary.  The total time spent in the appointment was 30 minutes.  Disclaimer: This note was dictated with voice recognition software. Similar sounding words can inadvertently be transcribed and may not be corrected upon review.

## 2024-03-04 ENCOUNTER — Telehealth: Payer: Self-pay

## 2024-03-04 NOTE — Telephone Encounter (Signed)
 Patient called reporting increased right-sided pain described as worsening with deep breaths and right arm movement. Also reports that shortness of breath is "a little worse" and anxiety is "out the door." Patient believes the pain may be from a pulled muscle about 3 weeks ago but states that it has not improved. He has been using heat, Voltaren gel, Norco, and Lorazepam  for symptom relief.  Patient informed that the recommendation is to be evaluated in the ER for further assessment.  Patient also reported that his PCP has recommended Dr. Marguerita Shih take over prescribing Norco and Lorazepam . Patient was advised that this message would be relayed to Dr. Marguerita Shih for review. He was informed that our office provides palliative care for pain management, and Dr. Bing Buff recommendations will be obtained regarding this matter.  Review of the [place] PMP database indicates that the patient was dispensed 150 Norco on 4/10 and 75 Lorazepam  on 4/11.

## 2024-03-08 NOTE — Telephone Encounter (Signed)
 Spoke with patient regarding Dr. Bing Buff recommendations. Per Dr. Marguerita Shih, pain medications may continue to be prescribed by the PCP until a cancer-related issue is ruled out. Brain MRI may be performed sooner to evaluate for possible brain metastasis. Patient will be re-evaluated following the MRI and referred to palliative care if appropriate.  Brain MRI rescheduled to 5/12 at 1030. Patient verbalized understanding of the plan.

## 2024-03-10 ENCOUNTER — Ambulatory Visit
Attending: Thoracic Surgery (Cardiothoracic Vascular Surgery) | Admitting: Thoracic Surgery (Cardiothoracic Vascular Surgery)

## 2024-03-10 VITALS — BP 103/68 | HR 79 | Resp 20 | Ht 70.0 in | Wt 188.0 lb

## 2024-03-10 DIAGNOSIS — R918 Other nonspecific abnormal finding of lung field: Secondary | ICD-10-CM

## 2024-03-10 NOTE — Progress Notes (Signed)
 PCP is Roselind Congo, MD Referring Provider is Marlene Simas, MD  Chief Complaint  Patient presents with   Lung Lesion    Surgical consult, PET Scan 02/08/24/ Chest CT 01/25/24/ MR Brain sch'ed for 03/14/24    HPI: [name] is sent for consultation regarding a new right upper lobe lung nodule.  Francisco Austin is a 73 year old male with a history of tobacco abuse, limited stage small cell lung cancer, squamous cell carcinoma of the lung, COPD, hypertension, hyperlipidemia, coronary artery disease, and DES x 3.  Diagnosed with limited stage small cell carcinoma in 2023.  Also noted to have a small endobronchial squamous cell carcinoma at the time of bronchoscopy.  Treated with chemoradiation.  Recently had a follow-up CT scan which showed a new subsolid nodule in the right upper lobe.  On PET/CT the nodule is hypermetabolic with an SUV of 10.  Also little bit of activity associated with a cystic lesion in the left upper lobe with an SUV of 1.4.  No mediastinal or hilar adenopathy.  No change in appetite or weight loss.  He has been having chest pain.  He says the frequency has decreased since he had stents put in a couple years ago but can be severe at times.  He does have 3 stents and is on Plavix .  Has had some dizzy spells and recently had a fall.  Zubrod Score: At the time of surgery this patient's most appropriate activity status/level should be described as: []     0    Normal activity, no symptoms []     1    Restricted in physical strenuous activity but ambulatory, able to do out light work [x]     2    Ambulatory and capable of self care, unable to do work activities, up and about >50 % of waking hours                              []     3    Only limited self care, in bed greater than 50% of waking hours []     4    Completely disabled, no self care, confined to bed or chair []     5    Moribund  Past Medical History:  Diagnosis Date   Anxiety    Back pain    CAD (coronary artery  disease)    COPD (chronic obstructive pulmonary disease) (HCC)    History of kidney stones    HLD (hyperlipidemia)    HTN (hypertension)    Pre-diabetes     Past Surgical History:  Procedure Laterality Date   APPENDECTOMY     CORONARY STENT INTERVENTION N/A 12/24/2021   Procedure: CORONARY STENT INTERVENTION;  Surgeon: Lucendia Rusk, MD;  Location: [facility];  Service: Cardiovascular;  Laterality: N/A;   CORONARY ULTRASOUND/IVUS N/A 12/24/2021   Procedure: Intravascular Ultrasound/IVUS;  Surgeon: Lucendia Rusk, MD;  Location: [facility]IVE [facility];  Service: Cardiovascular;  Laterality: N/A;   HEMORROIDECTOMY     LEFT HEART CATH AND CORONARY ANGIOGRAPHY N/A 12/23/2021   Procedure: LEFT HEART CATH AND CORONARY ANGIOGRAPHY;  Surgeon: Avanell Leigh, MD;  Location: [facility];  Service: Cardiovascular;  Laterality: N/A;   TONSILLECTOMY     VIDEO BRONCHOSCOPY WITH ENDOBRONCHIAL ULTRASOUND N/A 03/12/2022   Procedure: VIDEO BRONCHOSCOPY WITH ENDOBRONCHIAL ULTRASOUND;  Surgeon: Zelphia Higashi, MD;  Location: [facility];  Service: Thoracic;  Laterality:  N/A;    Family History  Problem Relation Age of Onset   Heart failure Mother    Cancer Father     Social History Social History   Tobacco Use   Smoking status: Every Day    Types: Pipe   Smokeless tobacco: Never  Vaping Use   Vaping status: Never Used  Substance Use Topics   Alcohol use: Never   Drug use: Never    Current Outpatient Medications  Medication Sig Dispense Refill   albuterol  (VENTOLIN  HFA) 108 (90 Base) MCG/ACT inhaler Inhale 2 puffs into the lungs every 6 (six) hours as needed for wheezing or shortness of breath.     aspirin  EC 81 MG tablet Take 81 mg by mouth daily.     atorvastatin  (LIPITOR ) 80 MG tablet Take 1 tablet (80 mg total) by mouth daily. 90 tablet 1   Cholecalciferol 50 MCG (2000 UT) TABS Take 2,000 Units by mouth daily.     clopidogrel  (PLAVIX ) 75 MG tablet TAKE ONE  TABLET BY MOUTH ONCE DAILY WITH BREAKFAST 90 tablet 3   diclofenac sodium (VOLTAREN) 1 % GEL Apply 1 g topically daily as needed (pain).     diltiazem  (TIAZAC ) 240 MG 24 hr capsule Take 240 mg by mouth daily.     esomeprazole (NEXIUM) 40 MG capsule Take 40 mg by mouth daily.     HYDROcodone -acetaminophen  (NORCO) 10-325 MG tablet Take 1-2 tablets by mouth every 6 (six) hours as needed.     LORazepam  (ATIVAN ) 1 MG tablet Take 1 mg by mouth 3 (three) times daily.     nitroGLYCERIN  (NITROSTAT ) 0.4 MG SL tablet Place 0.4 mg under the tongue every 5 (five) minutes as needed for chest pain.     telmisartan (MICARDIS) 80 MG tablet Take 80 mg by mouth daily.     umeclidinium-vilanterol (ANORO ELLIPTA) 62.5-25 MCG/INH AEPB Inhale 1 puff into the lungs daily.     vitamin B-12 (CYANOCOBALAMIN) 500 MCG tablet Take 500 mcg by mouth daily.     guaiFENesin  (ROBITUSSIN) 100 MG/5ML liquid Take 10 mLs by mouth every 4 (four) hours as needed for cough or to loosen phlegm. 473 mL 5   prochlorperazine  (COMPAZINE ) 10 MG tablet TAKE ONE TABLET BY MOUTH EVERY 6 HOURS AS NEEDED 30 tablet 2   No current facility-administered medications for this visit.   Facility-Administered Medications Ordered in Other Visits  Medication Dose Route Frequency Provider Last Rate Last Admin   prochlorperazine  (COMPAZINE ) 10 MG/2ML injection             Allergies  Allergen Reactions   Gabapentin Anaphylaxis and Other (See Comments)    B/P dropped to "40"    Morphine Other (See Comments)    Burn his veins   Other Anaphylaxis    Androgenic Anabolic Steroid   Prednisone Anaphylaxis   Alprazolam Other (See Comments)    Other reaction(s): weirds him out   Fentanyl      Does not relieve pain for him    Niacin Other (See Comments)    Body feel weird   Penicillamine Hives    Review of Systems  Constitutional:  Positive for activity change and fatigue. Negative for unexpected weight change.  HENT:  Negative for trouble swallowing  and voice change.   Respiratory:  Positive for cough and shortness of breath.   Cardiovascular:  Positive for chest pain and leg swelling.  Musculoskeletal:  Positive for arthralgias, joint swelling and myalgias.       Chronic pain  Neurological:  Positive for dizziness, numbness and headaches.       Memory loss  Hematological:  Bruises/bleeds easily.  Psychiatric/Behavioral:  The patient is nervous/anxious.     BP 103/68   Pulse 79   Resp 20   Ht 5\' 10"  (1.778 m)   Wt 188 lb (85.3 kg)   SpO2 93% Comment: RA  BMI 26.98 kg/m  Physical Exam Vitals reviewed.  Constitutional:      General: He is not in acute distress.    Appearance: Normal appearance.  HENT:     Head: Normocephalic and atraumatic.  Eyes:     Extraocular Movements: Extraocular movements intact.  Cardiovascular:     Rate and Rhythm: Normal rate and regular rhythm.     Heart sounds: Normal heart sounds. No murmur heard.    No friction rub. No gallop.  Pulmonary:     Effort: Pulmonary effort is normal.     Breath sounds: No wheezing or rales.     Comments: Diminished breath sounds bilaterally Abdominal:     General: There is no distension.     Palpations: Abdomen is soft.  Musculoskeletal:     Cervical back: Neck supple.  Lymphadenopathy:     Cervical: No cervical adenopathy.  Skin:    General: Skin is warm and dry.     Comments: Multiple ecchymoses  Neurological:     General: No focal deficit present.     Mental Status: He is alert and oriented to person, place, and time.     Cranial Nerves: No cranial nerve deficit.     Motor: No weakness.    Diagnostic Tests: NUCLEAR MEDICINE PET SKULL BASE TO THIGH   TECHNIQUE: 9.9 mCi F-18 FDG was injected intravenously. Full-ring PET imaging was performed from the skull base to thigh after the radiotracer. CT data was obtained and used for attenuation correction and anatomic localization.   Fasting blood glucose: 95 mg/dl   COMPARISON:  13/06/6577    FINDINGS: Mediastinal blood pool activity: SUV max 2.4   Liver activity: SUV max NA   NECK: Symmetric and likely physiologic activity along the palatine tonsils. Symmetric glottic activity.   Incidental CT findings: Bilateral common carotid atheromatous vascular calcification.   CHEST: Part solid 1.4 by 1.1 cm right upper lobe nodule on image 67 series 4 has maximum SUV of 10.5, compatible with malignancy.   Cystic lung lesion in the left upper lobe on image 77 series 4 measuring about 1.9 by 1.5 cm has some minimal thickening of the margins and maximum SUV 1.4. Mild enlargement compared to 02/26/2022. Surveillance for low-grade adenocarcinoma suggested.   Previous left lower lobe infrahilar hypermetabolic nodule no longer visible.   Benign hypermetabolic activity along the lipomatous interatrial septum.   Incidental CT findings: Coronary, aortic arch, and branch vessel atherosclerotic vascular disease. Mitral valve calcification. Emphysema. Scarring in the left lung likely from prior radiation therapy.   ABDOMEN/PELVIS: No significant abnormal hypermetabolic activity in this region.   Incidental CT findings: Atherosclerosis is present, including aortoiliac atherosclerotic disease. Nonobstructive right nephrolithiasis. Prominent stool throughout the colon favors constipation.   SKELETON: No significant abnormal hypermetabolic activity in this region.   Incidental CT findings: None.   IMPRESSION: 1. Part solid 1.4 by 1.1 cm right upper lobe nodule has maximum SUV of 10.5, compatible with malignancy. 2. Cystic lung lesion in the left upper lobe measuring about 1.9 by 1.5 cm has some minimal thickening of the margins and maximum SUV 1.4. Mild enlargement compared to  02/26/2022. Surveillance for low-grade adenocarcinoma suggested. 3. Previous left lower lobe infrahilar hypermetabolic nodule no longer visible. 4. Aortic Atherosclerosis (ICD10-I70.0) and Emphysema  (ICD10-J43.9). 5. Coronary, aortic arch, and branch vessel atherosclerotic vascular disease. Mitral valve calcification. 6. Nonobstructive right nephrolithiasis. 7. Prominent stool throughout the colon favors constipation.     Electronically Signed   By: Freida Jes M.D.   On: 02/19/2024 12:37 I personally reviewed the CT and PET/CT images.  There is a new 1.7 x 1.5 cm mixed density nodule in the right upper lobe that is markedly hypermetabolic with an SUV of 10.5.  Some progression of a cystic central left upper lobe nodule with irregular wall thickness and mild uptake with SUV of 1.4.  Extensive aortic, great vessel, and coronary atherosclerosis.  Impression: Rodrigus Nafus is a 73 year old male with a history of tobacco abuse, limited stage small cell lung cancer, squamous cell carcinoma of the lung, COPD, hypertension, hyperlipidemia, coronary artery disease, and DES x 3.  Now presents with a new right upper lobe nodule as well as some very slow progression of a cystic lesion in the left upper lobe.  Right upper lobe nodule-question whether this is a new primary or recurrence of his small cell.  Based on appearance is a good possibility this is a new primary lesion.  Needs a biopsy for tissue diagnosis.  Cystic left upper lobe nodule-complex cystic nodule.  Has progressed very slowly.  There is also some radiation change in the left upper lobe.  We normally just follow that if we are going to do a biopsy of the right, we would also attempt to biopsy it at the same setting.  I recommended to Mr. and Mrs. Ester Helms that we proceed with robotic bronchoscopy for diagnostic purposes.  Plan would be to sample both the right upper lobe and left upper lobe areas.  I informed them of the general nature of the procedure including the need for general anesthesia.  They understand this is an endoscopic procedure with no incisions.  They understand is strictly diagnostic and not therapeutic.  I informed  him of the indications, risks, benefits, and alternatives.  They understand the risks include, but not limited to death, MI, DVT, PE, respiratory failure, pneumothorax, bleeding, as well as the possibility of other unforeseeable complications.  They understand there is no guarantee will get a tissue diagnosis.  He understands accepts the risk and agrees to proceed.  He does have a history of coronary disease and has drug-eluting stents.  He is on Plavix .  That will need to be held for 5 days prior to the procedure.  He is still having chest pain so 1 to make sure that no additional cardiology testing is needed prior to the procedure.  Will check with Dr. Katheryne Pane on that matter.  Plan: Cardiology clearance Robotic bronchoscopy for biopsy of right upper and left upper lobe nodules  Zelphia Higashi, MD [facility] [phone]

## 2024-03-10 NOTE — H&P (View-Only) (Signed)
 PCP is Roselind Congo, MD Referring Provider is Marlene Simas, MD  Chief Complaint  Patient presents with   Lung Lesion    Surgical consult, PET Scan 02/08/24/ Chest CT 01/25/24/ MR Brain sch'ed for 03/14/24    HPI: Francisco Austin is sent for consultation regarding a new right upper lobe lung nodule.  Francisco Austin is a 73 year old male with a history of tobacco abuse, limited stage small cell lung cancer, squamous cell carcinoma of the lung, COPD, hypertension, hyperlipidemia, coronary artery disease, and DES x 3.  Diagnosed with limited stage small cell carcinoma in 2023.  Also noted to have a small endobronchial squamous cell carcinoma at the time of bronchoscopy.  Treated with chemoradiation.  Recently had a follow-up CT scan which showed a new subsolid nodule in the right upper lobe.  On PET/CT the nodule is hypermetabolic with an SUV of 10.  Also little bit of activity associated with a cystic lesion in the left upper lobe with an SUV of 1.4.  No mediastinal or hilar adenopathy.  No change in appetite or weight loss.  He has been having chest pain.  He says the frequency has decreased since he had stents put in a couple years ago but can be severe at times.  He does have 3 stents and is on Plavix .  Has had some dizzy spells and recently had a fall.  Zubrod Score: At the time of surgery this patient's most appropriate activity status/level should be described as: []     0    Normal activity, no symptoms []     1    Restricted in physical strenuous activity but ambulatory, able to do out light work [x]     2    Ambulatory and capable of self care, unable to do work activities, up and about >50 % of waking hours                              []     3    Only limited self care, in bed greater than 50% of waking hours []     4    Completely disabled, no self care, confined to bed or chair []     5    Moribund  Past Medical History:  Diagnosis Date   Anxiety    Back pain    CAD (coronary artery  disease)    COPD (chronic obstructive pulmonary disease) (HCC)    History of kidney stones    HLD (hyperlipidemia)    HTN (hypertension)    Pre-diabetes     Past Surgical History:  Procedure Laterality Date   APPENDECTOMY     CORONARY STENT INTERVENTION N/A 12/24/2021   Procedure: CORONARY STENT INTERVENTION;  Surgeon: Lucendia Rusk, MD;  Location: [facility] INVASIVE CV LAB;  Service: Cardiovascular;  Laterality: N/A;   CORONARY ULTRASOUND/IVUS N/A 12/24/2021   Procedure: Intravascular Ultrasound/IVUS;  Surgeon: Lucendia Rusk, MD;  Location: [facility] INVASIVE CV LAB;  Service: Cardiovascular;  Laterality: N/A;   HEMORROIDECTOMY     LEFT HEART CATH AND CORONARY ANGIOGRAPHY N/A 12/23/2021   Procedure: LEFT HEART CATH AND CORONARY ANGIOGRAPHY;  Surgeon: Avanell Leigh, MD;  Location: [facility] INVASIVE CV LAB;  Service: Cardiovascular;  Laterality: N/A;   TONSILLECTOMY     VIDEO BRONCHOSCOPY WITH ENDOBRONCHIAL ULTRASOUND N/A 03/12/2022   Procedure: VIDEO BRONCHOSCOPY WITH ENDOBRONCHIAL ULTRASOUND;  Surgeon: Zelphia Higashi, MD;  Location: [facility] OR;  Service: Thoracic;  Laterality:  N/A;    Family History  Problem Relation Age of Onset   Heart failure Mother    Cancer Father     Social History Social History   Tobacco Use   Smoking status: Every Day    Types: Pipe   Smokeless tobacco: Never  Vaping Use   Vaping status: Never Used  Substance Use Topics   Alcohol use: Never   Drug use: Never    Current Outpatient Medications  Medication Sig Dispense Refill   albuterol  (VENTOLIN  HFA) 108 (90 Base) MCG/ACT inhaler Inhale 2 puffs into the lungs every 6 (six) hours as needed for wheezing or shortness of breath.     aspirin  EC 81 MG tablet Take 81 mg by mouth daily.     atorvastatin  (LIPITOR ) 80 MG tablet Take 1 tablet (80 mg total) by mouth daily. 90 tablet 1   Cholecalciferol 50 MCG (2000 UT) TABS Take 2,000 Units by mouth daily.     clopidogrel  (PLAVIX ) 75 MG tablet TAKE ONE  TABLET BY MOUTH ONCE DAILY WITH BREAKFAST 90 tablet 3   diclofenac sodium (VOLTAREN) 1 % GEL Apply 1 g topically daily as needed (pain).     diltiazem  (TIAZAC ) 240 MG 24 hr capsule Take 240 mg by mouth daily.     esomeprazole (NEXIUM) 40 MG capsule Take 40 mg by mouth daily.     HYDROcodone -acetaminophen  (NORCO) 10-325 MG tablet Take 1-2 tablets by mouth every 6 (six) hours as needed.     LORazepam  (ATIVAN ) 1 MG tablet Take 1 mg by mouth 3 (three) times daily.     nitroGLYCERIN  (NITROSTAT ) 0.4 MG SL tablet Place 0.4 mg under the tongue every 5 (five) minutes as needed for chest pain.     telmisartan (MICARDIS) 80 MG tablet Take 80 mg by mouth daily.     umeclidinium-vilanterol (ANORO ELLIPTA) 62.5-25 MCG/INH AEPB Inhale 1 puff into the lungs daily.     vitamin B-12 (CYANOCOBALAMIN) 500 MCG tablet Take 500 mcg by mouth daily.     guaiFENesin  (ROBITUSSIN) 100 MG/5ML liquid Take 10 mLs by mouth every 4 (four) hours as needed for cough or to loosen phlegm. 473 mL 5   prochlorperazine  (COMPAZINE ) 10 MG tablet TAKE ONE TABLET BY MOUTH EVERY 6 HOURS AS NEEDED 30 tablet 2   No current facility-administered medications for this visit.   Facility-Administered Medications Ordered in Other Visits  Medication Dose Route Frequency Provider Last Rate Last Admin   prochlorperazine  (COMPAZINE ) 10 MG/2ML injection             Allergies  Allergen Reactions   Gabapentin Anaphylaxis and Other (See Comments)    B/P dropped to "40"    Morphine Other (See Comments)    Burn his veins   Other Anaphylaxis    Androgenic Anabolic Steroid   Prednisone Anaphylaxis   Alprazolam Other (See Comments)    Other reaction(s): weirds him out   Fentanyl      Does not relieve pain for him    Niacin Other (See Comments)    Body feel weird   Penicillamine Hives    Review of Systems  Constitutional:  Positive for activity change and fatigue. Negative for unexpected weight change.  HENT:  Negative for trouble swallowing  and voice change.   Respiratory:  Positive for cough and shortness of breath.   Cardiovascular:  Positive for chest pain and leg swelling.  Musculoskeletal:  Positive for arthralgias, joint swelling and myalgias.       Chronic pain  Neurological:  Positive for dizziness, numbness and headaches.       Memory loss  Hematological:  Bruises/bleeds easily.  Psychiatric/Behavioral:  The patient is nervous/anxious.     BP 103/68   Pulse 79   Resp 20   Ht 5\' 10"  (1.778 m)   Wt 188 lb (85.3 kg)   SpO2 93% Comment: RA  BMI 26.98 kg/m  Physical Exam Vitals reviewed.  Constitutional:      General: He is not in acute distress.    Appearance: Normal appearance.  HENT:     Head: Normocephalic and atraumatic.  Eyes:     Extraocular Movements: Extraocular movements intact.  Cardiovascular:     Rate and Rhythm: Normal rate and regular rhythm.     Heart sounds: Normal heart sounds. No murmur heard.    No friction rub. No gallop.  Pulmonary:     Effort: Pulmonary effort is normal.     Breath sounds: No wheezing or rales.     Comments: Diminished breath sounds bilaterally Abdominal:     General: There is no distension.     Palpations: Abdomen is soft.  Musculoskeletal:     Cervical back: Neck supple.  Lymphadenopathy:     Cervical: No cervical adenopathy.  Skin:    General: Skin is warm and dry.     Comments: Multiple ecchymoses  Neurological:     General: No focal deficit present.     Mental Status: He is alert and oriented to person, place, and time.     Cranial Nerves: No cranial nerve deficit.     Motor: No weakness.    Diagnostic Tests: NUCLEAR MEDICINE PET SKULL BASE TO THIGH   TECHNIQUE: 9.9 mCi F-18 FDG was injected intravenously. Full-ring PET imaging was performed from the skull base to thigh after the radiotracer. CT data was obtained and used for attenuation correction and anatomic localization.   Fasting blood glucose: 95 mg/dl   COMPARISON:  13/06/6577    FINDINGS: Mediastinal blood pool activity: SUV max 2.4   Liver activity: SUV max NA   NECK: Symmetric and likely physiologic activity along the palatine tonsils. Symmetric glottic activity.   Incidental CT findings: Bilateral common carotid atheromatous vascular calcification.   CHEST: Part solid 1.4 by 1.1 cm right upper lobe nodule on image 67 series 4 has maximum SUV of 10.5, compatible with malignancy.   Cystic lung lesion in the left upper lobe on image 77 series 4 measuring about 1.9 by 1.5 cm has some minimal thickening of the margins and maximum SUV 1.4. Mild enlargement compared to 02/26/2022. Surveillance for low-grade adenocarcinoma suggested.   Previous left lower lobe infrahilar hypermetabolic nodule no longer visible.   Benign hypermetabolic activity along the lipomatous interatrial septum.   Incidental CT findings: Coronary, aortic arch, and branch vessel atherosclerotic vascular disease. Mitral valve calcification. Emphysema. Scarring in the left lung likely from prior radiation therapy.   ABDOMEN/PELVIS: No significant abnormal hypermetabolic activity in this region.   Incidental CT findings: Atherosclerosis is present, including aortoiliac atherosclerotic disease. Nonobstructive right nephrolithiasis. Prominent stool throughout the colon favors constipation.   SKELETON: No significant abnormal hypermetabolic activity in this region.   Incidental CT findings: None.   IMPRESSION: 1. Part solid 1.4 by 1.1 cm right upper lobe nodule has maximum SUV of 10.5, compatible with malignancy. 2. Cystic lung lesion in the left upper lobe measuring about 1.9 by 1.5 cm has some minimal thickening of the margins and maximum SUV 1.4. Mild enlargement compared to  02/26/2022. Surveillance for low-grade adenocarcinoma suggested. 3. Previous left lower lobe infrahilar hypermetabolic nodule no longer visible. 4. Aortic Atherosclerosis (ICD10-I70.0) and Emphysema  (ICD10-J43.9). 5. Coronary, aortic arch, and branch vessel atherosclerotic vascular disease. Mitral valve calcification. 6. Nonobstructive right nephrolithiasis. 7. Prominent stool throughout the colon favors constipation.     Electronically Signed   By: Freida Jes M.D.   On: 02/19/2024 12:37 I personally reviewed the CT and PET/CT images.  There is a new 1.7 x 1.5 cm mixed density nodule in the right upper lobe that is markedly hypermetabolic with an SUV of 10.5.  Some progression of a cystic central left upper lobe nodule with irregular wall thickness and mild uptake with SUV of 1.4.  Extensive aortic, great vessel, and coronary atherosclerosis.  Impression: Francisco Austin is a 73 year old male with a history of tobacco abuse, limited stage small cell lung cancer, squamous cell carcinoma of the lung, COPD, hypertension, hyperlipidemia, coronary artery disease, and DES x 3.  Now presents with a new right upper lobe nodule as well as some very slow progression of a cystic lesion in the left upper lobe.  Right upper lobe nodule-question whether this is a new primary or recurrence of his small cell.  Based on appearance is a good possibility this is a new primary lesion.  Needs a biopsy for tissue diagnosis.  Cystic left upper lobe nodule-complex cystic nodule.  Has progressed very slowly.  There is also some radiation change in the left upper lobe.  We normally just follow that if we are going to do a biopsy of the right, we would also attempt to biopsy it at the same setting.  I recommended to Mr. and Mrs. Francisco Austin that we proceed with robotic bronchoscopy for diagnostic purposes.  Plan would be to sample both the right upper lobe and left upper lobe areas.  I informed them of the general nature of the procedure including the need for general anesthesia.  They understand this is an endoscopic procedure with no incisions.  They understand is strictly diagnostic and not therapeutic.  I informed  him of the indications, risks, benefits, and alternatives.  They understand the risks include, but not limited to death, MI, DVT, PE, respiratory failure, pneumothorax, bleeding, as well as the possibility of other unforeseeable complications.  They understand there is no guarantee will get a tissue diagnosis.  He understands accepts the risk and agrees to proceed.  He does have a history of coronary disease and has drug-eluting stents.  He is on Plavix .  That will need to be held for 5 days prior to the procedure.  He is still having chest pain so 1 to make sure that no additional cardiology testing is needed prior to the procedure.  Will check with Dr. Katheryne Pane on that matter.  Plan: Cardiology clearance Robotic bronchoscopy for biopsy of right upper and left upper lobe nodules  Zelphia Higashi, MD [facility] [phone]

## 2024-03-14 ENCOUNTER — Ambulatory Visit (HOSPITAL_COMMUNITY)
Admission: RE | Admit: 2024-03-14 | Discharge: 2024-03-14 | Disposition: A | Discharge status: Home / Self Care | Source: Ambulatory Visit | Attending: Internal Medicine | Admitting: Internal Medicine

## 2024-03-14 ENCOUNTER — Telehealth: Payer: Self-pay

## 2024-03-14 DIAGNOSIS — C3490 Malignant neoplasm of unspecified part of unspecified bronchus or lung: Secondary | ICD-10-CM | POA: Diagnosis not present

## 2024-03-14 DIAGNOSIS — Z01818 Encounter for other preprocedural examination: Secondary | ICD-10-CM

## 2024-03-14 DIAGNOSIS — I25119 Atherosclerotic heart disease of native coronary artery with unspecified angina pectoris: Secondary | ICD-10-CM

## 2024-03-14 MED ORDER — GADOBUTROL 1 MMOL/ML IV SOLN
8.5000 mL | Freq: Once | INTRAVENOUS | Status: AC | PRN
  Administered 2024-03-14: 8.5 mL via INTRAVENOUS

## 2024-03-14 NOTE — Telephone Encounter (Signed)
 Spoke with pt regarding needing a lexiscan for pre-operative clearance before Dr. Luna Salinas does a lung biopsy. Pt verbalizes understanding.

## 2024-03-14 NOTE — Telephone Encounter (Signed)
-----   Message from Lauro Portal sent at 03/10/2024 12:24 PM EDT ----- Regarding: Lexi. Damain Broadus, can you please order a Lexiscan on Francisco Austin for pre op surgical clearance. If low risk, he can hold his plavix  for 5 days prior to the procedure.  JJB

## 2024-03-16 ENCOUNTER — Encounter (HOSPITAL_COMMUNITY): Payer: Self-pay | Admitting: Cardiovascular Disease

## 2024-03-17 ENCOUNTER — Telehealth (HOSPITAL_COMMUNITY): Payer: Self-pay | Admitting: *Deleted

## 2024-03-17 ENCOUNTER — Encounter: Payer: Self-pay | Admitting: *Deleted

## 2024-03-17 ENCOUNTER — Other Ambulatory Visit: Payer: Self-pay | Admitting: *Deleted

## 2024-03-17 DIAGNOSIS — R918 Other nonspecific abnormal finding of lung field: Secondary | ICD-10-CM

## 2024-03-17 NOTE — Telephone Encounter (Signed)

## 2024-03-18 NOTE — Telephone Encounter (Signed)
 Detailed message left for pt to let him know that stress test is cancelled. Advised pt to hold plavix  for 5 days prior to bronch. Phone number left for pt to call back with questions or concerns.

## 2024-03-23 ENCOUNTER — Telehealth (HOSPITAL_COMMUNITY): Payer: Self-pay | Admitting: *Deleted

## 2024-03-23 ENCOUNTER — Inpatient Hospital Stay (HOSPITAL_COMMUNITY): Admission: RE | Admit: 2024-03-23 | Source: Ambulatory Visit

## 2024-03-23 ENCOUNTER — Other Ambulatory Visit: Payer: Self-pay | Admitting: Cardiovascular Disease

## 2024-03-23 DIAGNOSIS — I25119 Atherosclerotic heart disease of native coronary artery with unspecified angina pectoris: Secondary | ICD-10-CM

## 2024-03-23 DIAGNOSIS — Z01818 Encounter for other preprocedural examination: Secondary | ICD-10-CM

## 2024-03-23 NOTE — Telephone Encounter (Signed)
 Spoke to patient and he was given detailed instructions about his STRESS TEST on 03/25/24 at 10:15.

## 2024-03-25 ENCOUNTER — Ambulatory Visit (HOSPITAL_COMMUNITY)
Admission: RE | Admit: 2024-03-25 | Discharge: 2024-03-25 | Disposition: A | Discharge status: Home / Self Care | Source: Ambulatory Visit | Attending: Cardiovascular Disease | Admitting: Cardiovascular Disease

## 2024-03-25 DIAGNOSIS — Z01818 Encounter for other preprocedural examination: Secondary | ICD-10-CM | POA: Insufficient documentation

## 2024-03-25 DIAGNOSIS — Z01810 Encounter for preprocedural cardiovascular examination: Secondary | ICD-10-CM | POA: Diagnosis not present

## 2024-03-25 DIAGNOSIS — I25119 Atherosclerotic heart disease of native coronary artery with unspecified angina pectoris: Secondary | ICD-10-CM | POA: Insufficient documentation

## 2024-03-25 DIAGNOSIS — Z955 Presence of coronary angioplasty implant and graft: Secondary | ICD-10-CM | POA: Diagnosis not present

## 2024-03-25 LAB — MYOCARDIAL PERFUSION IMAGING
LV dias vol: 102 mL (ref 62–150)
LV sys vol: 27 mL
Nuc Stress EF: 74 %
Peak HR: 86 {beats}/min
Rest HR: 70 {beats}/min
Rest Nuclear Isotope Dose: 10.8 mCi
SDS: 0
SRS: 1
SSS: 0
ST Depression (mm): 0 mm
Stress Nuclear Isotope Dose: 32.2 mCi
TID: 0.95

## 2024-03-25 MED ORDER — REGADENOSON 0.4 MG/5ML IV SOLN
INTRAVENOUS | Status: AC
  Filled 2024-03-25: qty 5

## 2024-03-25 MED ORDER — REGADENOSON 0.4 MG/5ML IV SOLN
0.4000 mg | Freq: Once | INTRAVENOUS | Status: AC
  Administered 2024-03-25: 0.4 mg via INTRAVENOUS

## 2024-03-25 MED ORDER — TECHNETIUM TC 99M TETROFOSMIN IV KIT
10.8000 | PACK | Freq: Once | INTRAVENOUS | Status: AC | PRN
  Administered 2024-03-25: 10.8 via INTRAVENOUS

## 2024-03-25 MED ORDER — TECHNETIUM TC 99M TETROFOSMIN IV KIT
32.2000 | PACK | Freq: Once | INTRAVENOUS | Status: AC | PRN
  Administered 2024-03-25: 32.2 via INTRAVENOUS

## 2024-03-25 NOTE — Progress Notes (Signed)
 Surgical Instructions   Your procedure is scheduled on Thursday Mar 31, 2024. Report to [facility] Main Entrance "A" at 6:00 A.M., then check in with the Admitting office. Any questions or running late day of surgery: call [phone]  Questions prior to your surgery date: call [phone], Monday-Friday, 8am-4pm. If you experience any cold or flu symptoms such as cough, fever, chills, shortness of breath, etc. between now and your scheduled surgery, please notify us  at the above number.     Remember:  Do not eat or drink after midnight the night before your surgery   Take these medicines the morning of surgery with A SIP OF WATER  atorvastatin  (LIPITOR )  diltiazem  (TIAZAC )  esomeprazole (NEXIUM)  LORazepam  (ATIVAN )  umeclidinium-vilanterol (ANORO ELLIPTA)   May take these medicines IF NEEDED: albuterol  (VENTOLIN  HFA) 108 (90 Base) MCG/ACT inhaler Please bring with you to the hospital HYDROcodone -acetaminophen  (NORCO)   nitroGLYCERIN  (NITROSTAT ) If you have to take this medication prior to surgery, please call [phone] and report this to a nurse prochlorperazine  (COMPAZINE )    PER DR. HENDRICKSON'S OFFICE, PLEASE HOLD YOUR clopidogrel  (PLAVIX )  FOR 5 DAYS PRIOR TO SURGERY WITH THE LAST DOSE BEING 03/25/2024.   PER DR. HENDRICKSON'S OFFICE, PLEASE HOLD YOUR ASPIRIN  THE DAY OF SURGERY.    One week prior to surgery, STOP taking any Aleve, Naproxen, Ibuprofen, Motrin, Advil, Goody's, BC's, all herbal medications, fish oil, and non-prescription vitamins. This includes your diclofenac sodium (VOLTAREN) 1 % GEL.                        Do NOT Smoke (Tobacco/Vaping) for 24 hours prior to your procedure.  If you use a CPAP at night, you may bring your mask/headgear for your overnight stay.   You will be asked to remove any contacts, glasses, piercing's, hearing aid's, dentures/partials prior to surgery. Please bring cases for these items if needed.    Patients discharged the day of  surgery will not be allowed to drive home, and someone needs to stay with them for 24 hours.  SURGICAL WAITING ROOM VISITATION Patients may have no more than 2 support people in the waiting area - these visitors may rotate.   Pre-op nurse will coordinate an appropriate time for 1 ADULT support person, who may not rotate, to accompany patient in pre-op.  Children under the age of 55 must have an adult with them who is not the patient and must remain in the main waiting area with an adult.  If the patient needs to stay at the hospital during part of their recovery, the visitor guidelines for inpatient rooms apply.  Please refer to the [facility] website for the visitor guidelines for any additional information.   If you received a COVID test during your pre-op visit  it is requested that you wear a mask when out in public, stay away from anyone that may not be feeling well and notify your surgeon if you develop symptoms. If you have been in contact with anyone that has tested positive in the last 10 days please notify you surgeon.      Pre-operative CHG Bathing Instructions   You can play a key role in reducing the risk of infection after surgery. Your skin needs to be as free of germs as possible. You can reduce the number of germs on your skin by washing with CHG (chlorhexidine  gluconate) soap before surgery. CHG is an antiseptic soap that kills germs and continues to  kill germs even after washing.   DO NOT use if you have an allergy to chlorhexidine /CHG or antibacterial soaps. If your skin becomes reddened or irritated, stop using the CHG and notify one of our RNs at [phone].              TAKE A SHOWER THE NIGHT BEFORE SURGERY AND THE DAY OF SURGERY    Please keep in mind the following:  DO NOT shave, including legs and underarms, 48 hours prior to surgery.   You may shave your face before/day of surgery.  Place clean sheets on your bed the night before surgery Use a clean  washcloth (not used since being washed) for each shower. DO NOT sleep with pet's night before surgery.  CHG Shower Instructions:  Wash your face and private area with normal soap. If you choose to wash your hair, wash first with your normal shampoo.  After you use shampoo/soap, rinse your hair and body thoroughly to remove shampoo/soap residue.  Turn the water OFF and apply half the bottle of CHG soap to a CLEAN washcloth.  Apply CHG soap ONLY FROM YOUR NECK DOWN TO YOUR TOES (washing for 3-5 minutes)  DO NOT use CHG soap on face, private areas, open wounds, or sores.  Pay special attention to the area where your surgery is being performed.  If you are having back surgery, having someone wash your back for you may be helpful. Wait 2 minutes after CHG soap is applied, then you may rinse off the CHG soap.  Pat dry with a clean towel  Put on clean pajamas    Additional instructions for the day of surgery: DO NOT APPLY any lotions, deodorants or cologne.   Do not wear jewelry Do not bring valuables to the hospital. [facility] is not responsible for valuables/personal belongings. Put on clean/comfortable clothes.  Please brush your teeth.  Ask your nurse before applying any prescription medications to the skin.

## 2024-03-26 ENCOUNTER — Ambulatory Visit: Payer: Self-pay | Admitting: Cardiology

## 2024-03-26 NOTE — Progress Notes (Signed)
 Normal stress test, low risk overall.

## 2024-03-29 ENCOUNTER — Ambulatory Visit (HOSPITAL_COMMUNITY)

## 2024-03-29 ENCOUNTER — Other Ambulatory Visit: Payer: Self-pay

## 2024-03-29 ENCOUNTER — Encounter (HOSPITAL_COMMUNITY): Payer: Self-pay

## 2024-03-29 ENCOUNTER — Ambulatory Visit (HOSPITAL_COMMUNITY)
Admission: RE | Admit: 2024-03-29 | Discharge: 2024-03-29 | Disposition: A | Discharge status: Home / Self Care | Source: Ambulatory Visit | Attending: Thoracic Surgery (Cardiothoracic Vascular Surgery) | Admitting: Thoracic Surgery (Cardiothoracic Vascular Surgery)

## 2024-03-29 ENCOUNTER — Encounter (HOSPITAL_COMMUNITY)
Admission: RE | Admit: 2024-03-29 | Discharge: 2024-03-29 | Disposition: A | Discharge status: Home / Self Care | Source: Ambulatory Visit | Attending: Thoracic Surgery (Cardiothoracic Vascular Surgery) | Admitting: Thoracic Surgery (Cardiothoracic Vascular Surgery)

## 2024-03-29 DIAGNOSIS — Z923 Personal history of irradiation: Secondary | ICD-10-CM | POA: Diagnosis not present

## 2024-03-29 DIAGNOSIS — Z9221 Personal history of antineoplastic chemotherapy: Secondary | ICD-10-CM | POA: Insufficient documentation

## 2024-03-29 DIAGNOSIS — R911 Solitary pulmonary nodule: Secondary | ICD-10-CM | POA: Diagnosis not present

## 2024-03-29 DIAGNOSIS — Z87891 Personal history of nicotine dependence: Secondary | ICD-10-CM | POA: Diagnosis not present

## 2024-03-29 DIAGNOSIS — E785 Hyperlipidemia, unspecified: Secondary | ICD-10-CM | POA: Diagnosis not present

## 2024-03-29 DIAGNOSIS — I10 Essential (primary) hypertension: Secondary | ICD-10-CM | POA: Insufficient documentation

## 2024-03-29 DIAGNOSIS — J439 Emphysema, unspecified: Secondary | ICD-10-CM | POA: Diagnosis not present

## 2024-03-29 DIAGNOSIS — Z85118 Personal history of other malignant neoplasm of bronchus and lung: Secondary | ICD-10-CM | POA: Diagnosis not present

## 2024-03-29 DIAGNOSIS — R918 Other nonspecific abnormal finding of lung field: Secondary | ICD-10-CM | POA: Insufficient documentation

## 2024-03-29 DIAGNOSIS — Z955 Presence of coronary angioplasty implant and graft: Secondary | ICD-10-CM | POA: Insufficient documentation

## 2024-03-29 DIAGNOSIS — R7303 Prediabetes: Secondary | ICD-10-CM | POA: Insufficient documentation

## 2024-03-29 DIAGNOSIS — I2510 Atherosclerotic heart disease of native coronary artery without angina pectoris: Secondary | ICD-10-CM | POA: Insufficient documentation

## 2024-03-29 DIAGNOSIS — Z01818 Encounter for other preprocedural examination: Secondary | ICD-10-CM | POA: Insufficient documentation

## 2024-03-29 HISTORY — DX: Localized enlarged lymph nodes: R59.0

## 2024-03-29 HISTORY — DX: Contact with and (suspected) exposure to other hazardous, chiefly nonmedicinal, chemicals: Z77.098

## 2024-03-29 HISTORY — DX: Malignant neoplasm of unspecified part of unspecified bronchus or lung: C34.90

## 2024-03-29 HISTORY — DX: Other chronic pain: G89.29

## 2024-03-29 HISTORY — DX: Malignant (primary) neoplasm, unspecified: C80.1

## 2024-03-29 HISTORY — DX: Headache, unspecified: R51.9

## 2024-03-29 HISTORY — DX: Unspecified osteoarthritis, unspecified site: M19.90

## 2024-03-29 HISTORY — DX: Angina pectoris with documented spasm: I20.1

## 2024-03-29 LAB — CBC
HCT: 40.4 % (ref 39.0–52.0)
Hemoglobin: 13.3 g/dL (ref 13.0–17.0)
MCH: 32.2 pg (ref 26.0–34.0)
MCHC: 32.9 g/dL (ref 30.0–36.0)
MCV: 97.8 fL (ref 80.0–100.0)
Platelets: 301 10*3/uL (ref 150–400)
RBC: 4.13 MIL/uL — ABNORMAL LOW (ref 4.22–5.81)
RDW: 14.6 % (ref 11.5–15.5)
WBC: 5.1 10*3/uL (ref 4.0–10.5)
nRBC: 0 % (ref 0.0–0.2)

## 2024-03-29 LAB — COMPREHENSIVE METABOLIC PANEL WITH GFR
ALT: 15 U/L (ref 0–44)
AST: 18 U/L (ref 15–41)
Albumin: 3.9 g/dL (ref 3.5–5.0)
Alkaline Phosphatase: 70 U/L (ref 38–126)
Anion gap: 9 (ref 5–15)
BUN: 6 mg/dL — ABNORMAL LOW (ref 8–23)
CO2: 24 mmol/L (ref 22–32)
Calcium: 9.1 mg/dL (ref 8.9–10.3)
Chloride: 105 mmol/L (ref 98–111)
Creatinine, Ser: 0.83 mg/dL (ref 0.61–1.24)
GFR, Estimated: >60 mL/min (ref >60)
Glucose, Bld: 133 mg/dL — ABNORMAL HIGH (ref 70–99)
Potassium: 3.9 mmol/L (ref 3.5–5.1)
Sodium: 138 mmol/L (ref 135–145)
Total Bilirubin: 0.7 mg/dL (ref 0.0–1.2)
Total Protein: 6.9 g/dL (ref 6.5–8.1)

## 2024-03-29 LAB — APTT: aPTT: 31 s (ref 24–36)

## 2024-03-29 LAB — PROTIME-INR
INR: 1 (ref 0.8–1.2)
Prothrombin Time: 13.3 s (ref 11.4–15.2)

## 2024-03-29 NOTE — Progress Notes (Signed)
 PCP - Dr. Elyn Han Cardiologist - Dr. Lauro Portal Oncologist- Dr. Marlene Simas  PPM/ICD - denies   Chest x-ray - 03/29/24 EKG - 02/08/24 Stress Test - 03/25/24 ECHO - 12/24/21 Cardiac Cath - 12/23/21  Sleep Study - denies   DM- pre-diabetic  Last dose of GLP1 agonist-  n/a   Blood Thinner Instructions: Hold Plavix  5 days. Last dose 5/23 Aspirin  Instructions: Hold DOS  ERAS Protcol - no, NPO   COVID TEST- n/a   Anesthesia review: yes, cardiac hx.  Patient denies shortness of breath, fever, cough and chest pain at PAT appointment   All instructions explained to the patient, with a verbal understanding of the material. Patient agrees to go over the instructions while at home for a better understanding. The opportunity to ask questions was provided.

## 2024-03-29 NOTE — Anesthesia Preprocedure Evaluation (Signed)
 Anesthesia Evaluation  Patient identified by MRN, date of birth, ID band Patient awake    Reviewed: Allergy & Precautions, H&P , NPO status , Patient's Chart, lab work & pertinent test results  Airway Mallampati: II  TM Distance: >3 FB Neck ROM: Full    Dental no notable dental hx. (+) Upper Dentures, Partial Lower, Dental Advisory Given   Pulmonary COPD, Current Smoker and Patient abstained from smoking.   Pulmonary exam normal breath sounds clear to auscultation       Cardiovascular Exercise Tolerance: Good hypertension, Pt. on medications + CAD and + Cardiac Stents   Rhythm:Regular Rate:Normal  Nuclear stress test 03/25/24:   The study is normal. The study is low risk.   No ST deviation was noted.   LV perfusion is normal.   Left ventricular function is normal. Nuclear stress EF: 74%. The left ventricular ejection fraction is hyperdynamic (>65%). End diastolic cavity size is normal. End systolic cavity size is normal.   CT images were obtained for attenuation correction and were examined for the presence of coronary calcium  when appropriate.   Coronary calcium  assessment not performed due to prior revascularization.   Prior study not available for comparison.   Electronically signed by Luana Rumple, MD   Low risk stress nuclear study with normal perfusion and normal left ventricular regional and global systolic function.    Neuro/Psych  Headaches  Anxiety        GI/Hepatic Neg liver ROS,GERD  Medicated,,  Endo/Other  negative endocrine ROSdiabetes, Well Controlled    Renal/GU negative Renal ROS  negative genitourinary   Musculoskeletal  (+) Arthritis , Osteoarthritis,    Abdominal   Peds  Hematology negative hematology ROS (+)   Anesthesia Other Findings   Reproductive/Obstetrics negative OB ROS                             Anesthesia Physical Anesthesia Plan  ASA:  3  Anesthesia Plan: General   Post-op Pain Management: Tylenol  PO (pre-op)*   Induction: Intravenous  PONV Risk Score and Plan: 2 and Ondansetron , Dexamethasone, Propofol  infusion and TIVA  Airway Management Planned: Oral ETT  Additional Equipment:   Intra-op Plan:   Post-operative Plan: Extubation in OR  Informed Consent: I have reviewed the patients History and Physical, chart, labs and discussed the procedure including the risks, benefits and alternatives for the proposed anesthesia with the patient or authorized representative who has indicated his/her understanding and acceptance.     Dental advisory given  Plan Discussed with: CRNA  Anesthesia Plan Comments: (PAT note written 03/29/2024 by Ridge Lafond, PA-C.  )       Anesthesia Quick Evaluation

## 2024-03-29 NOTE — Progress Notes (Signed)
 Anesthesia Chart Review:  Case: [id number] Date/Time: 03/31/24 0800   Procedure: BRONCHOSCOPY, WITH BIOPSY USING ELECTROMAGNETIC NAVIGATION - robotic bronchoscopy for biopsy of right upper and left upper lobe nodules   Anesthesia type: General   Diagnosis: Lung nodules [R91.8]   Pre-op diagnosis: RIGHT AND LEFT UPPER LOBE NODULES   [facility] / [facility]   Surgeons: Zelphia Higashi, MD       DISCUSSION: Patient is a 73 year old male scheduled for the above procedure. He was diagnosed with limited stage small cell lung cancer(LLL, 11L node) in May 2023 and underwent systemic chemotherapy with cisplatin  and etoposide , concurrent radiotherapy, and prophylactic cranial irradiation. He has been on observation since August 2023. Recent chest imaging showed a suspicoius RUL nodule as well as some slow progression in the LUL. Above procedure recommended.   History includes smoking, HTN, HLD, CAD ( s/p DES pLAD & atherectomy with overlapping DES RCA 12/24/21, chronic chest pain, ?vasospasm ), COPD, lung cancer (LLL  NSCLC, squamous cell carcinoma, +11L node 03/12/22 bronchoscopy/EBUS; s/p chemoradiation & prophylactic cranial irradiation), pre-diabetes, agent orange exposure, cluster headaches, anxiety.   Last cardiology evaluation was on 02/08/24 with Goodrich, Callie, PA-C. S/p DES stents to the pLAd and RCA 12/24/21. She noted, "Patient continue to have chronic chest pain that he has had since the 1990s and states feels different than the symptoms he was having prior to his PCI in 2023. Pain improves with sublingual Nitro and Ativan . Symptoms unchanged from baseline." He was continued on ASA, Plavix , and statin therapy. He was not interested in quitting smoking on his pipe. Stress test considered, but he initially deferred until Dr. Luna Salinas reached out to Dr. Katheryne Pane about plans for bronchoscopy under general anesthesia.  He subsequently underwent a nuclear stress test on  03/25/24 that was normal/low risk, EF 74%. Dr. Katheryne Pane gave permission to hold Plavix  for 5 days prior to surgery.  Last dose 03/25/24.   Dr. Luna Salinas classified his Zubrod Score as 2: Ambulatory and capable of self care, unable to do work activities, up and about >50 % of waking hours.  Preoperative labs noted. 03/29/24 CXR is still in process. Anesthesia team to evaluate on the day of surgery.   VS: BP 104/63   Pulse 63   Temp 36.5 C   Resp 18   Ht 5\' 10"  (1.778 m)   Wt 85.9 kg   SpO2 96%   BMI 27.18 kg/m    PROVIDERS: Roselind Congo, MD is PCP  Lauro Portal, MD is cardiologist  Marlene Simas, MD is HEM-ONC Travis Friedman, MD is RAD-ONC   LABS: Labs reviewed: Acceptable for surgery. (all labs ordered are listed, but only abnormal results are displayed)  Labs Reviewed  COMPREHENSIVE METABOLIC PANEL WITH GFR - Abnormal; Notable for the following components:      Result Value   Glucose, Bld 133 (*)    BUN 6 (*)    All other components within normal limits  CBC - Abnormal; Notable for the following components:   RBC 4.13 (*)    All other components within normal limits  PROTIME-INR  APTT     IMAGES: CXR 03/29/24: In process.  MRI Brain 03/14/24: IMPRESSION: - No evidence for active intracranial metastases.  - New extensive white matter changes which could be related to postradiation changes. Correlate clinically.  PET Scan 02/08/24: IMPRESSION: 1. Part solid 1.4 by 1.1 cm right upper lobe nodule has maximum SUV of 10.5, compatible with malignancy.  2. Cystic lung lesion in the left upper lobe measuring about 1.9 by 1.5 cm has some minimal thickening of the margins and maximum SUV 1.4. Mild enlargement compared to 02/26/2022. Surveillance for low-grade adenocarcinoma suggested. 3. Previous left lower lobe infrahilar hypermetabolic nodule no longer visible. 4. Aortic Atherosclerosis (ICD10-I70.0) and Emphysema (ICD10-J43.9). 5. Coronary, aortic arch, and  branch vessel atherosclerotic vascular disease. Mitral valve calcification. 6. Nonobstructive right nephrolithiasis. 7. Prominent stool throughout the colon favors constipation.   CT Chest 01/25/24: IMPRESSION: 1. There is an irregular 1.4 x 1.5 cm solid noncalcified nodule in the right lung upper lobe which is slowly increasing in size and density when compared to the prior exams. Further evaluation with PET-CT scan versus short-term follow-up/tissue sampling is recommended. Otherwise, no new mass or consolidation. No new lymphadenopathy. 2. Multiple other nonacute observations, as described above. - Aortic Atherosclerosis (ICD10-I70.0) and Emphysema (ICD10-J43.9).   MRI C-spine 11/08/23: IMPRESSION: 1. At C3-4 there is a mild disc osteophyte complex. Mild bilateral facet arthropathy. Bilateral uncovertebral degenerative changes. Moderate-severe bilateral foraminal stenosis. 2. At C4-5 there is a mild disc osteophyte complex. Bilateral uncovertebral degenerative changes. Moderate-severe right foraminal stenosis. Mild left foraminal stenosis. 3. At C5-6 there is a mild disc osteophyte complex. Bilateral uncovertebral degenerative changes. Severe right and moderate-severe left foraminal stenosis. 4. At C6-7 there is a mild disc bulge. Moderate left and mild right foraminal stenosis. 5. No acute osseous injury of the cervical spine.     EKG: 02/08/23: Normal sinus rhythm Non-specific ST changes in inferior leads and leads V5-V6 No significant changes compared to prior tracings Confirmed by Goodrich, Callie [phone]) on 02/08/2024 1:59:40 PM   CV: Nuclear stress test 03/25/24:   The study is normal. The study is low risk.   No ST deviation was noted.   LV perfusion is normal.   Left ventricular function is normal. Nuclear stress EF: 74%. The left ventricular ejection fraction is hyperdynamic (>65%). End diastolic cavity size is normal. End systolic cavity size is normal.   CT images  were obtained for attenuation correction and were examined for the presence of coronary calcium  when appropriate.   Coronary calcium  assessment not performed due to prior revascularization.   Prior study not available for comparison.   Electronically signed by Luana Rumple, MD   Low risk stress nuclear study with normal perfusion and normal left ventricular regional and global systolic function.   Limited Echo 12/24/21: Conclusion(s)/Recommendation(s): Limited periprocedural study shows no evidence of pericardial effusion.      LHC 12/23/21 (Dr. Katheryne Pane):   Prox LAD lesion is 90% stenosed.   Prox RCA to Mid RCA lesion is 80% stenosed.   The left ventricular systolic function is normal.   LV end diastolic pressure is normal.   The left ventricular ejection fraction is 55-65% by visual estimate. IMPRESSION:  Francisco Austin has two-vessel disease with a 90% fairly focal proximal LAD stenosis at the takeoff of the first diagonal branch which I think can be fairly easily fixed with balloon and stenting.  He also has a more complex proximal to mid RCA which is highly calcified and probably should best be treated with orbital atherectomy followed by PCI drug-eluting stenting.  His circumflex has minimal disease.  His LV function is normal.  My TIG catheter dipped into his LV and most likely initiated a fast tachycardia which resolved when I withdrew the catheter.  The patient's baseline EKG changed from narrow complex to appears to be a bundle branch block.  He did develop chest pain with this and relative hypotension with systolic blood pressure in the 90s.  Based on this, I elected not to proceed with intervention but rather will watch him overnight and arrange for Dr. Jacquelynn Matter to revascularize tomorrow.  I did load him with 600 mg of Plavix  and placed him on aspirin  and clopidogrel  in anticipation of his procedure.    PCI 12/24/21 (Dr. Jacquelynn Matter):  Prox LAD lesion is 90% stenosed.   A drug-eluting stent  was successfully placed using a STENT ONYX FRONTIER 3.5X12 and optimized with intravascular ultrasound.   Post intervention, there is a 0% residual stenosis.   Prox RCA to Mid RCA lesion is 80% stenosed.   Mid RCA lesion is 75% stenosed.  The RCA disease was treated with orbital atherectomy in the proximal to mid vessel   A drug-eluting stent was successfully placed using a SYNERGY XD 4.0X38 distally.   A drug-eluting stent was successfully placed using a SYNERGY XD 3.50X24 overlapping proximally.  There appeared to be some dye behind the stent.  This was confirmed by intravascular ultrasound.  Echocardiogram did not show any perforation.   Post intervention, there is a 0% residual stenosis.   Post intervention, there is a 0% residual stenosis.   Complex intervention of the RCA. Successful PCI of the LAD.  He will need dual antiplatelet therapy for at least 6 months.  I would strongly consider lifelong clopidogrel  monotherapy given the calcific disease he has in his right coronary artery.  Continue aggressive secondary prevention.     Echo 04/06/17: Conclusion: 1.  Mild concentric LVH with normal global wall motion.  Estimated EF 60%. 2.  Mild (grade 1) mitral regurgitation.  Mild calcification of the mitral valve annulus. 3.  Mild tricuspid regurgitation. 4.  IVC is dilated with respiratory variation.   Past Medical History:  Diagnosis Date   Agent orange exposure    Anxiety    Arthritis    Back pain    CAD (coronary artery disease)    Chronic chest pain    COPD (chronic obstructive pulmonary disease) (HCC)    Coronary vasospasm (HCC)    Headache    cluster   History of kidney stones    HLD (hyperlipidemia)    HLD (hyperlipidemia)    HTN (hypertension)    Lymphadenopathy, hilar    left   Mediastinal lymphadenopathy    Pre-diabetes    Small cell carcinoma (HCC)    Squamous cell carcinoma of lung (HCC)     Past Surgical History:  Procedure Laterality Date   APPENDECTOMY      CARDIAC CATHETERIZATION     multiple with Dr. Anastasia Balo (no stent placed at that time)   CORONARY STENT INTERVENTION N/A 12/24/2021   Procedure: CORONARY STENT INTERVENTION;  Surgeon: Lucendia Rusk, MD;  Location: [facility] INVASIVE CV LAB;  Service: Cardiovascular;  Laterality: N/A;   CORONARY ULTRASOUND/IVUS N/A 12/24/2021   Procedure: Intravascular Ultrasound/IVUS;  Surgeon: Lucendia Rusk, MD;  Location: [facility] INVASIVE CV LAB;  Service: Cardiovascular;  Laterality: N/A;   HEMORROIDECTOMY     LEFT HEART CATH AND CORONARY ANGIOGRAPHY N/A 12/23/2021   Procedure: LEFT HEART CATH AND CORONARY ANGIOGRAPHY;  Surgeon: Avanell Leigh, MD;  Location: [facility] INVASIVE CV LAB;  Service: Cardiovascular;  Laterality: N/A;   TONSILLECTOMY     VIDEO BRONCHOSCOPY WITH ENDOBRONCHIAL ULTRASOUND N/A 03/12/2022   Procedure: VIDEO BRONCHOSCOPY WITH ENDOBRONCHIAL ULTRASOUND;  Surgeon: Zelphia Higashi, MD;  Location: [facility] OR;  Service: Thoracic;  Laterality: N/A;    MEDICATIONS:  albuterol  (VENTOLIN  HFA) 108 (90 Base) MCG/ACT inhaler   aspirin  EC 81 MG tablet   atorvastatin  (LIPITOR ) 80 MG tablet   Cholecalciferol 50 MCG (2000 UT) TABS   clopidogrel  (PLAVIX ) 75 MG tablet   diclofenac sodium (VOLTAREN) 1 % GEL   diltiazem  (TIAZAC ) 240 MG 24 hr capsule   esomeprazole (NEXIUM) 40 MG capsule   HYDROcodone -acetaminophen  (NORCO) 10-325 MG tablet   LORazepam  (ATIVAN ) 1 MG tablet   nitroGLYCERIN  (NITROSTAT ) 0.4 MG SL tablet   telmisartan (MICARDIS) 80 MG tablet   umeclidinium-vilanterol (ANORO ELLIPTA) 62.5-25 MCG/INH AEPB   vitamin B-12 (CYANOCOBALAMIN) 500 MCG tablet   No current facility-administered medications for this encounter.    prochlorperazine  (COMPAZINE ) 10 MG/2ML injection    Francisco Gun, PA-C Surgical Short Stay/Anesthesiology [facility] Phone [phone] [facility] Phone [phone] 03/29/2024 4:52 PM

## 2024-03-31 ENCOUNTER — Encounter (HOSPITAL_COMMUNITY): Payer: Self-pay | Admitting: Thoracic Surgery (Cardiothoracic Vascular Surgery)

## 2024-03-31 ENCOUNTER — Ambulatory Visit (HOSPITAL_COMMUNITY)

## 2024-03-31 ENCOUNTER — Ambulatory Visit (HOSPITAL_COMMUNITY)
Admission: RE | Admit: 2024-03-31 | Discharge: 2024-03-31 | Disposition: A | Discharge status: Home / Self Care | Attending: Thoracic Surgery (Cardiothoracic Vascular Surgery) | Admitting: Thoracic Surgery (Cardiothoracic Vascular Surgery)

## 2024-03-31 ENCOUNTER — Ambulatory Visit (HOSPITAL_COMMUNITY): Payer: Self-pay | Admitting: Anesthesiology

## 2024-03-31 ENCOUNTER — Other Ambulatory Visit: Payer: Self-pay

## 2024-03-31 ENCOUNTER — Encounter (HOSPITAL_BASED_OUTPATIENT_CLINIC_OR_DEPARTMENT_OTHER)
Admission: RE | Disposition: A | Discharge status: Home / Self Care | Payer: Self-pay | Source: Home / Self Care | Attending: Thoracic Surgery (Cardiothoracic Vascular Surgery)

## 2024-03-31 ENCOUNTER — Ambulatory Visit (HOSPITAL_COMMUNITY): Payer: Self-pay | Admitting: Vascular Surgery

## 2024-03-31 DIAGNOSIS — I10 Essential (primary) hypertension: Secondary | ICD-10-CM | POA: Diagnosis not present

## 2024-03-31 DIAGNOSIS — Z7902 Long term (current) use of antithrombotics/antiplatelets: Secondary | ICD-10-CM | POA: Diagnosis not present

## 2024-03-31 DIAGNOSIS — Z85118 Personal history of other malignant neoplasm of bronchus and lung: Secondary | ICD-10-CM | POA: Diagnosis not present

## 2024-03-31 DIAGNOSIS — R911 Solitary pulmonary nodule: Secondary | ICD-10-CM | POA: Diagnosis not present

## 2024-03-31 DIAGNOSIS — K219 Gastro-esophageal reflux disease without esophagitis: Secondary | ICD-10-CM | POA: Diagnosis not present

## 2024-03-31 DIAGNOSIS — Z9221 Personal history of antineoplastic chemotherapy: Secondary | ICD-10-CM | POA: Diagnosis not present

## 2024-03-31 DIAGNOSIS — Z955 Presence of coronary angioplasty implant and graft: Secondary | ICD-10-CM | POA: Diagnosis not present

## 2024-03-31 DIAGNOSIS — Z79899 Other long term (current) drug therapy: Secondary | ICD-10-CM | POA: Insufficient documentation

## 2024-03-31 DIAGNOSIS — R918 Other nonspecific abnormal finding of lung field: Secondary | ICD-10-CM

## 2024-03-31 DIAGNOSIS — J449 Chronic obstructive pulmonary disease, unspecified: Secondary | ICD-10-CM | POA: Insufficient documentation

## 2024-03-31 DIAGNOSIS — I2510 Atherosclerotic heart disease of native coronary artery without angina pectoris: Secondary | ICD-10-CM

## 2024-03-31 DIAGNOSIS — F17210 Nicotine dependence, cigarettes, uncomplicated: Secondary | ICD-10-CM

## 2024-03-31 DIAGNOSIS — E119 Type 2 diabetes mellitus without complications: Secondary | ICD-10-CM | POA: Diagnosis not present

## 2024-03-31 DIAGNOSIS — Z923 Personal history of irradiation: Secondary | ICD-10-CM | POA: Diagnosis not present

## 2024-03-31 DIAGNOSIS — J984 Other disorders of lung: Secondary | ICD-10-CM | POA: Insufficient documentation

## 2024-03-31 DIAGNOSIS — M1990 Unspecified osteoarthritis, unspecified site: Secondary | ICD-10-CM | POA: Diagnosis not present

## 2024-03-31 DIAGNOSIS — F419 Anxiety disorder, unspecified: Secondary | ICD-10-CM | POA: Insufficient documentation

## 2024-03-31 DIAGNOSIS — R846 Abnormal cytological findings in specimens from respiratory organs and thorax: Secondary | ICD-10-CM | POA: Diagnosis not present

## 2024-03-31 SURGERY — VIDEO BRONCHOSCOPY WITH ENDOBRONCHIAL NAVIGATION
Anesthesia: General

## 2024-03-31 MED ORDER — LIDOCAINE 2% (20 MG/ML) 5 ML SYRINGE
INTRAMUSCULAR | Status: DC | PRN
  Administered 2024-03-31: 60 mg via INTRAVENOUS

## 2024-03-31 MED ORDER — CHLORHEXIDINE GLUCONATE 0.12 % MT SOLN
15.0000 mL | Freq: Once | OROMUCOSAL | Status: AC
  Filled 2024-03-31: qty 15

## 2024-03-31 MED ORDER — MIDAZOLAM HCL 2 MG/2ML IJ SOLN
INTRAMUSCULAR | Status: DC | PRN
  Administered 2024-03-31: 2 mg via INTRAVENOUS

## 2024-03-31 MED ORDER — ONDANSETRON HCL 4 MG/2ML IJ SOLN
INTRAMUSCULAR | Status: DC | PRN
  Administered 2024-03-31: 4 mg via INTRAVENOUS

## 2024-03-31 MED ORDER — PROPOFOL 10 MG/ML IV BOLUS
INTRAVENOUS | Status: DC | PRN
Start: 2024-03-31 — End: 2024-03-31
  Administered 2024-03-31: 100 ug/kg/min via INTRAVENOUS
  Administered 2024-03-31: 120 mg via INTRAVENOUS

## 2024-03-31 MED ORDER — FENTANYL CITRATE (PF) 100 MCG/2ML IJ SOLN
INTRAMUSCULAR | Status: AC
  Filled 2024-03-31: qty 2

## 2024-03-31 MED ORDER — MIDAZOLAM HCL 2 MG/2ML IJ SOLN
INTRAMUSCULAR | Status: AC
  Filled 2024-03-31: qty 2

## 2024-03-31 MED ORDER — PHENYLEPHRINE HCL-NACL 20-0.9 MG/250ML-% IV SOLN
INTRAVENOUS | Status: DC | PRN
  Administered 2024-03-31: 40 ug/min via INTRAVENOUS

## 2024-03-31 MED ORDER — SUGAMMADEX SODIUM 200 MG/2ML IV SOLN
INTRAVENOUS | Status: DC | PRN
  Administered 2024-03-31: 200 mg via INTRAVENOUS

## 2024-03-31 MED ORDER — LACTATED RINGERS IV SOLN: INTRAVENOUS | Status: DC

## 2024-03-31 MED ORDER — ACETAMINOPHEN 500 MG PO TABS
1000.0000 mg | ORAL_TABLET | Freq: Once | ORAL | Status: AC
  Administered 2024-03-31: 1000 mg via ORAL
  Filled 2024-03-31: qty 2

## 2024-03-31 MED ORDER — FENTANYL CITRATE (PF) 250 MCG/5ML IJ SOLN
INTRAMUSCULAR | Status: DC | PRN
Start: 2024-03-31 — End: 2024-03-31
  Administered 2024-03-31 (×2): 50 ug via INTRAVENOUS

## 2024-03-31 MED ORDER — ROCURONIUM BROMIDE 10 MG/ML (PF) SYRINGE
PREFILLED_SYRINGE | INTRAVENOUS | Status: DC | PRN
  Administered 2024-03-31: 30 mg via INTRAVENOUS
  Administered 2024-03-31: 20 mg via INTRAVENOUS
  Administered 2024-03-31: 40 mg via INTRAVENOUS
  Administered 2024-03-31: 30 mg via INTRAVENOUS
  Administered 2024-03-31: 20 mg via INTRAVENOUS

## 2024-03-31 MED ORDER — CHLORHEXIDINE GLUCONATE 0.12 % MT SOLN
OROMUCOSAL | Status: AC
  Administered 2024-03-31: 15 mL via OROMUCOSAL
  Filled 2024-03-31: qty 15

## 2024-03-31 MED ORDER — HYDROMORPHONE HCL 1 MG/ML IJ SOLN: 0.2500 mg | INTRAMUSCULAR | Status: DC | PRN

## 2024-03-31 NOTE — Discharge Instructions (Addendum)
 Do not drive or engage in heavy physical activity for 24 hours.  You may cough up small amounts of blood over the next few days.  You may use acetaminophen  (Tylenol ) if needed for discomfort.  You may use an over-the-counter cough medication or throat lozenge if you wish.  Resume clopidogrel  (Plavix ) tomorrow.  Call [phone] if you develop chest pain, shortness of breath, or cough up more than a tablespoon of blood.  My office will contact you with a follow up appointment.  Follow up with Dr. Marguerita Shih as scheduled.

## 2024-03-31 NOTE — Op Note (Signed)
 Video Bronchoscopy with Robotic Assisted Bronchoscopic Navigation   Date of Operation: 03/31/2024   Pre-op Diagnosis: Right upper left upper lobe lung nodules  Post-op Diagnosis: Same  Surgeon: Landon Pinion C. Luna Salinas, MD Co-surgeon: Racheal Buddle, MD  Assistants: None  Anesthesia: General endotracheal anesthesia  Operation: Flexible video fiberoptic bronchoscopy with robotic assistance and biopsies.  Estimated Blood Loss: Minimal  Complications: None  Indications and History: Francisco Austin is a 73 y.o. male with history of small cell carcinoma.  Now has an enlarging right upper lobe lung nodule and a cystic left upper lobe lung nodule.  Recommendation made to achieve a tissue diagnosis via robotic assisted navigational bronchoscopy.  The risks, benefits, complications, treatment options and expected outcomes were discussed with the patient.  The possibilities of pneumothorax, pneumonia, reaction to medication, pulmonary aspiration, perforation of a viscus, bleeding, failure to diagnose a condition and creating a complication requiring transfusion or operation were discussed with the patient who freely signed the consent.    Description of Procedure: The patient was seen in the Preoperative Area, was examined and was deemed appropriate to proceed.  The patient was taken to [facility] room 3, identified as Enrico Hartshorn and the procedure verified as Flexible Video Fiberoptic Bronchoscopy.  A Time Out was held and the above information confirmed.   Prior to the date of the procedure a high-resolution CT scan of the chest was performed. Utilizing ION software program a virtual tracheobronchial tree was generated to allow the creation of distinct navigation pathways to the patient's parenchymal abnormalities. After being taken to the operating room general anesthesia was initiated and the patient  was orally intubated. The video fiberoptic bronchoscope was introduced via the endotracheal tube  and a general inspection was performed which showed normal right and left lung anatomy. Aspiration of the bilateral mainstems was completed to remove any remaining secretions. Robotic catheter inserted into patient's endotracheal tube.   Target #1 right upper lobe nodule: The distinct navigation pathways prepared prior to this procedure were then utilized to navigate to patient's lesion identified on CT scan. The robotic catheter was secured into place and the vision probe was withdrawn.  Lesion location was approximated using fluoroscopy.  Local registration and targeting was performed using [employer] Cios mobile C-arm three-dimensional imaging. Under fluoroscopic guidance transbronchial needle biopsies and transbronchial forceps biopsies were performed to be sent for cytology and pathology.  Needle-in-lesion was confirmed using Cios mobile C-arm.  Quick prep only demonstrated blood.  Target #2 left upper lobe nodule: The distinct navigation pathways prepared prior to this procedure were then utilized to navigate to patient's lesion identified on CT scan. The robotic catheter was secured into place and the vision probe was withdrawn.  Lesion location was approximated using fluoroscopy.  Local registration and targeting was performed using [employer] Cios mobile C-arm three-dimensional imaging. Under fluoroscopic guidance transbronchial needle brushings, transbronchial needle biopsies, and transbronchial forceps biopsies were performed to be sent for cytology and pathology.  A bronchioalveolar lavage was performed and sent for cytology.    At the end of the procedure a general airway inspection was performed and there was no evidence of active bleeding. The bronchoscope was removed.  The patient tolerated the procedure well. There was no significant blood loss and there were no obvious complications.  Fluoroscopy showed no evidence of pneumothorax on either side.  Samples Target  #1:  Transbronchial Wang needle biopsies from RUL Transbronchial forceps biopsies from RUL   Samples Target #2: Transbronchial Donna Fus  needle biopsies from LUL Transbronchial forceps biopsies from LUL Bronchoalveolar lavage from LUL   Plans:  The patient will be discharged from the PACU to home when recovered from anesthesia and after chest x-ray is reviewed. We will review the cytology, pathology and microbiology results with the patient when they become available. Outpatient followup will be with myself and Dr. Marguerita Shih.

## 2024-03-31 NOTE — Transfer of Care (Signed)
 Immediate Anesthesia Transfer of Care Note  Patient: Francisco Austin  Procedure(s) Performed: VIDEO BRONCHOSCOPY WITH ENDOBRONCHIAL NAVIGATION BRONCHOSCOPY, WITH NEEDLE ASPIRATION BIOPSY BRONCHOSCOPY, WITH BIOPSY IRRIGATION, BRONCHUS  Patient Location: PACU  Anesthesia Type:General  Level of Consciousness: awake  Airway & Oxygen Therapy: Patient Spontanous Breathing and Patient connected to face mask oxygen  Post-op Assessment: Report given to RN and Post -op Vital signs reviewed and stable  Post vital signs: Reviewed and stable  Last Vitals:  Vitals Value Taken Time  BP 96/56 03/31/24 0953  Temp    Pulse 59 03/31/24 0956  Resp 16 03/31/24 0956  SpO2 97 % 03/31/24 0956  Vitals shown include unfiled device data.  Last Pain:  Vitals:   03/31/24 1610  TempSrc:   PainSc: 7       Patients Stated Pain Goal: 2 (03/31/24 9604)  Complications: No notable events documented.

## 2024-03-31 NOTE — Brief Op Note (Signed)
 03/31/2024  9:56 AM  PATIENT:  Francisco Austin  73 y.o. male  PRE-OPERATIVE DIAGNOSIS:  RIGHT AND LEFT UPPER LOBE NODULES  POST-OPERATIVE DIAGNOSIS: RIGHT AND LEFT UPPER LOBE NODULES  PROCEDURE:  Procedure(s) with comments: VIDEO BRONCHOSCOPY WITH ENDOBRONCHIAL NAVIGATION (N/A) - robotic bronchoscopy for biopsy of right upper and left upper lobe nodules BRONCHOSCOPY, WITH NEEDLE ASPIRATION BIOPSY BRONCHOSCOPY, WITH BIOPSY IRRIGATION, BRONCHUS  SURGEON:  Surgeons and Role:    * Zelphia Higashi, MD - Primary    * Denson Flake, MD - Assisting  PHYSICIAN ASSISTANT:   ASSISTANTS: none   ANESTHESIA:   general  EBL:  5 mL   BLOOD ADMINISTERED:none  DRAINS: none   LOCAL MEDICATIONS USED:  NONE  SPECIMEN:  Source of Specimen:  RUL and LUL nodules  DISPOSITION OF SPECIMEN:  PATHOLOGY  COUNTS:  NO endoscopic  TOURNIQUET:  * No tourniquets in log *  DICTATION: .note in chart  PLAN OF CARE: Discharge to home after PACU  PATIENT DISPOSITION:  PACU - hemodynamically stable.   Delay start of Pharmacological VTE agent (>24hrs) due to surgical blood loss or risk of bleeding: not applicable

## 2024-03-31 NOTE — Anesthesia Procedure Notes (Signed)
 Procedure Name: Intubation Date/Time: 03/31/2024 8:02 AM  Performed by: Johann Muta, CRNAPre-anesthesia Checklist: Patient identified, Emergency Drugs available, Suction available and Patient being monitored Patient Re-evaluated:Patient Re-evaluated prior to induction Oxygen Delivery Method: Circle System Utilized Preoxygenation: Pre-oxygenation with 100% oxygen Induction Type: IV induction Ventilation: Mask ventilation without difficulty Laryngoscope Size: Mac and 4 Grade View: Grade I Tube type: Oral Number of attempts: 1 Airway Equipment and Method: Stylet and Oral airway Placement Confirmation: ETT inserted through vocal cords under direct vision, positive ETCO2 and breath sounds checked- equal and bilateral Secured at: 24 cm Tube secured with: Tape Dental Injury: Teeth and Oropharynx as per pre-operative assessment

## 2024-03-31 NOTE — Anesthesia Postprocedure Evaluation (Signed)
 Anesthesia Post Note  Patient: Francisco Austin  Procedure(s) Performed: VIDEO BRONCHOSCOPY WITH ENDOBRONCHIAL NAVIGATION BRONCHOSCOPY, WITH NEEDLE ASPIRATION BIOPSY BRONCHOSCOPY, WITH BIOPSY IRRIGATION, BRONCHUS     Patient location during evaluation: PACU Anesthesia Type: General Level of consciousness: awake and alert Pain management: pain level controlled Vital Signs Assessment: post-procedure vital signs reviewed and stable Respiratory status: spontaneous breathing, nonlabored ventilation and respiratory function stable Cardiovascular status: blood pressure returned to baseline and stable Postop Assessment: no apparent nausea or vomiting Anesthetic complications: no  No notable events documented.  Last Vitals:  Vitals:   03/31/24 1052 03/31/24 1100  BP:  101/60  Pulse: 67 68  Resp: 13 15  Temp:  36.8 C  SpO2: 95% 95%    Last Pain:  Vitals:   03/31/24 1100  TempSrc:   PainSc: 0-No pain                 Atonya Templer,W. EDMOND

## 2024-03-31 NOTE — Interval H&P Note (Signed)
 History and Physical Interval Note:  03/31/2024 7:43 AM  Francisco Austin  has presented today for surgery, with the diagnosis of RIGHT AND LEFT UPPER LOBE NODULES.  The various methods of treatment have been discussed with the patient and family. After consideration of risks, benefits and other options for treatment, the patient has consented to  Procedure(s) with comments: BRONCHOSCOPY, WITH BIOPSY USING ELECTROMAGNETIC NAVIGATION (N/A) - robotic bronchoscopy for biopsy of right upper and left upper lobe nodules as a surgical intervention.  The patient's history has been reviewed, patient examined, no change in status, stable for surgery.  I have reviewed the patient's chart and labs.  Questions were answered to the patient's satisfaction.     Zelphia Higashi

## 2024-04-01 ENCOUNTER — Encounter (HOSPITAL_COMMUNITY): Payer: Self-pay | Admitting: Thoracic Surgery (Cardiothoracic Vascular Surgery)

## 2024-04-01 LAB — CYTOLOGY - NON PAP

## 2024-04-05 ENCOUNTER — Ambulatory Visit
Attending: Thoracic Surgery (Cardiothoracic Vascular Surgery) | Admitting: Thoracic Surgery (Cardiothoracic Vascular Surgery)

## 2024-04-05 VITALS — BP 108/70 | HR 64 | Resp 20 | Ht 70.0 in | Wt 193.8 lb

## 2024-04-05 DIAGNOSIS — C3492 Malignant neoplasm of unspecified part of left bronchus or lung: Secondary | ICD-10-CM

## 2024-04-05 DIAGNOSIS — R918 Other nonspecific abnormal finding of lung field: Secondary | ICD-10-CM | POA: Diagnosis not present

## 2024-04-05 NOTE — Progress Notes (Signed)
 [address], [place] [address]             [phone]      HPI: Mr. Colao returns to discuss the results of his bronchoscopy.  Desmin Daleo is a 73 year old man with a history of tobacco use, limited stage small cell lung cancer, squamous cell carcinoma of the lung, COPD, hypertension, hyperlipidemia, and coronary disease with 3 drug-eluting stents.  He was diagnosed with limited stage small cell carcinoma in 2023.  Also noted to have a small endobronchial squamous cell carcinoma.  Treated with chemoradiation.  He has been monitored since then.  He has had a right upper lobe nodule that had increased in size.  Markedly hypermetabolic on PET.  He also has a cystic left upper lobe lesion that has been present had low-grade activity.  I did robotic bronchoscopy and biopsied both nodules on 03/31/2024.  He went home the day of the procedure.  Said he felt very sore across his chest like he had been kicked in the sternum for a couple of days.  Now resolved.  Past Medical History:  Diagnosis Date   Agent orange exposure    Anxiety    Arthritis    Back pain    CAD (coronary artery disease)    Chronic chest pain    COPD (chronic obstructive pulmonary disease) (HCC)    Coronary vasospasm (HCC)    Headache    cluster   History of kidney stones    HLD (hyperlipidemia)    HLD (hyperlipidemia)    HTN (hypertension)    Lymphadenopathy, hilar    left   Mediastinal lymphadenopathy    Pre-diabetes    Small cell carcinoma (HCC)    Squamous cell carcinoma of lung (HCC)     Current Outpatient Medications  Medication Sig Dispense Refill   albuterol  (VENTOLIN  HFA) 108 (90 Base) MCG/ACT inhaler Inhale 2 puffs into the lungs every 6 (six) hours as needed for wheezing or shortness of breath.     aspirin  EC 81 MG tablet Take 81 mg by mouth daily.     atorvastatin  (LIPITOR ) 80 MG tablet Take 1 tablet (80 mg total) by mouth daily. 90 tablet 1   Cholecalciferol 50 MCG  (2000 UT) TABS Take 2,000 Units by mouth daily.     clopidogrel  (PLAVIX ) 75 MG tablet TAKE ONE TABLET BY MOUTH ONCE DAILY WITH BREAKFAST 90 tablet 3   diclofenac sodium (VOLTAREN) 1 % GEL Apply 1 g topically daily as needed (pain).     diltiazem  (TIAZAC ) 240 MG 24 hr capsule Take 240 mg by mouth daily.     esomeprazole (NEXIUM) 40 MG capsule Take 40 mg by mouth daily.     HYDROcodone -acetaminophen  (NORCO) 10-325 MG tablet Take 1 tablet by mouth 5 (five) times daily.     LORazepam  (ATIVAN ) 1 MG tablet Take 1 mg by mouth See admin instructions. Take 1mg  (1 tablet) by mouth twice daily and 0.5mg  (1/2 tablet) in addition daily.     nitroGLYCERIN  (NITROSTAT ) 0.4 MG SL tablet Place 0.4 mg under the tongue every 5 (five) minutes as needed for chest pain.     telmisartan (MICARDIS) 80 MG tablet Take 80 mg by mouth daily.     umeclidinium-vilanterol (ANORO ELLIPTA) 62.5-25 MCG/INH AEPB Inhale 1 puff into the lungs daily.     vitamin B-12 (CYANOCOBALAMIN) 500 MCG tablet Take 500 mcg by mouth daily.     No current facility-administered medications for  this visit.   Facility-Administered Medications Ordered in Other Visits  Medication Dose Route Frequency Provider Last Rate Last Admin   prochlorperazine  (COMPAZINE ) 10 MG/2ML injection             Physical Exam BP 108/70 (BP Location: Right Arm, Patient Position: Sitting, Cuff Size: Normal)   Pulse 64   Resp 20   Ht 5\' 10"  (1.778 m)   Wt 193 lb 12.8 oz (87.9 kg)   SpO2 93% Comment: RA  BMI 27.99 kg/m  73 year old man in no acute distress Alert and oriented x 3  Diagnostic Tests: FINAL MICROSCOPIC DIAGNOSIS:  A. LUNG, RUL, FINE NEEDLE ASPIRATION  BIOPSY:  - Atypical cells present   Pathology on surgical biopsy is pending  Impression: Demontez Novack is a 73 year old man with a history of tobacco use, limited stage small cell lung cancer, squamous cell carcinoma of the lung, COPD, hypertension, hyperlipidemia, and coronary disease with 3  drug-eluting stents.  Now has an enlarging subsolid nodule in the right upper lobe.  Unfortunately had a lot of bleeding with biopsies and cytology only showed atypical cells.  In my opinion based on the tumor morphology and presence of atypical cells it would be reasonable to treat this with stereotactic radiation.  Will discuss at our tumor conference.  He has a follow-up appointment scheduled with Dr. Marguerita Shih on Monday   Zelphia Higashi, MD [facility] [phone]

## 2024-04-06 ENCOUNTER — Ambulatory Visit: Admitting: Thoracic Surgery (Cardiothoracic Vascular Surgery)

## 2024-04-07 ENCOUNTER — Other Ambulatory Visit: Payer: Self-pay

## 2024-04-07 NOTE — Progress Notes (Signed)
 The proposed treatment discussed in conference is for discussion purpose only and is not a binding recommendation.  The patients have not been physically examined, or presented with their treatment options.  Therefore, final treatment plans cannot be decided.

## 2024-04-11 ENCOUNTER — Inpatient Hospital Stay (HOSPITAL_BASED_OUTPATIENT_CLINIC_OR_DEPARTMENT_OTHER): Admitting: Internal Medicine

## 2024-04-11 ENCOUNTER — Inpatient Hospital Stay: Attending: Internal Medicine

## 2024-04-11 VITALS — BP 107/72 | HR 63 | Temp 98.0°F | Resp 17 | Ht 70.0 in | Wt 190.5 lb

## 2024-04-11 DIAGNOSIS — C3490 Malignant neoplasm of unspecified part of unspecified bronchus or lung: Secondary | ICD-10-CM | POA: Diagnosis not present

## 2024-04-11 DIAGNOSIS — C3412 Malignant neoplasm of upper lobe, left bronchus or lung: Secondary | ICD-10-CM | POA: Diagnosis not present

## 2024-04-11 LAB — CMP (CANCER CENTER ONLY)
ALT: 13 U/L (ref 0–44)
AST: 13 U/L — ABNORMAL LOW (ref 15–41)
Albumin: 4 g/dL (ref 3.5–5.0)
Alkaline Phosphatase: 79 U/L (ref 38–126)
Anion gap: 7 (ref 5–15)
BUN: 7 mg/dL — ABNORMAL LOW (ref 8–23)
CO2: 27 mmol/L (ref 22–32)
Calcium: 8.9 mg/dL (ref 8.9–10.3)
Chloride: 106 mmol/L (ref 98–111)
Creatinine: 0.81 mg/dL (ref 0.61–1.24)
GFR, Estimated: >60 mL/min (ref >60)
Glucose, Bld: 99 mg/dL (ref 70–99)
Potassium: 3.7 mmol/L (ref 3.5–5.1)
Sodium: 140 mmol/L (ref 135–145)
Total Bilirubin: 0.4 mg/dL (ref 0.0–1.2)
Total Protein: 6.7 g/dL (ref 6.5–8.1)

## 2024-04-11 LAB — CBC WITH DIFFERENTIAL (CANCER CENTER ONLY)
Abs Immature Granulocytes: 0.02 10*3/uL (ref 0.00–0.07)
Basophils Absolute: 0 10*3/uL (ref 0.0–0.1)
Basophils Relative: 1 %
Eosinophils Absolute: 0.2 10*3/uL (ref 0.0–0.5)
Eosinophils Relative: 4 %
HCT: 35.4 % — ABNORMAL LOW (ref 39.0–52.0)
Hemoglobin: 12 g/dL — ABNORMAL LOW (ref 13.0–17.0)
Immature Granulocytes: 0 %
Lymphocytes Relative: 20 %
Lymphs Abs: 1.2 10*3/uL (ref 0.7–4.0)
MCH: 32 pg (ref 26.0–34.0)
MCHC: 33.9 g/dL (ref 30.0–36.0)
MCV: 94.4 fL (ref 80.0–100.0)
Monocytes Absolute: 0.7 10*3/uL (ref 0.1–1.0)
Monocytes Relative: 13 %
Neutro Abs: 3.7 10*3/uL (ref 1.7–7.7)
Neutrophils Relative %: 62 %
Platelet Count: 292 10*3/uL (ref 150–400)
RBC: 3.75 MIL/uL — ABNORMAL LOW (ref 4.22–5.81)
RDW: 14.2 % (ref 11.5–15.5)
WBC Count: 5.9 10*3/uL (ref 4.0–10.5)
nRBC: 0 % (ref 0.0–0.2)

## 2024-04-11 NOTE — Progress Notes (Signed)
 [facility] [phone]   Fax:([phone]  OFFICE PROGRESS NOTE  Roselind Congo, MD [phone] [place] [address]  DIAGNOSIS:  limited stage (T1c, N2, M0) small cell lung cancer with mixture of squamous cell carcinoma in the left upper lobe presented with left upper lobe lung nodule in addition to left hilar and mediastinal lymphadenopathy diagnosed in May 2023.  PRIOR THERAPY:  1) Systemic chemotherapy with cisplatin  75 Mg/M2 on day 1 and etoposide  100 Mg/M2 on days 1, 2 and 3 every 3 weeks for 4 cycles.  This was concurrent with radiotherapy for around 6 weeks during this course of treatment.  Status post 4 cycles.  Last cycle was given on 06/09/2022. 2) prophylactic cranial irradiation under the care of Dr. Lorri Rota. 3) SBRT to right upper lobe lung nodule under the care of Dr. Lorri Rota.  CURRENT THERAPY: Observation.  INTERVAL HISTORY: Francisco Austin 73 y.o. male returns to the clinic today for 49-month follow-up visit accompanied by his wife. Discussed the use of AI scribe software for clinical note transcription with the patient, who gave verbal consent to proceed.  History of Present Illness   Francisco Austin is a 73 year old male with limited stage small cell lung cancer who presents for follow-up of a right upper lobe nodule. He is accompanied by his wife. He was referred by Dr. Luna Salinas, the thoracic surgeon, for follow-up after bronchoscopy findings.  He has a history of limited stage small cell lung cancer diagnosed in May 2023. Treatment included systemic chemotherapy with cisplatin  and etoposide  for four cycles, concurrent with radiation therapy, followed by prophylactic cranial irradiation. No further chemotherapy is planned at this time.  Recent imaging studies revealed a 1.4 by 1.1 cm part solid nodule in the right upper lobe. A bronchoscopy showed atypical cells suspicious for cancer. The case was discussed in a  multidisciplinary conference, and the consensus was to treat the nodule with radiation therapy.  No pain or discomfort is reported at the time of the visit.        MEDICAL HISTORY: Past Medical History:  Diagnosis Date   Agent orange exposure    Anxiety    Arthritis    Back pain    CAD (coronary artery disease)    Chronic chest pain    COPD (chronic obstructive pulmonary disease) (HCC)    Coronary vasospasm (HCC)    Headache    cluster   History of kidney stones    HLD (hyperlipidemia)    HLD (hyperlipidemia)    HTN (hypertension)    Lymphadenopathy, hilar    left   Mediastinal lymphadenopathy    Pre-diabetes    Small cell carcinoma (HCC)    Squamous cell carcinoma of lung (HCC)     ALLERGIES:  is allergic to gabapentin, morphine, other, prednisone, alprazolam, fentanyl , niacin, and penicillamine.  MEDICATIONS:  Current Outpatient Medications  Medication Sig Dispense Refill   albuterol  (VENTOLIN  HFA) 108 (90 Base) MCG/ACT inhaler Inhale 2 puffs into the lungs every 6 (six) hours as needed for wheezing or shortness of breath.     aspirin  EC 81 MG tablet Take 81 mg by mouth daily.     atorvastatin  (LIPITOR ) 80 MG tablet Take 1 tablet (80 mg total) by mouth daily. 90 tablet 1   Cholecalciferol 50 MCG (2000 UT) TABS Take 2,000 Units by mouth daily.     clopidogrel  (PLAVIX ) 75 MG tablet TAKE ONE TABLET BY  MOUTH ONCE DAILY WITH BREAKFAST 90 tablet 3   diclofenac sodium (VOLTAREN) 1 % GEL Apply 1 g topically daily as needed (pain).     diltiazem  (TIAZAC ) 240 MG 24 hr capsule Take 240 mg by mouth daily.     esomeprazole (NEXIUM) 40 MG capsule Take 40 mg by mouth daily.     HYDROcodone -acetaminophen  (NORCO) 10-325 MG tablet Take 1 tablet by mouth 5 (five) times daily.     LORazepam  (ATIVAN ) 1 MG tablet Take 1 mg by mouth See admin instructions. Take 1mg  (1 tablet) by mouth twice daily and 0.5mg  (1/2 tablet) in addition daily.     nitroGLYCERIN  (NITROSTAT ) 0.4 MG SL tablet Place  0.4 mg under the tongue every 5 (five) minutes as needed for chest pain.     telmisartan (MICARDIS) 80 MG tablet Take 80 mg by mouth daily.     umeclidinium-vilanterol (ANORO ELLIPTA) 62.5-25 MCG/INH AEPB Inhale 1 puff into the lungs daily.     vitamin B-12 (CYANOCOBALAMIN) 500 MCG tablet Take 500 mcg by mouth daily.     No current facility-administered medications for this visit.   Facility-Administered Medications Ordered in Other Visits  Medication Dose Route Frequency Provider Last Rate Last Admin   prochlorperazine  (COMPAZINE ) 10 MG/2ML injection             SURGICAL HISTORY:  Past Surgical History:  Procedure Laterality Date   APPENDECTOMY     BRONCHIAL BIOPSY  03/31/2024   Procedure: BRONCHOSCOPY, WITH BIOPSY;  Surgeon: Zelphia Higashi, MD;  Location: [facility] ENDOSCOPY;  Service: Thoracic;;   BRONCHIAL NEEDLE ASPIRATION BIOPSY  03/31/2024   Procedure: BRONCHOSCOPY, WITH NEEDLE ASPIRATION BIOPSY;  Surgeon: Zelphia Higashi, MD;  Location: [facility] ENDOSCOPY;  Service: Thoracic;;   BRONCHIAL WASHINGS  03/31/2024   Procedure: IRRIGATION, BRONCHUS;  Surgeon: Zelphia Higashi, MD;  Location: [facility] ENDOSCOPY;  Service: Thoracic;;   CARDIAC CATHETERIZATION     multiple with Dr. Anastasia Balo (no stent placed at that time)   CORONARY STENT INTERVENTION N/A 12/24/2021   Procedure: CORONARY STENT INTERVENTION;  Surgeon: Lucendia Rusk, MD;  Location: [facility] INVASIVE CV LAB;  Service: Cardiovascular;  Laterality: N/A;   CORONARY ULTRASOUND/IVUS N/A 12/24/2021   Procedure: Intravascular Ultrasound/IVUS;  Surgeon: Lucendia Rusk, MD;  Location: [facility] INVASIVE CV LAB;  Service: Cardiovascular;  Laterality: N/A;   HEMORROIDECTOMY     LEFT HEART CATH AND CORONARY ANGIOGRAPHY N/A 12/23/2021   Procedure: LEFT HEART CATH AND CORONARY ANGIOGRAPHY;  Surgeon: Avanell Leigh, MD;  Location: [facility] INVASIVE CV LAB;  Service: Cardiovascular;  Laterality: N/A;   TONSILLECTOMY     VIDEO BRONCHOSCOPY WITH  ENDOBRONCHIAL NAVIGATION N/A 03/31/2024   Procedure: VIDEO BRONCHOSCOPY WITH ENDOBRONCHIAL NAVIGATION;  Surgeon: Zelphia Higashi, MD;  Location: [facility] ENDOSCOPY;  Service: Thoracic;  Laterality: N/A;  robotic bronchoscopy for biopsy of right upper and left upper lobe nodules   VIDEO BRONCHOSCOPY WITH ENDOBRONCHIAL ULTRASOUND N/A 03/12/2022   Procedure: VIDEO BRONCHOSCOPY WITH ENDOBRONCHIAL ULTRASOUND;  Surgeon: Zelphia Higashi, MD;  Location: [facility] OR;  Service: Thoracic;  Laterality: N/A;    REVIEW OF SYSTEMS:  Constitutional: positive for fatigue Eyes: negative Ears, nose, mouth, throat, and face: negative Respiratory: negative Cardiovascular: negative Gastrointestinal: negative Genitourinary:negative Integument/breast: negative Hematologic/lymphatic: negative Musculoskeletal:negative Neurological: negative Behavioral/Psych: negative Endocrine: negative Allergic/Immunologic: negative   PHYSICAL EXAMINATION: General appearance: alert, cooperative, fatigued, and no distress Head: Normocephalic, without obvious abnormality, atraumatic Neck: no adenopathy, no JVD, supple, symmetrical, trachea midline, and thyroid  not enlarged, symmetric, no tenderness/mass/nodules  Lymph nodes: Cervical, supraclavicular, and axillary nodes normal. Resp: clear to auscultation bilaterally Back: symmetric, no curvature. ROM normal. No CVA tenderness. Cardio: regular rate and rhythm, S1, S2 normal, no murmur, click, rub or gallop GI: soft, non-tender; bowel sounds normal; no masses,  no organomegaly Extremities: extremities normal, atraumatic, no cyanosis or edema Neurologic: Alert and oriented X 3, normal strength and tone. Normal symmetric reflexes. Normal coordination and gait  ECOG PERFORMANCE STATUS: 1 - Symptomatic but completely ambulatory  Blood pressure 107/72, pulse 63, temperature 98 F (36.7 C), temperature source Tympanic, resp. rate 17, height 5\' 10"  (1.778 m), weight 190 lb 8 oz (86.4  kg), SpO2 96%.  LABORATORY DATA: Lab Results  Component Value Date   WBC 5.9 04/11/2024   HGB 12.0 (L) 04/11/2024   HCT 35.4 (L) 04/11/2024   MCV 94.4 04/11/2024   PLT 292 04/11/2024      Chemistry      Component Value Date/Time   NA 140 04/11/2024 1007   NA 140 12/13/2021 0900   K 3.7 04/11/2024 1007   CL 106 04/11/2024 1007   CO2 27 04/11/2024 1007   BUN 7 (L) 04/11/2024 1007   BUN 9 12/13/2021 0900   CREATININE 0.81 04/11/2024 1007      Component Value Date/Time   CALCIUM  8.9 04/11/2024 1007   ALKPHOS 79 04/11/2024 1007   AST 13 (L) 04/11/2024 1007   ALT 13 04/11/2024 1007   BILITOT 0.4 04/11/2024 1007       RADIOGRAPHIC STUDIES: DG C-Arm 1-60 Min-No Report Result Date: 03/31/2024 Fluoroscopy was utilized by the requesting physician.  No radiographic interpretation.   DG C-ARM BRONCHOSCOPY Result Date: 03/31/2024 C-ARM BRONCHOSCOPY: Fluoroscopy was utilized by the requesting physician.  No radiographic interpretation.   DG Chest 2 View Result Date: 03/29/2024 CLINICAL DATA:  Preop chest exam.  Pulmonary nodules. EXAM: CHEST - 2 VIEW COMPARISON:  CT 01/25/2024 FINDINGS: The heart is normal in size. Left hilar prominence, may represent post radiation change. Right upper lobe nodule on prior CT is not well seen by radiograph. The lungs are hyperinflated with emphysema. No pneumothorax or pleural effusion. On limited assessment, no acute osseous findings. IMPRESSION: 1. Emphysema. 2. Left hilar prominence, may represent post radiation change. 3. Right upper lobe nodule on prior CT is not well seen by radiograph. Electronically Signed   By: Chadwick Colonel M.D.   On: 03/29/2024 21:18   MYOCARDIAL PERFUSION/CT RAD READ Result Date: 03/28/2024 CLINICAL DATA:  This over-read does not include interpretation of cardiac or coronary anatomy or pathology. The cardiac SPECT CT interpretation by the cardiologist is attached. COMPARISON:  PET-CT February 08, 2024 and chest CT January 25, 2024 FINDINGS: Vascular: Aortic atherosclerosis. Lipomatous hypertrophy of the intra-atrial septum. Coronary artery calcifications/stents. Mediastinum/Nodes: Stable prominent mediastinal lymph nodes. Hilar structures not well evaluated on noncontrast enhanced examination. Lungs/Pleura: Similar left perihilar irregular consolidative opacities. Stable 11 mm irregular right upper lobe pulmonary nodule which was hypermetabolic on prior PET-CT. Upper Abdomen: Similar left adrenal thickening. Nonobstructive renal stones. Musculoskeletal: Multilevel degenerative change of the spine. IMPRESSION: 1. Similar left perihilar irregular consolidative opacities. 2. Stable 11 mm irregular right upper lobe pulmonary nodule which was hypermetabolic on prior PET-CT. 3. Stable prominent mediastinal lymph nodes. 4. Similar left adrenal thickening. 5. Nonobstructive renal stones. 6. Aortic atherosclerosis. Aortic Atherosclerosis (ICD10-I70.0). Electronically Signed   By: Tama Fails M.D.   On: 03/28/2024 12:57   MYOCARDIAL PERFUSION IMAGING Result Date: 03/25/2024   The study is normal.  The study is low risk.   No ST deviation was noted.   LV perfusion is normal.   Left ventricular function is normal. Nuclear stress EF: 74%. The left ventricular ejection fraction is hyperdynamic (>65%). End diastolic cavity size is normal. End systolic cavity size is normal.   CT images were obtained for attenuation correction and were examined for the presence of coronary calcium  when appropriate.   Coronary calcium  assessment not performed due to prior revascularization.   Prior study not available for comparison.   Electronically signed by Luana Rumple, MD Low risk stress nuclear study with normal perfusion and normal left ventricular regional and global systolic function.  MR BRAIN W WO CONTRAST Result Date: 03/16/2024 EXAMINATION: MR BRAIN W WO CONTRAST HISTORY: Small cell lung cancer (SCLC), staging TECHNIQUE: MRI of the brain performed  with and without IV contrast. CONTRAST: 8.5 cc gadavist  IV COMPARISON: 07/30/2022 FINDINGS: No evidence for intracranial mass, hemorrhage, or acute infarct. There is mild diffuse cerebral volume loss. There are new extensive bilateral periventricular and subcortical white matter T2 hyperintensities. The ventricles are symmetric and the basilar cisterns are patent. The paranasal sinuses and mastoid air cells are well aerated. The orbits are normal. No abnormal enhancement is appreciated following the administration of intravenous contrast material. IMPRESSION: No evidence for active intracranial metastases. New extensive white matter changes which could be related to postradiation changes. Correlate clinically. Electronically signed by: [place] Engel MD 03/16/2024 06:29 AM EDT RP Workstation: [id number]      ASSESSMENT AND PLAN: This is a very pleasant 73 years old white male diagnosed with limited stage (T1c, N2, M0) small cell lung cancer with mixture of squamous cell carcinoma in the left upper lobe presented with left upper lobe lung nodule in addition to left hilar and mediastinal lymphadenopathy diagnosed in May 2023.  The patient underwent systemic chemotherapy with cisplatin  75 Mg/M2 on day 1 and etoposide  100 Mg/M2 on days 1, 2 and 3 status post 4 cycles.  This is concurrent with radiotherapy.  Last dose of chemotherapy was on 06/09/2022.  This was followed by prophylactic cranial irradiation. He was found to have part solid right upper lobe nodule and bronchoscopy revealed atypical cells suspicious for lung cancer. Assessment and Plan    Limited stage small cell lung cancer Limited stage small cell lung cancer diagnosed in May 2023 treated with systemic chemotherapy with cisplatin  75 Mg/M2 on day 1 and etoposide  100 Mg/M2 on days 1, 2 and 3 status post 4 cycles.  This is concurrent with radiotherapy.  Last dose of chemotherapy was on 06/09/2022.  This was followed by prophylactic cranial  irradiation.   He now presents with a 1.4 x 1.1 cm part solid nodule in the right upper lobe. Bronchoscopy revealed atypical cells suspicious for cancer. Multidisciplinary team, including thoracic surgeon and radiation oncologist, recommended radiation therapy for the nodule without additional chemotherapy or surgery. - Refer to radiation oncology for treatment of right upper lobe nodule - Order CT scan of the chest in four months to assess response to radiation therapy   The patient was advised to call immediately if he has any concerning symptoms in the interval.  The patient voices understanding of current disease status and treatment options and is in agreement with the current care plan.  All questions were answered. The patient knows to call the clinic with any problems, questions or concerns. We can certainly see the patient much sooner if necessary.  The total time spent in the appointment was  30 minutes.  Disclaimer: This note was dictated with voice recognition software. Similar sounding words can inadvertently be transcribed and may not be corrected upon review.

## 2024-04-12 ENCOUNTER — Encounter: Payer: Self-pay | Admitting: Radiation Oncology

## 2024-04-12 ENCOUNTER — Encounter: Admitting: Thoracic Surgery (Cardiothoracic Vascular Surgery)

## 2024-04-12 NOTE — Progress Notes (Signed)
 Thoracic Location of Tumor / Histology: Right Upper Lung  Patient presented as referral from Dr. Marlene Simas [facility]).  03/29/2024 Dr. Adair Hollingshead DG Chest 2 View CLINICAL DATA: Preop chest exam. Pulmonary nodules.   IMPRESSION: 1. Emphysema. 2. Left hilar prominence, may represent post radiation change. 3. Right upper lobe nodule on prior CT is not well seen by radiograph.   02/08/2024 Dr. Marlene Simas NM PET Image Restage (PS) Skull Base to Thigh Clinical Data:  Subsequent treatment strategy for non-small cell lung cancer.  IMPRESSION: 1. Part solid 1.4 by 1.1 cm right upper lobe nodule has maximum SUV of 10.5, compatible with malignancy. 2. Cystic lung lesion in the left upper lobe measuring about 1.9 by 1.5 cm has some minimal thickening of the margins and maximum SUV 1.4. Mild enlargement compared to 02/26/2022. Surveillance for low-grade adenocarcinoma suggested. 3. Previous left lower lobe infrahilar hypermetabolic nodule no longer visible. 4. Aortic Atherosclerosis (ICD10-I70.0) and Emphysema (ICD10-J43.9). 5. Coronary, aortic arch, and branch vessel atherosclerotic vascular disease. Mitral valve calcification. 6. Nonobstructive right nephrolithiasis. 7. Prominent stool throughout the colon favors constipation.   01/25/2024 Cassandra Heilingoetter CT Chest with Contrast Clinical Data:  Small cell lung cancer (SCLC), assess treatment response. * Tracking Code: BO *   IMPRESSION: 1. There is an irregular 1.4 x 1.5 cm solid noncalcified nodule in the right lung upper lobe which is slowly increasing in size and density when compared to the prior exams. Further evaluation with PET-CT scan versus short-term follow-up/tissue sampling is recommended. Otherwise, no new mass or consolidation. No new lymphadenopathy. 2. Multiple other nonacute observations, as described above. Aortic Atherosclerosis (ICD10-I70.0) and Emphysema  (ICD10-J43.9).  Past/Anticipated interventions by cardiothoracic surgery, if any: NA  Past/Anticipated interventions by medical oncology, if any:  Dr. Marguerita Shih   Tobacco/Marijuana/Snuff/ETOH use: Smokes pipe, no smokeless, drug or alcohol use.  Signs/Symptoms Weight changes, if any:  No Respiratory complaints, if any:  SOB with exertion Hemoptysis, if any: No Pain issues, if any:  5/10  SAFETY ISSUES: Prior radiation?  Yes, Lung Chemo-radiation 2023  brain Pacemaker/ICD?  No Possible current pregnancy? Male Is the patient on methotrexate? No  Current Complaints / other details:     BP 104/66   Pulse 67   Temp 97.8 F (36.6 C)   Resp 19   Wt 188 lb 12.8 oz (85.6 kg)   SpO2 93%   BMI 27.09 kg/m

## 2024-04-13 ENCOUNTER — Ambulatory Visit
Admission: RE | Admit: 2024-04-13 | Discharge: 2024-04-13 | Disposition: A | Discharge status: Home / Self Care | Source: Ambulatory Visit | Attending: Radiation Oncology | Admitting: Radiation Oncology

## 2024-04-13 VITALS — BP 104/66 | HR 67 | Temp 97.8°F | Resp 19 | Wt 188.8 lb

## 2024-04-13 DIAGNOSIS — Z7902 Long term (current) use of antithrombotics/antiplatelets: Secondary | ICD-10-CM | POA: Insufficient documentation

## 2024-04-13 DIAGNOSIS — Z809 Family history of malignant neoplasm, unspecified: Secondary | ICD-10-CM | POA: Insufficient documentation

## 2024-04-13 DIAGNOSIS — Z923 Personal history of irradiation: Secondary | ICD-10-CM | POA: Insufficient documentation

## 2024-04-13 DIAGNOSIS — F17210 Nicotine dependence, cigarettes, uncomplicated: Secondary | ICD-10-CM | POA: Diagnosis not present

## 2024-04-13 DIAGNOSIS — E785 Hyperlipidemia, unspecified: Secondary | ICD-10-CM | POA: Diagnosis not present

## 2024-04-13 DIAGNOSIS — I2510 Atherosclerotic heart disease of native coronary artery without angina pectoris: Secondary | ICD-10-CM | POA: Insufficient documentation

## 2024-04-13 DIAGNOSIS — Z79899 Other long term (current) drug therapy: Secondary | ICD-10-CM | POA: Diagnosis not present

## 2024-04-13 DIAGNOSIS — J439 Emphysema, unspecified: Secondary | ICD-10-CM | POA: Diagnosis not present

## 2024-04-13 DIAGNOSIS — C3412 Malignant neoplasm of upper lobe, left bronchus or lung: Secondary | ICD-10-CM | POA: Insufficient documentation

## 2024-04-13 DIAGNOSIS — Z7982 Long term (current) use of aspirin: Secondary | ICD-10-CM | POA: Diagnosis not present

## 2024-04-13 DIAGNOSIS — C3411 Malignant neoplasm of upper lobe, right bronchus or lung: Secondary | ICD-10-CM | POA: Diagnosis not present

## 2024-04-13 DIAGNOSIS — Z9221 Personal history of antineoplastic chemotherapy: Secondary | ICD-10-CM | POA: Insufficient documentation

## 2024-04-13 DIAGNOSIS — Z574 Occupational exposure to toxic agents in agriculture: Secondary | ICD-10-CM | POA: Insufficient documentation

## 2024-04-13 DIAGNOSIS — J449 Chronic obstructive pulmonary disease, unspecified: Secondary | ICD-10-CM | POA: Diagnosis not present

## 2024-04-13 DIAGNOSIS — N200 Calculus of kidney: Secondary | ICD-10-CM | POA: Diagnosis not present

## 2024-04-13 DIAGNOSIS — I700 Atherosclerosis of aorta: Secondary | ICD-10-CM | POA: Insufficient documentation

## 2024-04-13 DIAGNOSIS — I10 Essential (primary) hypertension: Secondary | ICD-10-CM | POA: Insufficient documentation

## 2024-04-13 DIAGNOSIS — R911 Solitary pulmonary nodule: Secondary | ICD-10-CM | POA: Insufficient documentation

## 2024-04-13 NOTE — Progress Notes (Signed)
 Radiation Oncology         [phone] ________________________________  Outpatient Re-Consultation  Name: Francisco Austin MRN: [id number]  Date of Service: 04/13/2024 DOB: 28-Feb-1951  UV:[id number], Francisco Kalata, MD  Marlene Simas, MD   REFERRING PHYSICIAN: Marlene Simas, MD  DIAGNOSIS: 73 y/o man with enlarging RUL lung nodule suspicious for Stage IA NSCLC in patient with prior history of squamous cell carcinoma in LLL lung nodule and limited Small Cell Carcinoma in 11L node     ICD-10-CM   1. Malignant neoplasm of right upper lobe of lung (HCC)  C34.11       HISTORY OF PRESENT ILLNESS: Francisco Austin is a 73 y.o. male seen at the request of Dr. Marguerita Shih.  He is known to our service, previously treated with concurrent chemoradiation for limited small cell lung cancer involving the 11L node and synchronous NSCLC, squamous cell carcinoma in the LLL lung nodule diagnosed in May 2023.  He also received prophylactic cranial radiation after completion of his chemotherapy and had continued routine follow-up for observation under the care of of Dr. Liam Redhead.  On a recent follow-up CT chest scan from 01/25/2024, he was noted to have a 1.4 x 1.5 cm right upper lobe lung nodule that was slowly enlarging over time.  This was further evaluated with a PET scan on 02/08/2024 and this showed the previously treated LLL lung nodule to no longer be visible but confirmed hypermetabolism in the RUL lung nodule.  There is also a cystic lesion in the LUL lung.  He was referred back to Dr. Luna Salinas for bronchoscopy on 03/31/2024 for sampling of the RUL and LUL lung nodules.  Final pathology confirmed atypical cells in the RUL lung nodule but no malignancy or atypia seen in the sample from the LUL lung nodule.  His case was discussed in a recent multidisciplinary thoracic oncology conference and consensus recommendation was to proceed with stereotactic body radiotherapy (SBRT) to the RUL lung nodule and continue  monitoring the LUL lung nodule.    He has been kindly referred back to us  today to discuss treatment recommendations.  PREVIOUS RADIATION THERAPY: Yes  08/21/22 - 09/03/22:   The whole brain was treated to 25 Gy in 10 fractions of 2.5 Gy  for prophylactic cranial irradiation.   04/07/22 - 05/22/22:   The primary tumor in the LLL lung and involved mediastinal adenopathy were treated to 66 Gy in 33 fractions of 2 Gy, concurrent with chemotherapy.   PAST MEDICAL HISTORY:  Past Medical History:  Diagnosis Date   Agent orange exposure    Anxiety    Arthritis    Back pain    CAD (coronary artery disease)    Chronic chest pain    COPD (chronic obstructive pulmonary disease) (HCC)    Coronary vasospasm (HCC)    Headache    cluster   History of kidney stones    HLD (hyperlipidemia)    HLD (hyperlipidemia)    HTN (hypertension)    Lymphadenopathy, hilar    left   Mediastinal lymphadenopathy    Pre-diabetes    Small cell carcinoma (HCC)    Squamous cell carcinoma of lung (HCC)       PAST SURGICAL HISTORY: Past Surgical History:  Procedure Laterality Date   APPENDECTOMY     BRONCHIAL BIOPSY  03/31/2024   Procedure: BRONCHOSCOPY, WITH BIOPSY;  Surgeon: Zelphia Higashi, MD;  Location: [facility] ENDOSCOPY;  Service: Thoracic;;   BRONCHIAL NEEDLE ASPIRATION BIOPSY  03/31/2024  Procedure: BRONCHOSCOPY, WITH NEEDLE ASPIRATION BIOPSY;  Surgeon: Zelphia Higashi, MD;  Location: [facility] ENDOSCOPY;  Service: Thoracic;;   BRONCHIAL WASHINGS  03/31/2024   Procedure: IRRIGATION, BRONCHUS;  Surgeon: Zelphia Higashi, MD;  Location: [facility] ENDOSCOPY;  Service: Thoracic;;   CARDIAC CATHETERIZATION     multiple with Dr. Anastasia Balo (no stent placed at that time)   CORONARY STENT INTERVENTION N/A 12/24/2021   Procedure: CORONARY STENT INTERVENTION;  Surgeon: Lucendia Rusk, MD;  Location: [facility] INVASIVE CV LAB;  Service: Cardiovascular;  Laterality: N/A;   CORONARY ULTRASOUND/IVUS N/A 12/24/2021    Procedure: Intravascular Ultrasound/IVUS;  Surgeon: Lucendia Rusk, MD;  Location: [facility] INVASIVE CV LAB;  Service: Cardiovascular;  Laterality: N/A;   HEMORROIDECTOMY     LEFT HEART CATH AND CORONARY ANGIOGRAPHY N/A 12/23/2021   Procedure: LEFT HEART CATH AND CORONARY ANGIOGRAPHY;  Surgeon: Avanell Leigh, MD;  Location: [facility] INVASIVE CV LAB;  Service: Cardiovascular;  Laterality: N/A;   TONSILLECTOMY     VIDEO BRONCHOSCOPY WITH ENDOBRONCHIAL NAVIGATION N/A 03/31/2024   Procedure: VIDEO BRONCHOSCOPY WITH ENDOBRONCHIAL NAVIGATION;  Surgeon: Zelphia Higashi, MD;  Location: [facility] ENDOSCOPY;  Service: Thoracic;  Laterality: N/A;  robotic bronchoscopy for biopsy of right upper and left upper lobe nodules   VIDEO BRONCHOSCOPY WITH ENDOBRONCHIAL ULTRASOUND N/A 03/12/2022   Procedure: VIDEO BRONCHOSCOPY WITH ENDOBRONCHIAL ULTRASOUND;  Surgeon: Zelphia Higashi, MD;  Location: [facility] OR;  Service: Thoracic;  Laterality: N/A;    FAMILY HISTORY:  Family History  Problem Relation Age of Onset   Heart failure Mother    Cancer Father     SOCIAL HISTORY:  Social History   Socioeconomic History   Marital status: Married    Spouse name: Not on file   Number of children: 2   Years of education: Not on file   Highest education level: Not on file  Occupational History   Not on file  Tobacco Use   Smoking status: Every Day    Types: Pipe   Smokeless tobacco: Never  Vaping Use   Vaping status: Never Used  Substance and Sexual Activity   Alcohol use: Never   Drug use: Never   Sexual activity: Not Currently  Other Topics Concern   Not on file  Social History Narrative   Not on file   Social Drivers of Health   Financial Resource Strain: Not on file  Food Insecurity: No Food Insecurity (04/13/2024)   Hunger Vital Sign    Worried About Running Out of Food in the Last Year: Never true    Ran Out of Food in the Last Year: Never true  Transportation Needs: No Transportation Needs  (04/13/2024)   PRAPARE - Administrator, Civil Service (Medical): No    Lack of Transportation (Non-Medical): No  Physical Activity: Not on file  Stress: Not on file  Social Connections: Not on file  Intimate Partner Violence: Not At Risk (04/13/2024)   Humiliation, Afraid, Rape, and Kick questionnaire    Fear of Current or Ex-Partner: No    Emotionally Abused: No    Physically Abused: No    Sexually Abused: No    ALLERGIES: Gabapentin, Morphine, Other, Prednisone, Alprazolam, Fentanyl , Niacin, and Penicillamine  MEDICATIONS:  Current Outpatient Medications  Medication Sig Dispense Refill   Oxycodone HCl 10 MG TABS 1 tablet as needed Orally every 6 hrs for 30 days     albuterol  (VENTOLIN  HFA) 108 (90 Base) MCG/ACT inhaler Inhale 2 puffs into the lungs every 6 (  six) hours as needed for wheezing or shortness of breath.     aspirin  EC 81 MG tablet Take 81 mg by mouth daily.     atorvastatin  (LIPITOR ) 80 MG tablet Take 1 tablet (80 mg total) by mouth daily. 90 tablet 1   Cholecalciferol 50 MCG (2000 UT) TABS Take 2,000 Units by mouth daily.     clopidogrel  (PLAVIX ) 75 MG tablet TAKE ONE TABLET BY MOUTH ONCE DAILY WITH BREAKFAST 90 tablet 3   diclofenac sodium (VOLTAREN) 1 % GEL Apply 1 g topically daily as needed (pain).     diltiazem  (TIAZAC ) 240 MG 24 hr capsule Take 240 mg by mouth daily.     esomeprazole (NEXIUM) 40 MG capsule Take 40 mg by mouth daily.     HYDROcodone -acetaminophen  (NORCO) 10-325 MG tablet Take 1 tablet by mouth 5 (five) times daily.     LORazepam  (ATIVAN ) 1 MG tablet Take 1 mg by mouth See admin instructions. Take 1mg  (1 tablet) by mouth twice daily and 0.5mg  (1/2 tablet) in addition daily.     nitroGLYCERIN  (NITROSTAT ) 0.4 MG SL tablet Place 0.4 mg under the tongue every 5 (five) minutes as needed for chest pain.     telmisartan (MICARDIS) 80 MG tablet Take 80 mg by mouth daily.     umeclidinium-vilanterol (ANORO ELLIPTA) 62.5-25 MCG/INH AEPB Inhale 1  puff into the lungs daily.     vitamin B-12 (CYANOCOBALAMIN) 500 MCG tablet Take 500 mcg by mouth daily.     No current facility-administered medications for this encounter.   Facility-Administered Medications Ordered in Other Encounters  Medication Dose Route Frequency Provider Last Rate Last Admin   prochlorperazine  (COMPAZINE ) 10 MG/2ML injection             REVIEW OF SYSTEMS:  On review of systems, the patient reports that he is doing well overall.  He denies any chest pain, shortness of breath, cough, fevers, chills, night sweats, unintended weight changes.  He denies any bowel or bladder disturbances, and denies abdominal pain, nausea or vomiting.  He denies any new musculoskeletal or joint aches or pains. A complete review of systems is obtained and is otherwise negative.    PHYSICAL EXAM:  Wt Readings from Last 3 Encounters:  04/13/24 188 lb 12.8 oz (85.6 kg)  04/11/24 190 lb 8 oz (86.4 kg)  04/05/24 193 lb 12.8 oz (87.9 kg)   Temp Readings from Last 3 Encounters:  04/13/24 97.8 F (36.6 C)  04/11/24 98 F (36.7 C) (Tympanic)  03/31/24 98.2 F (36.8 C)   BP Readings from Last 3 Encounters:  04/13/24 104/66  04/11/24 107/72  04/05/24 108/70   Pulse Readings from Last 3 Encounters:  04/13/24 67  04/11/24 63  04/05/24 64   Pain Assessment Pain Score: 5  Pain Loc: Generalized/10  In general this is a well appearing Caucasian man in no acute distress.  He's alert and oriented x4 and appropriate throughout the examination. Cardiopulmonary assessment is negative for acute distress and he exhibits normal effort.   KPS = 100  100 - Normal; no complaints; no evidence of disease. 90   - Able to carry on normal activity; minor signs or symptoms of disease. 80   - Normal activity with effort; some signs or symptoms of disease. 46   - Cares for self; unable to carry on normal activity or to do active work. 60   - Requires occasional assistance, but is able to care for most  of his personal needs. 50   -  Requires considerable assistance and frequent medical care. 40   - Disabled; requires special care and assistance. 30   - Severely disabled; hospital admission is indicated although death not imminent. 20   - Very sick; hospital admission necessary; active supportive treatment necessary. 10   - Moribund; fatal processes progressing rapidly. 0     - Dead  Karnofsky DA, Abelmann WH, Craver LS and Burchenal [facility] [phone]) The use of the nitrogen mustards in the palliative treatment of carcinoma: with particular reference to bronchogenic carcinoma Cancer 1 634-56  LABORATORY DATA:  Lab Results  Component Value Date   WBC 5.9 04/11/2024   HGB 12.0 (L) 04/11/2024   HCT 35.4 (L) 04/11/2024   MCV 94.4 04/11/2024   PLT 292 04/11/2024   Lab Results  Component Value Date   NA 140 04/11/2024   K 3.7 04/11/2024   CL 106 04/11/2024   CO2 27 04/11/2024   Lab Results  Component Value Date   ALT 13 04/11/2024   AST 13 (L) 04/11/2024   ALKPHOS 79 04/11/2024   BILITOT 0.4 04/11/2024     RADIOGRAPHY: DG C-Arm 1-60 Min-No Report Result Date: 03/31/2024 Fluoroscopy was utilized by the requesting physician.  No radiographic interpretation.   DG C-ARM BRONCHOSCOPY Result Date: 03/31/2024 C-ARM BRONCHOSCOPY: Fluoroscopy was utilized by the requesting physician.  No radiographic interpretation.   DG Chest 2 View Result Date: 03/29/2024 CLINICAL DATA:  Preop chest exam.  Pulmonary nodules. EXAM: CHEST - 2 VIEW COMPARISON:  CT 01/25/2024 FINDINGS: The heart is normal in size. Left hilar prominence, may represent post radiation change. Right upper lobe nodule on prior CT is not well seen by radiograph. The lungs are hyperinflated with emphysema. No pneumothorax or pleural effusion. On limited assessment, no acute osseous findings. IMPRESSION: 1. Emphysema. 2. Left hilar prominence, may represent post radiation change. 3. Right upper lobe nodule on prior CT is not well seen by  radiograph. Electronically Signed   By: Chadwick Colonel M.D.   On: 03/29/2024 21:18   MYOCARDIAL PERFUSION/CT RAD READ Result Date: 03/28/2024 CLINICAL DATA:  This over-read does not include interpretation of cardiac or coronary anatomy or pathology. The cardiac SPECT CT interpretation by the cardiologist is attached. COMPARISON:  PET-CT February 08, 2024 and chest CT January 25, 2024 FINDINGS: Vascular: Aortic atherosclerosis. Lipomatous hypertrophy of the intra-atrial septum. Coronary artery calcifications/stents. Mediastinum/Nodes: Stable prominent mediastinal lymph nodes. Hilar structures not well evaluated on noncontrast enhanced examination. Lungs/Pleura: Similar left perihilar irregular consolidative opacities. Stable 11 mm irregular right upper lobe pulmonary nodule which was hypermetabolic on prior PET-CT. Upper Abdomen: Similar left adrenal thickening. Nonobstructive renal stones. Musculoskeletal: Multilevel degenerative change of the spine. IMPRESSION: 1. Similar left perihilar irregular consolidative opacities. 2. Stable 11 mm irregular right upper lobe pulmonary nodule which was hypermetabolic on prior PET-CT. 3. Stable prominent mediastinal lymph nodes. 4. Similar left adrenal thickening. 5. Nonobstructive renal stones. 6. Aortic atherosclerosis. Aortic Atherosclerosis (ICD10-I70.0). Electronically Signed   By: Tama Fails M.D.   On: 03/28/2024 12:57   MYOCARDIAL PERFUSION IMAGING Result Date: 03/25/2024   The study is normal. The study is low risk.   No ST deviation was noted.   LV perfusion is normal.   Left ventricular function is normal. Nuclear stress EF: 74%. The left ventricular ejection fraction is hyperdynamic (>65%). End diastolic cavity size is normal. End systolic cavity size is normal.   CT images were obtained for attenuation correction and were examined for the presence of coronary calcium  when appropriate.  Coronary calcium  assessment not performed due to prior revascularization.    Prior study not available for comparison.   Electronically signed by Luana Rumple, MD Low risk stress nuclear study with normal perfusion and normal left ventricular regional and global systolic function.     IMPRESSION/PLAN: 1. 73 y.o. man with enlarging RUL lung nodule suspicious for Stage IA NSCLC in patient with prior history of squamous cell carcinoma in LLL lung nodule and limited Small Cell Carcinoma in an 11L node   Today, we talked to the patient and his wife about the findings and workup thus far. We discussed the natural history of early-stage lung cancer and general treatment, highlighting the role of radiotherapy in the management. We discussed the available radiation techniques, and focused on the details and logistics of delivery. The recommendation is for a 3 fraction course of stereotactic body radiotherapy (SBRT) to the RUL lung nodule and continued close monitoring of the LUL lung nodule.  We reviewed the anticipated acute and late sequelae associated with radiation in this setting. The patient was encouraged to ask questions that were answered to his stated satisfaction and he is in agreement to proceed he is tentatively scheduled for CT simulation on Friday, 04/15/2024 so we will share our discussion with Dr. Liam Redhead and proceed with treatment planning accordingly, in anticipation of beginning his treatments in the near future.  We enjoyed meeting with him and his wife again today and look forward to continue to participate in his care.  We personally spent 70 minutes in this encounter including chart review, reviewing radiological studies, meeting face-to-face with the patient, entering orders, coordinating care and completing documentation.    Arta Bihari, PA-C    Kenith Payer, MD  [facility]  Radiation Oncology Direct Dial: [phone]  Fax: [phone] [email or web address]  Skype  LinkedIn

## 2024-04-15 ENCOUNTER — Ambulatory Visit
Admission: RE | Admit: 2024-04-15 | Discharge: 2024-04-15 | Disposition: A | Discharge status: Home / Self Care | Source: Ambulatory Visit | Attending: Radiation Oncology | Admitting: Radiation Oncology

## 2024-04-15 DIAGNOSIS — C3411 Malignant neoplasm of upper lobe, right bronchus or lung: Secondary | ICD-10-CM | POA: Insufficient documentation

## 2024-04-15 DIAGNOSIS — R911 Solitary pulmonary nodule: Secondary | ICD-10-CM

## 2024-04-15 DIAGNOSIS — Z510 Encounter for antineoplastic radiation therapy: Secondary | ICD-10-CM | POA: Diagnosis not present

## 2024-04-15 DIAGNOSIS — F17210 Nicotine dependence, cigarettes, uncomplicated: Secondary | ICD-10-CM | POA: Diagnosis not present

## 2024-04-15 NOTE — Progress Notes (Signed)
  Radiation Oncology         [phone] ________________________________  Name: Francisco Austin MRN: [id number]  Date: 04/15/2024  DOB: May 29, 1951  STEREOTACTIC BODY RADIOTHERAPY SIMULATION AND TREATMENT PLANNING NOTE    ICD-10-CM   1. Malignant neoplasm of right upper lobe of lung (HCC)  C34.11     2. Nodule of upper lobe of right lung  R91.1       DIAGNOSIS:  73 y/o man with enlarging RUL lung nodule suspicious for Stage IA NSCLC in patient with prior history of squamous cell carcinoma in LLL lung nodule and limited Small Cell Carcinoma in 11L node   NARRATIVE:  The patient was brought to the CT Simulation planning suite.  Identity was confirmed.  All relevant records and images related to the planned course of therapy were reviewed.  The patient freely provided informed written consent to proceed with treatment after reviewing the details related to the planned course of therapy. The consent form was witnessed and verified by the simulation staff.  Then, the patient was set-up in a stable reproducible  supine position for radiation therapy.  A BodyFix immobilization pillow was fabricated for reproducible positioning.  Then I personally applied the abdominal compression paddle to limit respiratory excursion.  4D respiratoy motion management CT images were obtained.  Surface markings were placed.  The CT images were loaded into the planning software.  Then, using Cine, MIP, and standard views, the internal target volume (ITV) and planning target volumes (PTV) were delinieated, and avoidance structures were contoured.  Treatment planning then occurred.  The radiation prescription was entered and confirmed.  A total of two complex treatment devices were fabricated in the form of the BodyFix immobilization pillow and a neck accuform cushion.  I have requested : 3D Simulation  I have requested a DVH of the following structures: Heart, Lungs, Esophagus, Chest Wall, Brachial Plexus, Major Blood Vessels,  and targets.  SPECIAL TREATMENT PROCEDURE:  The planned course of therapy using radiation constitutes a special treatment procedure. Special care is required in the management of this patient for the following reasons. This treatment constitutes a Special Treatment Procedure for the following reason: [ High dose per fraction requiring special monitoring for increased toxicities of treatment including daily imaging..  The special nature of the planned course of radiotherapy will require increased physician supervision and oversight to ensure patient's safety with optimal treatment outcomes.  This requires extended time and effort.    RESPIRATORY MOTION MANAGEMENT SIMULATION:  In order to account for effect of respiratory motion on target structures and other organs in the planning and delivery of radiotherapy, this patient underwent respiratory motion management simulation.  To accomplish this, when the patient was brought to the CT simulation planning suite, 4D respiratoy motion management CT images were obtained.  The CT images were loaded into the planning software.  Then, using a variety of tools including Cine, MIP, and standard views, the target volume and planning target volumes (PTV) were delineated.  Avoidance structures were contoured.  Treatment planning then occurred.  Dose volume histograms were generated and reviewed for each of the requested structure.  The resulting plan was carefully reviewed and approved today.  PLAN:  The target in the RUL lung will receive 54 Gy in 3 fractions of 18 Gy each.  ________________________________  Trilby Fujisawa. Lorri Rota, M.D.

## 2024-04-19 DIAGNOSIS — C3411 Malignant neoplasm of upper lobe, right bronchus or lung: Secondary | ICD-10-CM | POA: Diagnosis not present

## 2024-04-19 DIAGNOSIS — Z510 Encounter for antineoplastic radiation therapy: Secondary | ICD-10-CM | POA: Diagnosis not present

## 2024-04-19 DIAGNOSIS — F17210 Nicotine dependence, cigarettes, uncomplicated: Secondary | ICD-10-CM | POA: Diagnosis not present

## 2024-04-27 ENCOUNTER — Ambulatory Visit
Admission: RE | Admit: 2024-04-27 | Discharge: 2024-04-27 | Disposition: A | Discharge status: Home / Self Care | Source: Ambulatory Visit | Attending: Radiation Oncology | Admitting: Radiation Oncology

## 2024-04-27 ENCOUNTER — Other Ambulatory Visit: Payer: Self-pay

## 2024-04-27 DIAGNOSIS — C3411 Malignant neoplasm of upper lobe, right bronchus or lung: Secondary | ICD-10-CM | POA: Diagnosis not present

## 2024-04-27 DIAGNOSIS — Z510 Encounter for antineoplastic radiation therapy: Secondary | ICD-10-CM | POA: Diagnosis not present

## 2024-04-27 LAB — RAD ONC ARIA SESSION SUMMARY
Course Elapsed Days: 0
Plan Fractions Treated to Date: 1
Plan Prescribed Dose Per Fraction: 18 Gy
Plan Total Fractions Prescribed: 3
Plan Total Prescribed Dose: 54 Gy
Reference Point Dosage Given to Date: 18 Gy
Reference Point Session Dosage Given: 18 Gy
Session Number: 1

## 2024-04-28 ENCOUNTER — Ambulatory Visit

## 2024-04-29 ENCOUNTER — Other Ambulatory Visit: Payer: Self-pay

## 2024-04-29 ENCOUNTER — Ambulatory Visit
Admission: RE | Admit: 2024-04-29 | Discharge: 2024-04-29 | Disposition: A | Discharge status: Home / Self Care | Source: Ambulatory Visit | Attending: Radiation Oncology

## 2024-04-29 DIAGNOSIS — C3411 Malignant neoplasm of upper lobe, right bronchus or lung: Secondary | ICD-10-CM | POA: Diagnosis not present

## 2024-04-29 DIAGNOSIS — Z510 Encounter for antineoplastic radiation therapy: Secondary | ICD-10-CM | POA: Diagnosis not present

## 2024-04-29 LAB — RAD ONC ARIA SESSION SUMMARY
Course Elapsed Days: 2
Plan Fractions Treated to Date: 2
Plan Prescribed Dose Per Fraction: 18 Gy
Plan Total Fractions Prescribed: 3
Plan Total Prescribed Dose: 54 Gy
Reference Point Dosage Given to Date: 36 Gy
Reference Point Session Dosage Given: 18 Gy
Session Number: 2

## 2024-05-02 ENCOUNTER — Ambulatory Visit

## 2024-05-03 ENCOUNTER — Other Ambulatory Visit: Payer: Self-pay

## 2024-05-03 ENCOUNTER — Ambulatory Visit
Admission: RE | Admit: 2024-05-03 | Discharge: 2024-05-03 | Disposition: A | Discharge status: Home / Self Care | Source: Ambulatory Visit | Attending: Radiation Oncology | Admitting: Radiation Oncology

## 2024-05-03 ENCOUNTER — Ambulatory Visit

## 2024-05-03 DIAGNOSIS — F17210 Nicotine dependence, cigarettes, uncomplicated: Secondary | ICD-10-CM | POA: Diagnosis not present

## 2024-05-03 DIAGNOSIS — Z510 Encounter for antineoplastic radiation therapy: Secondary | ICD-10-CM | POA: Insufficient documentation

## 2024-05-03 DIAGNOSIS — C3411 Malignant neoplasm of upper lobe, right bronchus or lung: Secondary | ICD-10-CM | POA: Insufficient documentation

## 2024-05-03 LAB — RAD ONC ARIA SESSION SUMMARY
Course Elapsed Days: 6
Plan Fractions Treated to Date: 3
Plan Prescribed Dose Per Fraction: 18 Gy
Plan Total Fractions Prescribed: 3
Plan Total Prescribed Dose: 54 Gy
Reference Point Dosage Given to Date: 54 Gy
Reference Point Session Dosage Given: 18 Gy
Session Number: 3

## 2024-05-04 ENCOUNTER — Ambulatory Visit

## 2024-05-04 NOTE — Radiation Completion Notes (Addendum)
  Radiation Oncology         [phone] ________________________________  Name: Francisco Austin MRN: [id number]  Date: 05/03/2024  DOB: 12/31/1950  Referring Physician: SHERROD SHERROD, M.D. Date of Service: 2024-05-04 Radiation Oncologist: Adina Barge, M.D. [facility]     RADIATION ONCOLOGY END OF TREATMENT NOTE     Diagnosis: 73 y/o man with enlarging RUL lung nodule suspicious for Stage IA NSCLC in patient with prior history of squamous cell carcinoma in LLL lung nodule and limited Small Cell Carcinoma in 11L node   Intent: Curative     ==========DELIVERED PLANS==========  First Treatment Date: 2024-04-27 Last Treatment Date: 2024-05-03   Plan Name: Lung_R_SBRT Site: Lung, Right Technique: SBRT/SRT-IMRT Mode: Photon Dose Per Fraction: 18 Gy Prescribed Dose (Delivered / Prescribed): 54 Gy / 54 Gy Prescribed Fxs (Delivered / Prescribed): 3 / 3     ==========ON TREATMENT VISIT DATES========== 2024-04-27, 2024-04-29, 2024-05-03, 2024-05-03    See weekly On Treatment Notes in Epic for details in the Media tab (listed as Progress notes on the On Treatment Visit Dates listed above).  He tolerated the treatments well with only modest fatigue.   The patient will receive a call in about one month from the radiation oncology department. He will continue follow up with his medical oncologist, Dr. SHERROD, as well.  ------------------------------------------------   Donnice Barge, MD [facility]  Radiation Oncology Direct Dial: [phone]  Fax: [phone] [email or web address]  Skype  LinkedIn

## 2024-05-05 ENCOUNTER — Ambulatory Visit

## 2024-05-06 ENCOUNTER — Ambulatory Visit

## 2024-05-09 ENCOUNTER — Ambulatory Visit

## 2024-05-10 DIAGNOSIS — I10 Essential (primary) hypertension: Secondary | ICD-10-CM | POA: Diagnosis not present

## 2024-05-10 DIAGNOSIS — G894 Chronic pain syndrome: Secondary | ICD-10-CM | POA: Diagnosis not present

## 2024-05-10 DIAGNOSIS — E7800 Pure hypercholesterolemia, unspecified: Secondary | ICD-10-CM | POA: Diagnosis not present

## 2024-05-10 DIAGNOSIS — E1169 Type 2 diabetes mellitus with other specified complication: Secondary | ICD-10-CM | POA: Diagnosis not present

## 2024-06-07 ENCOUNTER — Telehealth: Payer: Self-pay | Admitting: Medical Oncology

## 2024-06-07 ENCOUNTER — Ambulatory Visit
Admission: RE | Admit: 2024-06-07 | Discharge: 2024-06-07 | Disposition: A | Discharge status: Home / Self Care | Source: Ambulatory Visit | Attending: Radiation Oncology | Admitting: Radiation Oncology

## 2024-06-07 NOTE — Telephone Encounter (Signed)
  Patient was reported to have intermittent lightheadedness and memory issues per [place] during his 1 month f/u phone call today.  Spoke with patient via phone. He stated, "I've been feeling like this since completing lung radiation therapy and prophylactic brain irradiation."  Patient also reported experiencing occasional sharp chest pain and shortness of breath (SOB) with exertion. He described the pain as occurring in both the right and left thorax. He stated that the SOB resolves quickly after exertion.  I told pt to monitor his symptoms for now. If your chest pain and sob persists for a few more days or symptoms worsen please contact  your Primary care doctor. ED precautions given .

## 2024-06-07 NOTE — Progress Notes (Signed)
 Maalik E Wiedemann had post treatment call for follow up post radiation  to the lung. Patients identity was verified  Lung Side: SBRT to  Right lung, patient completed treatment on 05/03/24.   Does the patient complain of any of the following: Pain: Reports mild patient to right chest.  Shortness of breath w/wo exertion: Yes, mostly on exertion.  Cough: Yes, productive- Hemoptysis: No Pain with swallowing: No Swallowing/choking concerns: No Appetite: Good  Energy Level: Low Post radiation skin Changes: No Follow up with Med onc: Dr. Gatha 08/15/24.     Additional comments if applicable: Patient reports having some lightheadedness when up walking around, also reports issues with memory.

## 2024-06-07 NOTE — Progress Notes (Signed)
 Francisco Austin had post treatment call for follow up post radiation  to the lung. Patients identity was verified  Lung Side: SBRT to  Right lung, patient completed treatment on 05/03/24.   Does the patient complain of any of the following: Pain: Reports mild patient to right chest.  Shortness of breath w/wo exertion: Yes, mostly on exertion.  Cough: Yes, productive- Hemoptysis: No Pain with swallowing: No Swallowing/choking concerns: No Appetite: Good  Energy Level: Low Post radiation skin Changes: No Follow up with Med onc: Dr. Gatha 08/15/24.     Additional comments if applicable: Patient reports having some lightheadedness when up walking around, also reports issues with memory.

## 2024-07-12 NOTE — Progress Notes (Signed)
 Cardiology Office Note    Patient Name: Francisco Austin Date of Encounter: 07/12/2024  Primary Care Provider:  Arloa Elsie SAUNDERS, MD Primary Cardiologist:  Dorn Lesches, MD Primary Electrophysiologist: None   Past Medical History    Past Medical History:  Diagnosis Date   Agent orange exposure    Anxiety    Arthritis    Back pain    CAD (coronary artery disease)    Chronic chest pain    COPD (chronic obstructive pulmonary disease) (HCC)    Coronary vasospasm (HCC)    Headache    cluster   History of kidney stones    HLD (hyperlipidemia)    HLD (hyperlipidemia)    HTN (hypertension)    Lymphadenopathy, hilar    left   Mediastinal lymphadenopathy    Pre-diabetes    Small cell carcinoma (HCC)    Squamous cell carcinoma of lung (HCC)     History of Present Illness  Francisco Austin is a 73 y.o. male with a PMH of CAD s/p s/p DES x2 to RCA and DES x1 to LAD in 12/2021 , HLD, HTN, small cell lung CA 2023 treated with XRT and radiation, COPD, prediabetes, tobacco abuse who presents today for annual follow-up.  [name] was followed initially by Dr. Blanca and is currently followed by Dr. Wadie for management of CAD.  Coronary CTA in 2023 due to atypical chest pain that showed a calcium  score of 1970 and was evaluated by [facility] on 12/23/2021.  Showed Enderson stenosis of proximal LAD and 80% stenosis of proximal mid RCA that was treated with DES to the LAD and orbital arthrectomy and overlapping stents to the proximal mid RCA.  He was diagnosed with lung cancer in 2023 and treated with XRT and radiation.  He was seen in follow-up on 02/2024 with complaint of chest pain and completed a Lexiscan  Myoview  that was low risk and normal.  He recently underwent PET scan for surveillance that showed irregularities in the right upper lung lobe.  He was referred to pulmonology for biopsy which was completed on 03/31/2024 with confirmed malignancy and recommendation of radiation therapy.  He completed  treatment on 05/03/2024.  He reported occasional sharp chest pain and shortness of breath with exertion who presents today for evaluation.  [name] presents today with his wife for complaint of chest pain. He has been experiencing recurrent chest pain over the past three to four weeks, described as similar to previous episodes but more intense. The pain is a pressing sensation, non-radiating, and is exacerbated by physical activity such as playing golf, where he also experiences fatigue and weakness. Nitroglycerin  alleviates the pain but causes headaches. He occasionally experiences shortness of breath with the chest pain. He takes nitroglycerin  as needed, sometimes using 15 to 20 tablets in a month, but can also go months without needing any. He is on aspirin , Plavix , and Lipitor . He has a history of stent placement in 2023 and underwent a Lexiscan  stress test in April, which was normal. Despite this, he continues to experience similar discomfort. He underwent radiation therapy in June, and the pain has not increased in intensity since then. He associates the onset of more intense pain with a double biopsy procedure, which revealed cancer. He experiences swelling in his feet and legs, which does not subside by the end of the day but sometimes decreases in the morning. He is currently taking Cardizem  (Tiazac ) 240 mg, which he believes may contribute to the swelling. He frequently  feels cold, which he attributes to potential anemia from chemotherapy and radiation therapy. His hemoglobin was 12 three months ago, and he has an upcoming appointment for lab work and a follow-up with his primary doctor. Patient denies palpitations, dyspnea, PND, orthopnea, nausea, vomiting, dizziness, syncope, edema, weight gain, or early satiety.  Discussed the use of AI scribe software for clinical note transcription with the patient, who gave verbal consent to proceed.  History of Present Illness   Review of Systems  Please  see the history of present illness.    All other systems reviewed and are otherwise negative except as noted above.  Physical Exam    Wt Readings from Last 3 Encounters:  04/13/24 188 lb 12.8 oz (85.6 kg)  04/11/24 190 lb 8 oz (86.4 kg)  04/05/24 193 lb 12.8 oz (87.9 kg)   CD:Uyzmz were no vitals filed for this visit.,There is no height or weight on file to calculate BMI. GEN: Well nourished, well developed in no acute distress Neck: No JVD; No carotid bruits Pulmonary: Clear to auscultation without rales, wheezing or rhonchi  Cardiovascular: Normal rate. Regular rhythm. Normal S1. Normal S2.   Murmurs: There is no murmur.  ABDOMEN: Soft, non-tender, non-distended EXTREMITIES: +1 bilateral lower extremity edema  EKG/LABS/ Recent Cardiac Studies   ECG personally reviewed by me today -sinus rhythm with PACs and rate of 65 bpm with no acute changes consistent with previous EKG.  Risk Assessment/Calculations:          Lab Results  Component Value Date   WBC 5.9 04/11/2024   HGB 12.0 (L) 04/11/2024   HCT 35.4 (L) 04/11/2024   MCV 94.4 04/11/2024   PLT 292 04/11/2024   Lab Results  Component Value Date   CREATININE 0.81 04/11/2024   BUN 7 (L) 04/11/2024   NA 140 04/11/2024   K 3.7 04/11/2024   CL 106 04/11/2024   CO2 27 04/11/2024   Lab Results  Component Value Date   CHOL 115 03/03/2022   HDL 40 03/03/2022   LDLCALC 61 03/03/2022   TRIG 67 03/03/2022   CHOLHDL 2.9 03/03/2022    No results found for: HGBA1C Assessment & Plan    Assessment and Plan Assessment & Plan   1.  Coronary artery disease: -s/p DES x2 to RCA and DES x1 to LAD in 12/2021 with recurrent angina with increased intensity, relieved by nitroglycerin , suggestive of coronary vasospasm. -Previous Lexiscan  Myoview  completed 02/2024 that was low risk and normal. - Case discussed with Dr. Verlin DOD with agreement for recommendation of repeat heart catheterization. - CBC and BMET - We will initiate  ranolazine  500 mg twice daily for coronary spasm and chest pain - Continue current GDMT with ASA 81 mg, Lipitor  80 mg, Plavix  75 mg - ED precautions discussed and advised for resistant chest pain that is not relieved with as needed nitroglycerin  or ranolazine   2.  Essential hypertension: - Patient's blood pressure today was stable at 100/62 - Continue Micardis 80 mg and Cardizem  240 mg  3.  Hyperlipidemia -Patient's last LDL cholesterol was at goal 59 - Continue Lipitor  80 mg daily  4.  History of small cell lung CA: -s/p radiation therapy - Continue current treatment plan per oncology  5. Anemia likely secondary to chemotherapy: -Anemia likely secondary to recent radiation therapy previous hemoglobin was 12, indicating some anemia. - Order CBC to assess current blood counts. - Follow up with oncologist, Dr. Deatrice, on October 13th as scheduled  Disposition: Follow-up with Dorn  Court, MD or APP in 2 months Informed Consent   Shared Decision Making/Informed Consent The risks [stroke (1 in 1000), death (1 in 1000), kidney failure [usually temporary] (1 in 500), bleeding (1 in 200), allergic reaction [possibly serious] (1 in 200)], benefits (diagnostic support and management of coronary artery disease) and alternatives of a cardiac catheterization were discussed in detail with Mr. Buchberger and he is willing to proceed.      Signed, Wyn Raddle, Jackee Shove, NP 07/12/2024, 2:10 PM [facility]

## 2024-07-12 NOTE — H&P (View-Only) (Signed)
 Cardiology Office Note    Patient Name: Francisco Austin Date of Encounter: 07/12/2024  Primary Care Provider:  Arloa Elsie SAUNDERS, MD Primary Cardiologist:  Francisco Lesches, MD Primary Electrophysiologist: None   Past Medical History    Past Medical History:  Diagnosis Date   Agent orange exposure    Anxiety    Arthritis    Back pain    CAD (coronary artery disease)    Chronic chest pain    COPD (chronic obstructive pulmonary disease) (HCC)    Coronary vasospasm (HCC)    Headache    cluster   History of kidney stones    HLD (hyperlipidemia)    HLD (hyperlipidemia)    HTN (hypertension)    Lymphadenopathy, hilar    left   Mediastinal lymphadenopathy    Pre-diabetes    Small cell carcinoma (HCC)    Squamous cell carcinoma of lung (HCC)     History of Present Illness  Francisco Austin is a 73 y.o. male with a PMH of CAD s/p s/p DES x2 to RCA and DES x1 to LAD in 12/2021 , HLD, HTN, small cell lung CA 2023 treated with XRT and radiation, COPD, prediabetes, tobacco abuse who presents today for annual follow-up.  Francisco Austin was followed initially by Dr. Blanca and is currently followed by Dr. Wadie for management of CAD.  Coronary CTA in 2023 due to atypical chest pain that showed a calcium  score of 1970 and was evaluated by [facility] on 12/23/2021.  Showed Enderson stenosis of proximal LAD and 80% stenosis of proximal mid RCA that was treated with DES to the LAD and orbital arthrectomy and overlapping stents to the proximal mid RCA.  He was diagnosed with lung cancer in 2023 and treated with XRT and radiation.  He was seen in follow-up on 02/2024 with complaint of chest pain and completed a Lexiscan  Myoview  that was low risk and normal.  He recently underwent PET scan for surveillance that showed irregularities in the right upper lung lobe.  He was referred to pulmonology for biopsy which was completed on 03/31/2024 with confirmed malignancy and recommendation of radiation therapy.  He completed  treatment on 05/03/2024.  He reported occasional sharp chest pain and shortness of breath with exertion who presents today for evaluation.  Francisco Austin presents today with his wife for complaint of chest pain. He has been experiencing recurrent chest pain over the past three to four weeks, described as similar to previous episodes but more intense. The pain is a pressing sensation, non-radiating, and is exacerbated by physical activity such as playing golf, where he also experiences fatigue and weakness. Nitroglycerin  alleviates the pain but causes headaches. He occasionally experiences shortness of breath with the chest pain. He takes nitroglycerin  as needed, sometimes using 15 to 20 tablets in a month, but can also go months without needing any. He is on aspirin , Plavix , and Lipitor . He has a history of stent placement in 2023 and underwent a Lexiscan  stress test in April, which was normal. Despite this, he continues to experience similar discomfort. He underwent radiation therapy in June, and the pain has not increased in intensity since then. He associates the onset of more intense pain with a double biopsy procedure, which revealed cancer. He experiences swelling in his feet and legs, which does not subside by the end of the day but sometimes decreases in the morning. He is currently taking Cardizem  (Tiazac ) 240 mg, which he believes may contribute to the swelling. He frequently  feels cold, which he attributes to potential anemia from chemotherapy and radiation therapy. His hemoglobin was 12 three months ago, and he has an upcoming appointment for lab work and a follow-up with his primary doctor. Patient denies palpitations, dyspnea, PND, orthopnea, nausea, vomiting, dizziness, syncope, edema, weight gain, or early satiety.  Discussed the use of AI scribe software for clinical note transcription with the patient, who gave verbal consent to proceed.  History of Present Illness   Review of Systems  Please  see the history of present illness.    All other systems reviewed and are otherwise negative except as noted above.  Physical Exam    Wt Readings from Last 3 Encounters:  04/13/24 188 lb 12.8 oz (85.6 kg)  04/11/24 190 lb 8 oz (86.4 kg)  04/05/24 193 lb 12.8 oz (87.9 kg)   CD:Uyzmz were no vitals filed for this visit.,There is no height or weight on file to calculate BMI. GEN: Well nourished, well developed in no acute distress Neck: No JVD; No carotid bruits Pulmonary: Clear to auscultation without rales, wheezing or rhonchi  Cardiovascular: Normal rate. Regular rhythm. Normal S1. Normal S2.   Murmurs: There is no murmur.  ABDOMEN: Soft, non-tender, non-distended EXTREMITIES: +1 bilateral lower extremity edema  EKG/LABS/ Recent Cardiac Studies   ECG personally reviewed by me today -sinus rhythm with PACs and rate of 65 bpm with no acute changes consistent with previous EKG.  Risk Assessment/Calculations:          Lab Results  Component Value Date   WBC 5.9 04/11/2024   HGB 12.0 (L) 04/11/2024   HCT 35.4 (L) 04/11/2024   MCV 94.4 04/11/2024   PLT 292 04/11/2024   Lab Results  Component Value Date   CREATININE 0.81 04/11/2024   BUN 7 (L) 04/11/2024   NA 140 04/11/2024   K 3.7 04/11/2024   CL 106 04/11/2024   CO2 27 04/11/2024   Lab Results  Component Value Date   CHOL 115 03/03/2022   HDL 40 03/03/2022   LDLCALC 61 03/03/2022   TRIG 67 03/03/2022   CHOLHDL 2.9 03/03/2022    No results found for: HGBA1C Assessment & Plan    Assessment and Plan Assessment & Plan   1.  Coronary artery disease: -s/p DES x2 to RCA and DES x1 to LAD in 12/2021 with recurrent angina with increased intensity, relieved by nitroglycerin , suggestive of coronary vasospasm. -Previous Lexiscan  Myoview  completed 02/2024 that was low risk and normal. - Case discussed with Dr. Verlin Austin with agreement for recommendation of repeat heart catheterization. - CBC and BMET - We will initiate  ranolazine  500 mg twice daily for coronary spasm and chest pain - Continue current GDMT with ASA 81 mg, Lipitor  80 mg, Plavix  75 mg - ED precautions discussed and advised for resistant chest pain that is not relieved with as needed nitroglycerin  or ranolazine   2.  Essential hypertension: - Patient's blood pressure today was stable at 100/62 - Continue Micardis 80 mg and Cardizem  240 mg  3.  Hyperlipidemia -Patient's last LDL cholesterol was at goal 59 - Continue Lipitor  80 mg daily  4.  History of small cell lung CA: -s/p radiation therapy - Continue current treatment plan per oncology  5. Anemia likely secondary to chemotherapy: -Anemia likely secondary to recent radiation therapy previous hemoglobin was 12, indicating some anemia. - Order CBC to assess current blood counts. - Follow up with oncologist, Dr. Deatrice, on October 13th as scheduled  Disposition: Follow-up with Francisco  Court, MD or APP in 2 months Informed Consent   Shared Decision Making/Informed Consent The risks [stroke (1 in 1000), death (1 in 1000), kidney failure [usually temporary] (1 in 500), bleeding (1 in 200), allergic reaction [possibly serious] (1 in 200)], benefits (diagnostic support and management of coronary artery disease) and alternatives of a cardiac catheterization were discussed in detail with Mr. Buchberger and he is willing to proceed.      Signed, Wyn Raddle, Jackee Shove, NP 07/12/2024, 2:10 PM [facility]

## 2024-07-13 ENCOUNTER — Ambulatory Visit: Attending: Nurse Practitioner | Admitting: Nurse Practitioner

## 2024-07-13 ENCOUNTER — Encounter: Payer: Self-pay | Admitting: Nurse Practitioner

## 2024-07-13 VITALS — BP 100/62 | HR 68 | Resp 16 | Ht 70.0 in | Wt 194.2 lb

## 2024-07-13 DIAGNOSIS — I2510 Atherosclerotic heart disease of native coronary artery without angina pectoris: Secondary | ICD-10-CM

## 2024-07-13 DIAGNOSIS — C3411 Malignant neoplasm of upper lobe, right bronchus or lung: Secondary | ICD-10-CM | POA: Diagnosis not present

## 2024-07-13 DIAGNOSIS — D649 Anemia, unspecified: Secondary | ICD-10-CM

## 2024-07-13 DIAGNOSIS — E785 Hyperlipidemia, unspecified: Secondary | ICD-10-CM

## 2024-07-13 DIAGNOSIS — I10 Essential (primary) hypertension: Secondary | ICD-10-CM

## 2024-07-13 NOTE — Patient Instructions (Addendum)
 Medication Instructions:  Your physician recommends that you continue on your current medications as directed. Please refer to the Current Medication list given to you today. *If you need a refill on your cardiac medications before your next appointment, please call your pharmacy*  Lab Work: [facility] & BMET If you have labs (blood work) drawn today and your tests are completely normal, you will receive your results only by: MyChart Message (if you have MyChart) OR A paper copy in the mail If you have any lab test that is abnormal or we need to change your treatment, we will call you to review the results.  Testing/Procedures: Your physician has requested that you have a cardiac catheterization. Cardiac catheterization is used to diagnose and/or treat various heart conditions. Doctors may recommend this procedure for a number of different reasons. The most common reason is to evaluate chest pain. Chest pain can be a symptom of coronary artery disease (CAD), and cardiac catheterization can show whether plaque is narrowing or blocking your heart's arteries. This procedure is also used to evaluate the valves, as well as measure the blood flow and oxygen levels in different parts of your heart. For further information please visit [email or web address] Please follow instruction sheet, as given.  Follow-Up: At [facility], you and your health needs are our priority.  As part of our continuing mission to provide you with exceptional heart care, our providers are all part of one team.  This team includes your primary Cardiologist (physician) and Advanced Practice Providers or APPs (Physician Assistants and Nurse Practitioners) who all work together to provide you with the care you need, when you need it.  Your next appointment:   2 month(s)  Provider:   Dorn Lesches, MD or ANY APP   We recommend signing up for the patient portal called MyChart.  Sign up information is provided on this  After Visit Summary.  MyChart is used to connect with patients for Virtual Visits (Telemedicine).  Patients are able to view lab/test results, encounter notes, upcoming appointments, etc.  Non-urgent messages can be sent to your provider as well.   To learn more about what you can do with MyChart, go to [email or web address].   Other Instructions   [facility] A DEPT OF [place]. [facility] AT [address] A DEPT OF THE [place]. [facility] [address] [place] [address] Dept: [phone] Loc: [phone]  Francisco Austin  07/13/2024  You are scheduled for a Cardiac Catheterization on Friday, September 12 with Dr. Alm Clay.  1. Please arrive at the [facility] (Main Entrance A) at [facility]: [address] [place], [place] [address] at 10:00 AM (This time is 2 hour(s) before your procedure to ensure your preparation).   Free valet parking service is available. You will check in at ADMITTING. The support person will be asked to wait in the waiting room.  It is OK to have someone drop you off and come back when you are ready to be discharged.    Special note: Every effort is made to have your procedure done on time. Please understand that emergencies sometimes delay scheduled procedures.  2. Diet: Nothing to eat after midnight.   3. Hydration: You need to be well hydrated before your procedure. On September 12, you may drink approved liquids (see below) until 2 hours before the procedure, with 16 oz of water  as your last intake.   List of approved liquids water , clear  juice, clear tea, black coffee, fruit juices, non-citric and without pulp, carbonated beverages, Gatorade, Kool -Aid, plain Jello-O and plain ice popsicles.  4. Labs: You will need to have blood drawn on Wednesday, September 10 at [facility] (1st Floor), [address] [place], [place] [address]. You do not need to be  fasting.  5. Medication instructions in preparation for your procedure:   Contrast Allergy: No    Stop taking, Micardis (Telmisartan) Friday, September 12,   On the morning of your procedure, take your Aspirin  81 mg and Plavix /Clopidogrel  and any morning medicines NOT listed above.  You may use sips of water .  6. Plan to go home the same day, you will only stay overnight if medically necessary. 7. Bring a current list of your medications and current insurance cards. 8. You MUST have a responsible person to drive you home. 9. Someone MUST be with you the first 24 hours after you arrive home or your discharge will be delayed. 10. Please wear clothes that are easy to get on and off and wear slip-on shoes.  Thank you for allowing us  to care for you!   -- [facility]

## 2024-07-14 ENCOUNTER — Ambulatory Visit: Payer: Self-pay | Admitting: Nurse Practitioner

## 2024-07-14 LAB — CBC
Hematocrit: 38.1 % (ref 37.5–51.0)
Hemoglobin: 12.6 g/dL — ABNORMAL LOW (ref 13.0–17.7)
MCH: 32.7 pg (ref 26.6–33.0)
MCHC: 33.1 g/dL (ref 31.5–35.7)
MCV: 99 fL — ABNORMAL HIGH (ref 79–97)
Platelets: 296 x10E3/uL (ref 150–450)
RBC: 3.85 x10E6/uL — ABNORMAL LOW (ref 4.14–5.80)
RDW: 14.1 % (ref 11.6–15.4)
WBC: 5.8 x10E3/uL (ref 3.4–10.8)

## 2024-07-14 LAB — BASIC METABOLIC PANEL WITH GFR
BUN/Creatinine Ratio: 10 (ref 10–24)
BUN: 9 mg/dL (ref 8–27)
CO2: 18 mmol/L — AB (ref 20–29)
Calcium: 9 mg/dL (ref 8.6–10.2)
Chloride: 103 mmol/L (ref 96–106)
Creatinine, Ser: 0.88 mg/dL (ref 0.76–1.27)
Glucose: 95 mg/dL (ref 70–99)
Potassium: 4.5 mmol/L (ref 3.5–5.2)
Sodium: 142 mmol/L (ref 134–144)
eGFR: 91 mL/min/1.73 (ref >59)

## 2024-07-14 MED ORDER — RANOLAZINE ER 500 MG PO TB12: 500.0000 mg | ORAL_TABLET | Freq: Two times a day (BID) | ORAL | 1 refills | Status: DC

## 2024-07-14 NOTE — Addendum Note (Signed)
 Addended by: SEBASTIAN JANESE GRADE on: 07/14/2024 05:16 PM   Modules accepted: Orders

## 2024-07-15 ENCOUNTER — Other Ambulatory Visit: Payer: Self-pay

## 2024-07-15 ENCOUNTER — Ambulatory Visit (HOSPITAL_COMMUNITY)
Admission: RE | Admit: 2024-07-15 | Discharge: 2024-07-15 | Disposition: A | Discharge status: Home / Self Care | Attending: Cardiology | Admitting: Cardiology

## 2024-07-15 ENCOUNTER — Encounter (HOSPITAL_COMMUNITY)
Admission: RE | Disposition: A | Discharge status: Home / Self Care | Payer: Self-pay | Source: Home / Self Care | Attending: Cardiology

## 2024-07-15 DIAGNOSIS — R7303 Prediabetes: Secondary | ICD-10-CM | POA: Diagnosis not present

## 2024-07-15 DIAGNOSIS — I10 Essential (primary) hypertension: Secondary | ICD-10-CM | POA: Insufficient documentation

## 2024-07-15 DIAGNOSIS — I2584 Coronary atherosclerosis due to calcified coronary lesion: Secondary | ICD-10-CM | POA: Diagnosis not present

## 2024-07-15 DIAGNOSIS — Z923 Personal history of irradiation: Secondary | ICD-10-CM | POA: Diagnosis not present

## 2024-07-15 DIAGNOSIS — I25112 Atherosclerotic heart disease of native coronary artery with refractory angina pectoris: Secondary | ICD-10-CM

## 2024-07-15 DIAGNOSIS — Z85118 Personal history of other malignant neoplasm of bronchus and lung: Secondary | ICD-10-CM | POA: Insufficient documentation

## 2024-07-15 DIAGNOSIS — J449 Chronic obstructive pulmonary disease, unspecified: Secondary | ICD-10-CM | POA: Insufficient documentation

## 2024-07-15 DIAGNOSIS — E785 Hyperlipidemia, unspecified: Secondary | ICD-10-CM | POA: Diagnosis not present

## 2024-07-15 DIAGNOSIS — D649 Anemia, unspecified: Secondary | ICD-10-CM | POA: Insufficient documentation

## 2024-07-15 DIAGNOSIS — Z955 Presence of coronary angioplasty implant and graft: Secondary | ICD-10-CM | POA: Diagnosis not present

## 2024-07-15 DIAGNOSIS — Z79899 Other long term (current) drug therapy: Secondary | ICD-10-CM | POA: Diagnosis not present

## 2024-07-15 DIAGNOSIS — Z7902 Long term (current) use of antithrombotics/antiplatelets: Secondary | ICD-10-CM | POA: Insufficient documentation

## 2024-07-15 DIAGNOSIS — Z720 Tobacco use: Secondary | ICD-10-CM | POA: Diagnosis not present

## 2024-07-15 DIAGNOSIS — I2510 Atherosclerotic heart disease of native coronary artery without angina pectoris: Secondary | ICD-10-CM

## 2024-07-15 DIAGNOSIS — Z9221 Personal history of antineoplastic chemotherapy: Secondary | ICD-10-CM | POA: Insufficient documentation

## 2024-07-15 SURGERY — LEFT HEART CATH AND CORONARY ANGIOGRAPHY
Anesthesia: LOCAL

## 2024-07-15 MED ORDER — SODIUM CHLORIDE 0.9% FLUSH: 3.0000 mL | Freq: Two times a day (BID) | INTRAVENOUS | Status: DC

## 2024-07-15 MED ORDER — HEPARIN (PORCINE) IN NACL 1000-0.9 UT/500ML-% IV SOLN
INTRAVENOUS | Status: DC | PRN
  Administered 2024-07-15 (×2): 500 mL

## 2024-07-15 MED ORDER — ASPIRIN 81 MG PO CHEW: 81.0000 mg | CHEWABLE_TABLET | ORAL | Status: DC

## 2024-07-15 MED ORDER — FREE WATER: 500.0000 mL | Freq: Once | Status: DC

## 2024-07-15 MED ORDER — HEPARIN SODIUM (PORCINE) 1000 UNIT/ML IJ SOLN
INTRAMUSCULAR | Status: DC | PRN
  Administered 2024-07-15: 4000 [IU] via INTRAVENOUS

## 2024-07-15 MED ORDER — MIDAZOLAM HCL 2 MG/2ML IJ SOLN
INTRAMUSCULAR | Status: DC | PRN
  Administered 2024-07-15: 2 mg via INTRAVENOUS

## 2024-07-15 MED ORDER — LIDOCAINE HCL (PF) 1 % IJ SOLN
INTRAMUSCULAR | Status: AC
  Filled 2024-07-15: qty 30

## 2024-07-15 MED ORDER — SODIUM CHLORIDE 0.9 % IV SOLN
INTRAVENOUS | Status: DC | PRN
  Administered 2024-07-15: 500 mL via INTRAVENOUS

## 2024-07-15 MED ORDER — LIDOCAINE HCL (PF) 1 % IJ SOLN
INTRAMUSCULAR | Status: DC | PRN
  Administered 2024-07-15: 2 mL via INTRADERMAL

## 2024-07-15 MED ORDER — HYDRALAZINE HCL 20 MG/ML IJ SOLN: 10.0000 mg | INTRAMUSCULAR | Status: DC | PRN

## 2024-07-15 MED ORDER — SODIUM CHLORIDE 0.9 % IV SOLN: 250.0000 mL | INTRAVENOUS | Status: DC | PRN

## 2024-07-15 MED ORDER — SODIUM CHLORIDE 0.9% FLUSH: 3.0000 mL | INTRAVENOUS | Status: DC | PRN

## 2024-07-15 MED ORDER — HEPARIN SODIUM (PORCINE) 1000 UNIT/ML IJ SOLN
INTRAMUSCULAR | Status: AC
  Filled 2024-07-15: qty 10

## 2024-07-15 MED ORDER — ACETAMINOPHEN 325 MG PO TABS: 650.0000 mg | ORAL_TABLET | ORAL | Status: DC | PRN

## 2024-07-15 MED ORDER — ONDANSETRON HCL 4 MG/2ML IJ SOLN: 4.0000 mg | Freq: Four times a day (QID) | INTRAMUSCULAR | Status: DC | PRN

## 2024-07-15 MED ORDER — MIDAZOLAM HCL 2 MG/2ML IJ SOLN
INTRAMUSCULAR | Status: AC
  Filled 2024-07-15: qty 2

## 2024-07-15 MED ORDER — VERAPAMIL HCL 2.5 MG/ML IV SOLN
INTRAVENOUS | Status: AC
  Filled 2024-07-15: qty 2

## 2024-07-15 MED ORDER — LABETALOL HCL 5 MG/ML IV SOLN: 10.0000 mg | INTRAVENOUS | Status: DC | PRN

## 2024-07-15 MED ORDER — IOHEXOL 350 MG/ML SOLN
INTRAVENOUS | Status: DC | PRN
  Administered 2024-07-15: 40 mL

## 2024-07-15 MED ORDER — VERAPAMIL HCL 2.5 MG/ML IV SOLN
INTRAVENOUS | Status: DC | PRN
  Administered 2024-07-15: 10 mL via INTRA_ARTERIAL

## 2024-07-15 SURGICAL SUPPLY — 11 items
CATH INFINITI AMBI 5FR TG (CATHETERS) IMPLANT
CATH INFINITI JR4 5F (CATHETERS) IMPLANT
DEVICE RAD COMP TR BAND LRG (VASCULAR PRODUCTS) IMPLANT
GLIDESHEATH SLEND SS 6F .021 (SHEATH) IMPLANT
GUIDEWIRE INQWIRE 1.5J.035X260 (WIRE) IMPLANT
KIT MICROPUNCTURE NIT STIFF (SHEATH) IMPLANT
PACK CARDIAC CATHETERIZATION (CUSTOM PROCEDURE TRAY) ×1 IMPLANT
SET ATX-X65L (MISCELLANEOUS) IMPLANT
SHEATH PINNACLE 7F 10CM (SHEATH) IMPLANT
SHEATH PROBE COVER 6X72 (BAG) IMPLANT
SHIELD CATH-GARD CONTAMINATION (MISCELLANEOUS) IMPLANT

## 2024-07-15 NOTE — Interval H&P Note (Signed)
 History and Physical Interval Note:  07/15/2024 10:48 AM  [name]  has presented today for surgery, with the diagnosis of chest pain - cad.  The various methods of treatment have been discussed with the patient and family. After consideration of risks, benefits and other options for treatment, the patient has consented to  Procedure(s): LEFT HEART CATH AND CORONARY ANGIOGRAPHY (N/A)  PERCUTANEOUS CORONARY INTERVENTION  as a surgical intervention.  The patient's history has been reviewed, patient examined, no change in status, stable for surgery.  I have reviewed the patient's chart and labs.  Questions were answered to the patient's satisfaction.    Cath Lab Visit (complete for each Cath Lab visit)  Clinical Evaluation Leading to the Procedure:   ACS: No.  Non-ACS:    Anginal Classification: CCS III  Anti-ischemic medical therapy: Minimal Therapy (1 class of medications)  Non-Invasive Test Results: Low-risk stress test findings: cardiac mortality <1%/year  Prior CABG: No previous CABG    Alm Clay

## 2024-07-15 NOTE — Progress Notes (Signed)
 Prior to transport to cath lab, client c/o left chest spasm feeling, Millicent, RN in cath lab notified and client transferred to cath lab via stretcher

## 2024-07-15 NOTE — Discharge Instructions (Addendum)

## 2024-07-15 NOTE — Progress Notes (Signed)
 TR band removed. Dressing to clean, dry, and intact. No hematoma or bleeding noted. Patient and wife received discharge instructions. No concerns voiced.

## 2024-07-16 ENCOUNTER — Encounter (HOSPITAL_COMMUNITY): Payer: Self-pay | Admitting: Cardiology

## 2024-08-06 ENCOUNTER — Other Ambulatory Visit: Payer: Self-pay | Admitting: Nurse Practitioner

## 2024-08-08 ENCOUNTER — Inpatient Hospital Stay: Attending: Internal Medicine

## 2024-08-08 ENCOUNTER — Ambulatory Visit (HOSPITAL_COMMUNITY)
Admission: RE | Admit: 2024-08-08 | Discharge: 2024-08-08 | Disposition: A | Discharge status: Home / Self Care | Source: Ambulatory Visit | Attending: Internal Medicine | Admitting: Internal Medicine

## 2024-08-08 DIAGNOSIS — C3490 Malignant neoplasm of unspecified part of unspecified bronchus or lung: Secondary | ICD-10-CM

## 2024-08-08 DIAGNOSIS — C3412 Malignant neoplasm of upper lobe, left bronchus or lung: Secondary | ICD-10-CM | POA: Insufficient documentation

## 2024-08-08 DIAGNOSIS — J8410 Pulmonary fibrosis, unspecified: Secondary | ICD-10-CM | POA: Diagnosis not present

## 2024-08-08 DIAGNOSIS — D509 Iron deficiency anemia, unspecified: Secondary | ICD-10-CM | POA: Insufficient documentation

## 2024-08-08 DIAGNOSIS — R0609 Other forms of dyspnea: Secondary | ICD-10-CM | POA: Insufficient documentation

## 2024-08-08 DIAGNOSIS — J439 Emphysema, unspecified: Secondary | ICD-10-CM | POA: Diagnosis not present

## 2024-08-08 DIAGNOSIS — Z9221 Personal history of antineoplastic chemotherapy: Secondary | ICD-10-CM | POA: Insufficient documentation

## 2024-08-08 DIAGNOSIS — R042 Hemoptysis: Secondary | ICD-10-CM | POA: Diagnosis not present

## 2024-08-08 DIAGNOSIS — Z923 Personal history of irradiation: Secondary | ICD-10-CM | POA: Diagnosis not present

## 2024-08-08 LAB — CBC WITH DIFFERENTIAL (CANCER CENTER ONLY)
Abs Immature Granulocytes: 0.02 K/uL (ref 0.00–0.07)
Basophils Absolute: 0 K/uL (ref 0.0–0.1)
Basophils Relative: 1 %
Eosinophils Absolute: 0.2 K/uL (ref 0.0–0.5)
Eosinophils Relative: 3 %
HCT: 35.6 % — ABNORMAL LOW (ref 39.0–52.0)
Hemoglobin: 12.2 g/dL — ABNORMAL LOW (ref 13.0–17.0)
Immature Granulocytes: 0 %
Lymphocytes Relative: 16 %
Lymphs Abs: 1 K/uL (ref 0.7–4.0)
MCH: 31.9 pg (ref 26.0–34.0)
MCHC: 34.3 g/dL (ref 30.0–36.0)
MCV: 93.2 fL (ref 80.0–100.0)
Monocytes Absolute: 0.7 K/uL (ref 0.1–1.0)
Monocytes Relative: 11 %
Neutro Abs: 4 K/uL (ref 1.7–7.7)
Neutrophils Relative %: 69 %
Platelet Count: 232 K/uL (ref 150–400)
RBC: 3.82 MIL/uL — ABNORMAL LOW (ref 4.22–5.81)
RDW: 15.5 % (ref 11.5–15.5)
WBC Count: 5.9 K/uL (ref 4.0–10.5)
nRBC: 0 % (ref 0.0–0.2)

## 2024-08-08 LAB — CMP (CANCER CENTER ONLY)
ALT: 12 U/L (ref 0–44)
AST: 12 U/L — ABNORMAL LOW (ref 15–41)
Albumin: 4 g/dL (ref 3.5–5.0)
Alkaline Phosphatase: 78 U/L (ref 38–126)
Anion gap: 5 (ref 5–15)
BUN: 8 mg/dL (ref 8–23)
CO2: 28 mmol/L (ref 22–32)
Calcium: 9.4 mg/dL (ref 8.9–10.3)
Chloride: 106 mmol/L (ref 98–111)
Creatinine: 0.87 mg/dL (ref 0.61–1.24)
GFR, Estimated: >60 mL/min (ref >60)
Glucose, Bld: 115 mg/dL — ABNORMAL HIGH (ref 70–99)
Potassium: 3.8 mmol/L (ref 3.5–5.1)
Sodium: 139 mmol/L (ref 135–145)
Total Bilirubin: 0.4 mg/dL (ref 0.0–1.2)
Total Protein: 6.8 g/dL (ref 6.5–8.1)

## 2024-08-08 MED ORDER — IOHEXOL 300 MG/ML  SOLN
75.0000 mL | Freq: Once | INTRAMUSCULAR | Status: AC | PRN
  Administered 2024-08-08: 75 mL via INTRAVENOUS

## 2024-08-08 MED ORDER — SODIUM CHLORIDE (PF) 0.9 % IJ SOLN
INTRAMUSCULAR | Status: AC
  Filled 2024-08-08: qty 50

## 2024-08-15 ENCOUNTER — Inpatient Hospital Stay: Admitting: Internal Medicine

## 2024-08-15 VITALS — BP 112/66 | HR 65 | Temp 97.8°F | Resp 17 | Ht 70.0 in | Wt 197.0 lb

## 2024-08-15 DIAGNOSIS — Z923 Personal history of irradiation: Secondary | ICD-10-CM | POA: Diagnosis not present

## 2024-08-15 DIAGNOSIS — C3490 Malignant neoplasm of unspecified part of unspecified bronchus or lung: Secondary | ICD-10-CM

## 2024-08-15 DIAGNOSIS — C3412 Malignant neoplasm of upper lobe, left bronchus or lung: Secondary | ICD-10-CM | POA: Diagnosis not present

## 2024-08-15 NOTE — Progress Notes (Signed)
 [facility] [phone]   [phone]  OFFICE PROGRESS NOTE  Arloa Elsie SAUNDERS, MD [phone] [place] [address]  DIAGNOSIS:  limited stage (T1c, N2, M0) small cell lung cancer with mixture of squamous cell carcinoma in the left upper lobe presented with left upper lobe lung nodule in addition to left hilar and mediastinal lymphadenopathy diagnosed in May 2023.  PRIOR THERAPY:  1) Systemic chemotherapy with cisplatin  75 Mg/M2 on day 1 and etoposide  100 Mg/M2 on days 1, 2 and 3 every 3 weeks for 4 cycles.  This was concurrent with radiotherapy for around 6 weeks during this course of treatment.  Status post 4 cycles.  Last cycle was given on 06/09/2022. 2) prophylactic cranial irradiation under the care of Dr. Patrcia. 3) SBRT to right upper lobe lung nodule under the care of Dr. Patrcia.  CURRENT THERAPY: Observation.  INTERVAL HISTORY: Francisco Austin 73 y.o. male returns to the clinic today for 94-month follow-up visit accompanied by his wife. Discussed the use of AI scribe software for clinical note transcription with the patient, who gave verbal consent to proceed.  History of Present Illness Francisco Austin is a 73 year old male with limited stage muscle and lung cancer who presents for evaluation with a repeat CT scan of the chest for restaging of his disease. He is accompanied by his wife.  He was diagnosed with limited stage muscle and lung cancer in May 2023. His treatment regimen included systemic chemotherapy with cisplatin  and autobucide concurrent with radiation, followed by prophylactic cranial irradiation. Additionally, he received SPRT to a right upper lobe lung nodule. Currently, he is under observation and is here for a repeat CT scan to restage his disease.  Approximately two to three weeks ago, he experienced episodes of hemoptysis, with one episode involving black blood and two episodes involving dark red blood.  These episodes occurred spontaneously without preceding cough and have not recurred since.  He describes his breathing as limited, stating he can manage nine holes of golf before feeling exhausted. Despite taking iron supplements twice daily with vitamin C to aid absorption, he reports a lack of strength. His hemoglobin level is 12.2. He continues to experience fatigue and reduced exercise tolerance. He has a history of low iron levels and takes a vitamin supplement twice daily. Despite this, he reports persistent fatigue and a lack of strength.      MEDICAL HISTORY: Past Medical History:  Diagnosis Date   Agent orange exposure    Anxiety    Arthritis    Back pain    CAD (coronary artery disease)    Chronic chest pain    COPD (chronic obstructive pulmonary disease) (HCC)    Coronary vasospasm    Headache    cluster   History of kidney stones    HLD (hyperlipidemia)    HLD (hyperlipidemia)    HTN (hypertension)    Lymphadenopathy, hilar    left   Mediastinal lymphadenopathy    Pre-diabetes    Small cell carcinoma (HCC)    Squamous cell carcinoma of lung (HCC)     ALLERGIES:  is allergic to gabapentin, morphine, other, prednisone, alprazolam, fentanyl , niacin, and penicillamine.  MEDICATIONS:  Current Outpatient Medications  Medication Sig Dispense Refill   albuterol  (VENTOLIN  HFA) 108 (90 Base) MCG/ACT inhaler Inhale 2 puffs into the lungs every 6 (six) hours as needed for wheezing or shortness of breath.  aspirin  EC 81 MG tablet Take 81 mg by mouth daily.     atorvastatin  (LIPITOR ) 80 MG tablet Take 1 tablet (80 mg total) by mouth daily. 90 tablet 1   Cholecalciferol 50 MCG (2000 UT) TABS Take 2,000 Units by mouth daily.     clopidogrel  (PLAVIX ) 75 MG tablet TAKE ONE TABLET BY MOUTH ONCE DAILY WITH BREAKFAST 90 tablet 3   diclofenac sodium (VOLTAREN) 1 % GEL Apply 1 g topically daily as needed (pain).     diltiazem  (TIAZAC ) 240 MG 24 hr capsule Take 240 mg by mouth  daily.     esomeprazole (NEXIUM) 40 MG capsule Take 40 mg by mouth daily.     HYDROcodone -acetaminophen  (NORCO) 10-325 MG tablet Take 1 tablet by mouth 5 (five) times daily.     LORazepam  (ATIVAN ) 1 MG tablet Take 1 mg by mouth See admin instructions. Take 1mg  (1 tablet) by mouth twice daily and 0.5mg  (1/2 tablet) in addition daily.     nitroGLYCERIN  (NITROSTAT ) 0.4 MG SL tablet Place 0.4 mg under the tongue every 5 (five) minutes as needed for chest pain.     Oxycodone HCl 10 MG TABS 1 tablet as needed Orally every 6 hrs for 30 days     ranolazine  (RANEXA ) 500 MG 12 hr tablet TAKE 1 TABLET BY MOUTH TWICE A DAY 180 tablet 3   telmisartan (MICARDIS) 80 MG tablet Take 80 mg by mouth daily.     umeclidinium-vilanterol (ANORO ELLIPTA) 62.5-25 MCG/INH AEPB Inhale 1 puff into the lungs daily.     vitamin B-12 (CYANOCOBALAMIN) 500 MCG tablet Take 500 mcg by mouth daily.     No current facility-administered medications for this visit.   Facility-Administered Medications Ordered in Other Visits  Medication Dose Route Frequency Provider Last Rate Last Admin   prochlorperazine  (COMPAZINE ) 10 MG/2ML injection             SURGICAL HISTORY:  Past Surgical History:  Procedure Laterality Date   APPENDECTOMY     BRONCHIAL BIOPSY  03/31/2024   Procedure: BRONCHOSCOPY, WITH BIOPSY;  Surgeon: Kerrin Elspeth BROCKS, MD;  Location: [facility] ENDOSCOPY;  Service: Thoracic;;   BRONCHIAL NEEDLE ASPIRATION BIOPSY  03/31/2024   Procedure: BRONCHOSCOPY, WITH NEEDLE ASPIRATION BIOPSY;  Surgeon: Kerrin Elspeth BROCKS, MD;  Location: [facility] ENDOSCOPY;  Service: Thoracic;;   BRONCHIAL WASHINGS  03/31/2024   Procedure: IRRIGATION, BRONCHUS;  Surgeon: Kerrin Elspeth BROCKS, MD;  Location: [facility] ENDOSCOPY;  Service: Thoracic;;   CARDIAC CATHETERIZATION     multiple with Dr. Blanca (no stent placed at that time)   CORONARY STENT INTERVENTION N/A 12/24/2021   Procedure: CORONARY STENT INTERVENTION;  Surgeon: Dann Candyce RAMAN, MD;   Location: [facility] INVASIVE CV LAB;  Service: Cardiovascular;  Laterality: N/A;   CORONARY ULTRASOUND/IVUS N/A 12/24/2021   Procedure: Intravascular Ultrasound/IVUS;  Surgeon: Dann Candyce RAMAN, MD;  Location: [facility] INVASIVE CV LAB;  Service: Cardiovascular;  Laterality: N/A;   HEMORROIDECTOMY     LEFT HEART CATH AND CORONARY ANGIOGRAPHY N/A 12/23/2021   Procedure: LEFT HEART CATH AND CORONARY ANGIOGRAPHY;  Surgeon: Court Dorn PARAS, MD;  Location: [facility] INVASIVE CV LAB;  Service: Cardiovascular;  Laterality: N/A;   LEFT HEART CATH AND CORONARY ANGIOGRAPHY N/A 07/15/2024   Procedure: LEFT HEART CATH AND CORONARY ANGIOGRAPHY;  Surgeon: Anner Alm ORN, MD;  Location: [facility] INVASIVE CV LAB;  Service: Cardiovascular;  Laterality: N/A;   TONSILLECTOMY     VIDEO BRONCHOSCOPY WITH ENDOBRONCHIAL NAVIGATION N/A 03/31/2024   Procedure: VIDEO BRONCHOSCOPY WITH ENDOBRONCHIAL NAVIGATION;  Surgeon: Kerrin Elspeth BROCKS, MD;  Location: [facility] ENDOSCOPY;  Service: Thoracic;  Laterality: N/A;  robotic bronchoscopy for biopsy of right upper and left upper lobe nodules   VIDEO BRONCHOSCOPY WITH ENDOBRONCHIAL ULTRASOUND N/A 03/12/2022   Procedure: VIDEO BRONCHOSCOPY WITH ENDOBRONCHIAL ULTRASOUND;  Surgeon: Kerrin Elspeth BROCKS, MD;  Location: [facility] OR;  Service: Thoracic;  Laterality: N/A;    REVIEW OF SYSTEMS:  Constitutional: positive for fatigue Eyes: negative Ears, nose, mouth, throat, and face: negative Respiratory: positive for cough and dyspnea on exertion Cardiovascular: negative Gastrointestinal: negative Genitourinary:negative Integument/breast: negative Hematologic/lymphatic: negative Musculoskeletal:negative Neurological: negative Behavioral/Psych: negative Endocrine: negative Allergic/Immunologic: negative   PHYSICAL EXAMINATION: General appearance: alert, cooperative, fatigued, and no distress Head: Normocephalic, without obvious abnormality, atraumatic Neck: no adenopathy, no JVD, supple, symmetrical,  trachea midline, and thyroid  not enlarged, symmetric, no tenderness/mass/nodules Lymph nodes: Cervical, supraclavicular, and axillary nodes normal. Resp: clear to auscultation bilaterally Back: symmetric, no curvature. ROM normal. No CVA tenderness. Cardio: regular rate and rhythm, S1, S2 normal, no murmur, click, rub or gallop GI: soft, non-tender; bowel sounds normal; no masses,  no organomegaly Extremities: extremities normal, atraumatic, no cyanosis or edema Neurologic: Alert and oriented X 3, normal strength and tone. Normal symmetric reflexes. Normal coordination and gait  ECOG PERFORMANCE STATUS: 1 - Symptomatic but completely ambulatory  Blood pressure 112/66, pulse 65, temperature 97.8 F (36.6 C), resp. rate 17, height 5' 10 (1.778 m), weight 197 lb (89.4 kg), SpO2 95%.  LABORATORY DATA: Lab Results  Component Value Date   WBC 5.9 08/08/2024   HGB 12.2 (L) 08/08/2024   HCT 35.6 (L) 08/08/2024   MCV 93.2 08/08/2024   PLT 232 08/08/2024      Chemistry      Component Value Date/Time   NA 139 08/08/2024 0954   NA 142 07/13/2024 1136   K 3.8 08/08/2024 0954   CL 106 08/08/2024 0954   CO2 28 08/08/2024 0954   BUN 8 08/08/2024 0954   BUN 9 07/13/2024 1136   CREATININE 0.87 08/08/2024 0954      Component Value Date/Time   CALCIUM  9.4 08/08/2024 0954   ALKPHOS 78 08/08/2024 0954   AST 12 (L) 08/08/2024 0954   ALT 12 08/08/2024 0954   BILITOT 0.4 08/08/2024 0954       RADIOGRAPHIC STUDIES: CT Chest W Contrast Result Date: 08/09/2024 CLINICAL DATA:  Non-small cell lung cancer EXAM: CT CHEST WITH CONTRAST TECHNIQUE: Multidetector CT imaging of the chest was performed during intravenous contrast administration. RADIATION DOSE REDUCTION: This exam was performed according to the departmental dose-optimization program which includes automated exposure control, adjustment of the mA and/or kV according to patient size and/or use of iterative reconstruction technique.  CONTRAST:  75mL OMNIPAQUE  IOHEXOL  300 MG/ML  SOLN COMPARISON:  PET-CT February 08, 2024. FINDINGS: Cardiovascular: The heart size is normal. Interatrial lipomatous hypertrophy measuring 5 x 4.7 cm. Atherosclerotic calcifications of coronary arteries. No pericardial effusion. Mediastinum/Nodes: Multi station mediastinal lymph nodes measuring up to 1.4 cm, stable to prior. Lungs/Pleura: Part solid spiculated nodule in right lung apex measuring 1.6 x 1.4 cm, previously measured 1.9 x 1.5 cm. Upper lobe predominant centrilobular and paraseptal emphysematous changes. Left apical focal scarring/atelectasis versus subsolid nodule measuring 1.9 x 2.2 cm (8/62). Stable left perihilar/infrahilar soft tissue secondary to postradiation changes site of known malignancy, grossly similar to prior. Severe bronchial and bronchiolar wall thickening left-greater-than-right. No pleural effusion or pneumothorax. Upper Abdomen: Thickening of left adrenal gland. Bilateral nonobstructive nephrolithiasis of both kidneys. Fatty infiltration of  the pancreas. Small hiatal hernia. Musculoskeletal: No suspicious osseous lesion. IMPRESSION: Posttreatment changes in left perihilar/infrahilar site of known malignancy, similar to prior. Residual/recurrent viable malignancy can not be completely excluded on current CT. Correlate with dedicated PET-CT images. Stable contralateral spiculated part solid right apical nodule. Part solid nodule versus focus of scarring/emphysematous changes in left upper lobe is stable to prior. 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 Thickening of left adrenal gland, stable to prior PET-CT. Electronically Signed   By: Megan  Zare M.D.   On: 08/09/2024 18:31      ASSESSMENT AND PLAN: This is a very pleasant 73 years old white male diagnosed with limited stage (T1c, N2, M0) small cell lung cancer with mixture of squamous cell carcinoma in the left  upper lobe presented with left upper lobe lung nodule in addition to left hilar and mediastinal lymphadenopathy diagnosed in May 2023.  The patient underwent systemic chemotherapy with cisplatin  75 Mg/M2 on day 1 and etoposide  100 Mg/M2 on days 1, 2 and 3 status post 4 cycles.  This is concurrent with radiotherapy.  Last dose of chemotherapy was on 06/09/2022.  This was followed by prophylactic cranial irradiation. He was found to have part solid right upper lobe nodule and bronchoscopy revealed atypical cells suspicious for lung cancer. He underwent SBRT to the right upper lobe lung nodule under the care of Dr. Patrcia. The patient is currently on observation.  He had repeat CT scan of the chest performed recently.  I personally independently reviewed the scan images and discussed the results and showed the images to the patient and his wife. Her scan showed stable disease with most treatment changes in the left perihilar/infrahilar area but need close monitoring to rule out any recurrent disease. Assessment and Plan Assessment & Plan Small cell lung cancer, status post chemoradiation, under surveillance Small cell lung cancer diagnosed in May 2023, status post systemic chemotherapy with cisplatin  and autobucide concurrent with radiation, followed by prophylactic cranial irradiation and SBRT to right upper lobe lung nodule. Currently under surveillance. Recent CT scan shows stable disease with no significant changes. Right lung nodule appears smaller, and left lung area remains dense, likely due to previous radiation. No active tumor identified, and PET scan in April was clear. Left adrenal gland thickening is stable. No indication for immediate PET scan unless symptoms worsen or scan shows concerning changes. - Continue surveillance with repeat CT scan in 3 months - Monitor for symptoms such as increased hemoptysis or other concerning signs - Consider PET scan if new symptoms develop or if future scans  show concerning changes  Hemoptysis Reported episode of hemoptysis approximately 2-3 weeks ago, with one instance of black blood and two instances of dark red blood. Hemoptysis could be related to previous radiation or scarring. - Monitor for recurrence of hemoptysis - Advise to report any new or worsening episodes of hemoptysis immediately  Dyspnea on exertion Experiences dyspnea on exertion, particularly after playing nine holes of golf. - Encourage increased physical activity as tolerated - Monitor for changes in respiratory symptoms  Iron deficiency anemia Iron deficiency anemia with hemoglobin at 12.2, which is low but not severely so. Currently taking iron supplementation twice daily. Symptoms include lack of strength, possibly related to anemia. - Continue current iron supplementation regimen - Take iron with vitamin C to enhance absorption - Monitor hemoglobin levels and symptoms The patient was advised to call immediately if he has  any concerning symptoms in the interval. The patient voices understanding of current disease status and treatment options and is in agreement with the current care plan.  All questions were answered. The patient knows to call the clinic with any problems, questions or concerns. We can certainly see the patient much sooner if necessary.  The total time spent in the appointment was 30 minutes.  Disclaimer: This note was dictated with voice recognition software. Similar sounding words can inadvertently be transcribed and may not be corrected upon review.

## 2024-08-16 ENCOUNTER — Telehealth: Payer: Self-pay | Admitting: Internal Medicine

## 2024-08-16 NOTE — Telephone Encounter (Signed)
 Scheduled patient for next appointments. Called and left a voicemail with the appointments days and times.

## 2024-09-07 DIAGNOSIS — M545 Low back pain, unspecified: Secondary | ICD-10-CM | POA: Diagnosis not present

## 2024-09-07 DIAGNOSIS — S20214A Contusion of middle front wall of thorax, initial encounter: Secondary | ICD-10-CM | POA: Diagnosis not present

## 2024-09-07 DIAGNOSIS — R319 Hematuria, unspecified: Secondary | ICD-10-CM | POA: Diagnosis not present

## 2024-09-12 ENCOUNTER — Encounter: Payer: Self-pay | Admitting: Cardiovascular Disease

## 2024-09-12 ENCOUNTER — Ambulatory Visit: Attending: Cardiovascular Disease | Admitting: Cardiovascular Disease

## 2024-09-12 VITALS — BP 100/60 | HR 60 | Ht 70.0 in | Wt 192.0 lb

## 2024-09-12 DIAGNOSIS — I10 Essential (primary) hypertension: Secondary | ICD-10-CM | POA: Diagnosis not present

## 2024-09-12 DIAGNOSIS — I25119 Atherosclerotic heart disease of native coronary artery with unspecified angina pectoris: Secondary | ICD-10-CM

## 2024-09-12 DIAGNOSIS — E782 Mixed hyperlipidemia: Secondary | ICD-10-CM

## 2024-09-12 NOTE — Assessment & Plan Note (Signed)
 History of essential hypertension with blood pressure measured today at 100/60.  He is on diltiazem  and Micardis.

## 2024-09-12 NOTE — Progress Notes (Signed)
 09/12/2024 Francisco Austin   August 10, 1951  [id number]  Primary Physician Francisco Elsie SAUNDERS, MD Primary Cardiologist: Francisco JINNY Lesches MD Francisco Austin, [place]  HPI:  Francisco Austin is a 73 y.o.   father of 2 children, with no grandchildren, former patient of Dr. Gabe, who was referred to me by Dr. Arloa to be established in my practice because of known coronary artery disease.  He is a retired financial trader.  He occasionally smokes a pipe.  I last saw him in the office 07/10/2023. He does have treated hypertension hyperlipidemia.  His mother Francisco Austin who died in September 23, 2017 was my patient as well.  He is never had a heart attack or stroke.  He is had several heart catheterizations Dr. Blanca remotely with one PCI but no stent.  He does get frequent atypical chest pain.  His last 2D echo performed 04/06/2017 was essentially normal.   Because of ongoing chest pain I obtain a coronary CTA on 12/16/2021 that revealed a coronary calcium  score of 1970 with moderate to severe multivessel disease.  Based on this I performed outpatient radial diagnostic cath on him 12/23/2021 revealing a high-grade calcified proximal LAD disease as well as proximal to mid RCA disease.  LV function was normal.  He did develop hypotension with a conduction abnormality when my TIG catheter across his aortic valve and therefore I aborted his intervention and staged the following day.  Dr. Dann stented his proximal LAD and performed orbital atherectomy and stenting of his RCA.  He was discharged home the following day on aspirin  and clopidogrel .   Unfortunately, he was diagnosed with small cell and squamous cell lung cancer and is undergoing chemotherapy and radiation therapy.  He is scheduled to undergo brain radiation therapy as well.  He is treated by Dr. Gatha Francisco.     Since I saw him a year ago he continues to do well from a heart point of view.  He did have some chest pain and was evaluated by Francisco Alberts, NP 07/13/2024 and ultimately underwent diagnostic outpatient radial diagnostic cath by Dr. Anner 07/15/2024 revealing patent stents with unchanged anatomy.  Was thought his chest pain was noncardiac.   Current Meds  Medication Sig   albuterol  (VENTOLIN  HFA) 108 (90 Base) MCG/ACT inhaler Inhale 2 puffs into the lungs every 6 (six) hours as needed for wheezing or shortness of breath.   aspirin  EC 81 MG tablet Take 81 mg by mouth daily.   atorvastatin  (LIPITOR ) 80 MG tablet Take 1 tablet (80 mg total) by mouth daily.   Cholecalciferol 50 MCG (2000 UT) TABS Take 2,000 Units by mouth daily.   clopidogrel  (PLAVIX ) 75 MG tablet TAKE ONE TABLET BY MOUTH ONCE DAILY WITH BREAKFAST   diclofenac sodium (VOLTAREN) 1 % GEL Apply 1 g topically daily as needed (pain).   diltiazem  (TIAZAC ) 240 MG 24 hr capsule Take 240 mg by mouth daily.   esomeprazole (NEXIUM) 40 MG capsule Take 40 mg by mouth daily.   HYDROcodone -acetaminophen  (NORCO) 10-325 MG tablet Take 1 tablet by mouth 5 (five) times daily.   LORazepam  (ATIVAN ) 1 MG tablet Take 1 mg by mouth See admin instructions. Take 1mg  (1 tablet) by mouth twice daily and 0.5mg  (1/2 tablet) in addition daily.   nitroGLYCERIN  (NITROSTAT ) 0.4 MG SL tablet Place 0.4 mg under the tongue every 5 (five) minutes as needed for chest pain.   Oxycodone HCl 10 MG TABS 1 tablet as needed Orally  every 6 hrs for 30 days   ranolazine  (RANEXA ) 500 MG 12 hr tablet TAKE 1 TABLET BY MOUTH TWICE A DAY   telmisartan (MICARDIS) 80 MG tablet Take 80 mg by mouth daily.   umeclidinium-vilanterol (ANORO ELLIPTA) 62.5-25 MCG/INH AEPB Inhale 1 puff into the lungs daily.   vitamin B-12 (CYANOCOBALAMIN) 500 MCG tablet Take 500 mcg by mouth daily.     Allergies  Allergen Reactions   Gabapentin Anaphylaxis and Other (See Comments)    B/P dropped to 40    Morphine Other (See Comments)    Burn his veins   Other Anaphylaxis    Androgenic Anabolic Steroid   Prednisone Anaphylaxis    Alprazolam Other (See Comments)    Other reaction(s): weirds him out   Fentanyl      Does not relieve pain for him    Niacin Other (See Comments)    Body feel weird   Penicillamine Hives    Social History   Socioeconomic History   Marital status: Married    Spouse name: Not on file   Number of children: 2   Years of education: Not on file   Highest education level: Not on file  Occupational History   Not on file  Tobacco Use   Smoking status: Every Day    Types: Pipe   Smokeless tobacco: Never  Vaping Use   Vaping status: Never Used  Substance and Sexual Activity   Alcohol use: Never   Drug use: Never   Sexual activity: Not Currently  Other Topics Concern   Not on file  Social History Narrative   Not on file   Social Drivers of Health   Financial Resource Strain: Not on file  Food Insecurity: No Food Insecurity (04/13/2024)   Hunger Vital Sign    Worried About Running Out of Food in the Last Year: Never true    Ran Out of Food in the Last Year: Never true  Transportation Needs: No Transportation Needs (04/13/2024)   PRAPARE - Administrator, Civil Service (Medical): No    Lack of Transportation (Non-Medical): No  Physical Activity: Not on file  Stress: Not on file  Social Connections: Not on file  Intimate Partner Violence: Not At Risk (04/13/2024)   Humiliation, Afraid, Rape, and Kick questionnaire    Fear of Current or Ex-Partner: No    Emotionally Abused: No    Physically Abused: No    Sexually Abused: No     Review of Systems: General: negative for chills, fever, night sweats or weight changes.  Cardiovascular: negative for chest pain, dyspnea on exertion, edema, orthopnea, palpitations, paroxysmal nocturnal dyspnea or shortness of breath Dermatological: negative for rash Respiratory: negative for cough or wheezing Urologic: negative for hematuria Abdominal: negative for nausea, vomiting, diarrhea, bright red blood per rectum, melena, or  hematemesis Neurologic: negative for visual changes, syncope, or dizziness All other systems reviewed and are otherwise negative except as noted above.    Blood pressure 100/60, pulse 60, height 5' 10 (1.778 m), weight 192 lb (87.1 kg).  General appearance: alert and no distress Neck: no adenopathy, no carotid bruit, no JVD, supple, symmetrical, trachea midline, and thyroid  not enlarged, symmetric, no tenderness/mass/nodules Lungs: clear to auscultation bilaterally Heart: regular rate and rhythm, S1, S2 normal, no murmur, click, rub or gallop Extremities: extremities normal, atraumatic, no cyanosis or edema Pulses: 2+ and symmetric Skin: Skin color, texture, turgor normal. No rashes or lesions Neurologic: Grossly normal  EKG not performed today  ASSESSMENT AND PLAN:   Essential hypertension History of essential hypertension with blood pressure measured today at 100/60.  He is on diltiazem  and Micardis.  Hyperlipidemia History of hyperlipidemia on atorvastatin  with lipid profile performed 08/23/2023 revealing total cholesterol 127, LDL 59 and HDL of 56.  CAD (coronary artery disease) History of CAD status post proximal LAD stenting, orbital atherectomy and stenting of the RCA by Dr. Dann 12/24/2021.  Because of recurrent chest pain he was seen by Francisco Alberts, NP who arranged him to undergo outpatient heart catheterization by Dr. Anner 07/15/2024 revealing patent stents, at jailed diagonal but unchanged anatomy.  It was thought that his chest pain was noncardiac.     Francisco DOROTHA Lesches MD FACP,FACC,FAHA, [facility] 09/12/2024 10:28 AM

## 2024-09-12 NOTE — Assessment & Plan Note (Signed)
 History of hyperlipidemia on atorvastatin  with lipid profile performed 08/23/2023 revealing total cholesterol 127, LDL 59 and HDL of 56.

## 2024-09-12 NOTE — Patient Instructions (Signed)
 Medication Instructions:  Your physician recommends that you continue on your current medications as directed. Please refer to the Current Medication list given to you today.  *If you need a refill on your cardiac medications before your next appointment, please call your pharmacy*  Follow-Up: At [facility], you and your health needs are our priority.  As part of our continuing mission to provide you with exceptional heart care, our providers are all part of one team.  This team includes your primary Cardiologist (physician) and Advanced Practice Providers or APPs (Physician Assistants and Nurse Practitioners) who all work together to provide you with the care you need, when you need it.  Your next appointment:   1 year  Provider:   Dorn Lesches, MD     Other Instructions:

## 2024-09-12 NOTE — Assessment & Plan Note (Signed)
 History of CAD status post proximal LAD stenting, orbital atherectomy and stenting of the RCA by Dr. Dann 12/24/2021.  Because of recurrent chest pain he was seen by Jackee Alberts, NP who arranged him to undergo outpatient heart catheterization by Dr. Anner 07/15/2024 revealing patent stents, at jailed diagonal but unchanged anatomy.  It was thought that his chest pain was noncardiac.

## 2024-09-16 DIAGNOSIS — E1169 Type 2 diabetes mellitus with other specified complication: Secondary | ICD-10-CM | POA: Diagnosis not present

## 2024-09-16 DIAGNOSIS — Z0000 Encounter for general adult medical examination without abnormal findings: Secondary | ICD-10-CM | POA: Diagnosis not present

## 2024-09-16 DIAGNOSIS — E7800 Pure hypercholesterolemia, unspecified: Secondary | ICD-10-CM | POA: Diagnosis not present

## 2024-09-16 DIAGNOSIS — I10 Essential (primary) hypertension: Secondary | ICD-10-CM | POA: Diagnosis not present

## 2024-09-16 DIAGNOSIS — Z125 Encounter for screening for malignant neoplasm of prostate: Secondary | ICD-10-CM | POA: Diagnosis not present

## 2024-09-16 DIAGNOSIS — G894 Chronic pain syndrome: Secondary | ICD-10-CM | POA: Diagnosis not present

## 2024-09-20 DIAGNOSIS — Z1211 Encounter for screening for malignant neoplasm of colon: Secondary | ICD-10-CM | POA: Diagnosis not present

## 2024-10-12 DIAGNOSIS — C3490 Malignant neoplasm of unspecified part of unspecified bronchus or lung: Secondary | ICD-10-CM | POA: Diagnosis not present

## 2024-10-12 DIAGNOSIS — I2510 Atherosclerotic heart disease of native coronary artery without angina pectoris: Secondary | ICD-10-CM | POA: Diagnosis not present

## 2024-10-12 DIAGNOSIS — R195 Other fecal abnormalities: Secondary | ICD-10-CM | POA: Diagnosis not present

## 2024-10-31 ENCOUNTER — Ambulatory Visit
Admission: RE | Admit: 2024-10-31 | Discharge: 2024-10-31 | Disposition: A | Source: Ambulatory Visit | Attending: Student

## 2024-10-31 ENCOUNTER — Other Ambulatory Visit: Payer: Self-pay | Admitting: Student

## 2024-10-31 DIAGNOSIS — R195 Other fecal abnormalities: Secondary | ICD-10-CM

## 2024-10-31 MED ORDER — IOPAMIDOL (ISOVUE-370) INJECTION 76%
75.0000 mL | Freq: Once | INTRAVENOUS | Status: AC | PRN
  Administered 2024-10-31: 75 mL via INTRAVENOUS

## 2024-11-01 ENCOUNTER — Telehealth: Payer: Self-pay | Admitting: Internal Medicine

## 2024-11-01 NOTE — Telephone Encounter (Signed)
 Called pt and is aware of the changes to their appt

## 2024-11-07 ENCOUNTER — Inpatient Hospital Stay

## 2024-11-10 ENCOUNTER — Ambulatory Visit (HOSPITAL_COMMUNITY)
Admission: RE | Admit: 2024-11-10 | Discharge: 2024-11-10 | Disposition: A | Discharge status: Home / Self Care | Source: Ambulatory Visit | Attending: Internal Medicine | Admitting: Internal Medicine

## 2024-11-10 ENCOUNTER — Inpatient Hospital Stay: Attending: Internal Medicine

## 2024-11-10 DIAGNOSIS — M8588 Other specified disorders of bone density and structure, other site: Secondary | ICD-10-CM | POA: Insufficient documentation

## 2024-11-10 DIAGNOSIS — N200 Calculus of kidney: Secondary | ICD-10-CM | POA: Insufficient documentation

## 2024-11-10 DIAGNOSIS — R0789 Other chest pain: Secondary | ICD-10-CM | POA: Diagnosis not present

## 2024-11-10 DIAGNOSIS — C3412 Malignant neoplasm of upper lobe, left bronchus or lung: Secondary | ICD-10-CM | POA: Diagnosis present

## 2024-11-10 DIAGNOSIS — M1990 Unspecified osteoarthritis, unspecified site: Secondary | ICD-10-CM | POA: Diagnosis not present

## 2024-11-10 DIAGNOSIS — Z923 Personal history of irradiation: Secondary | ICD-10-CM | POA: Insufficient documentation

## 2024-11-10 DIAGNOSIS — C3490 Malignant neoplasm of unspecified part of unspecified bronchus or lung: Secondary | ICD-10-CM | POA: Insufficient documentation

## 2024-11-10 DIAGNOSIS — G8929 Other chronic pain: Secondary | ICD-10-CM | POA: Diagnosis not present

## 2024-11-10 DIAGNOSIS — Z9221 Personal history of antineoplastic chemotherapy: Secondary | ICD-10-CM | POA: Diagnosis not present

## 2024-11-10 LAB — CBC WITH DIFFERENTIAL (CANCER CENTER ONLY)
Abs Immature Granulocytes: 0.03 K/uL (ref 0.00–0.07)
Basophils Absolute: 0 K/uL (ref 0.0–0.1)
Basophils Relative: 1 %
Eosinophils Absolute: 0.1 K/uL (ref 0.0–0.5)
Eosinophils Relative: 2 %
HCT: 36.1 % — ABNORMAL LOW (ref 39.0–52.0)
Hemoglobin: 12.2 g/dL — ABNORMAL LOW (ref 13.0–17.0)
Immature Granulocytes: 1 %
Lymphocytes Relative: 17 %
Lymphs Abs: 0.9 K/uL (ref 0.7–4.0)
MCH: 33 pg (ref 26.0–34.0)
MCHC: 33.8 g/dL (ref 30.0–36.0)
MCV: 97.6 fL (ref 80.0–100.0)
Monocytes Absolute: 0.6 K/uL (ref 0.1–1.0)
Monocytes Relative: 11 %
Neutro Abs: 3.7 K/uL (ref 1.7–7.7)
Neutrophils Relative %: 68 %
Platelet Count: 222 K/uL (ref 150–400)
RBC: 3.7 MIL/uL — ABNORMAL LOW (ref 4.22–5.81)
RDW: 15.3 % (ref 11.5–15.5)
WBC Count: 5.3 K/uL (ref 4.0–10.5)
nRBC: 0 % (ref 0.0–0.2)

## 2024-11-10 LAB — CMP (CANCER CENTER ONLY)
ALT: 10 U/L (ref 0–44)
AST: 16 U/L (ref 15–41)
Albumin: 4.1 g/dL (ref 3.5–5.0)
Alkaline Phosphatase: 89 U/L (ref 38–126)
Anion gap: 9 (ref 5–15)
BUN: 10 mg/dL (ref 8–23)
CO2: 27 mmol/L (ref 22–32)
Calcium: 9.4 mg/dL (ref 8.9–10.3)
Chloride: 101 mmol/L (ref 98–111)
Creatinine: 0.89 mg/dL (ref 0.61–1.24)
GFR, Estimated: >60 mL/min
Glucose, Bld: 121 mg/dL — ABNORMAL HIGH (ref 70–99)
Potassium: 4.1 mmol/L (ref 3.5–5.1)
Sodium: 137 mmol/L (ref 135–145)
Total Bilirubin: 0.4 mg/dL (ref 0.0–1.2)
Total Protein: 6.9 g/dL (ref 6.5–8.1)

## 2024-11-10 MED ORDER — IOHEXOL 300 MG/ML  SOLN
75.0000 mL | Freq: Once | INTRAMUSCULAR | Status: AC | PRN
  Administered 2024-11-10: 75 mL via INTRAVENOUS

## 2024-11-14 ENCOUNTER — Inpatient Hospital Stay: Admitting: Internal Medicine

## 2024-11-14 VITALS — BP 98/72 | HR 60 | Temp 97.6°F | Resp 17 | Ht 70.0 in | Wt 190.0 lb

## 2024-11-14 DIAGNOSIS — C3492 Malignant neoplasm of unspecified part of left bronchus or lung: Secondary | ICD-10-CM

## 2024-11-14 DIAGNOSIS — C3490 Malignant neoplasm of unspecified part of unspecified bronchus or lung: Secondary | ICD-10-CM | POA: Diagnosis not present

## 2024-11-14 DIAGNOSIS — C3412 Malignant neoplasm of upper lobe, left bronchus or lung: Secondary | ICD-10-CM | POA: Diagnosis not present

## 2024-11-14 NOTE — Progress Notes (Signed)
 "     [facility] [phone]   Fax:([phone]) [phone]  OFFICE PROGRESS NOTE  Arloa Elsie SAUNDERS, MD [phone] [place] [address]  DIAGNOSIS:  limited stage (T1c, N2, M0) small cell lung cancer with mixture of squamous cell carcinoma in the left upper lobe presented with left upper lobe lung nodule in addition to left hilar and mediastinal lymphadenopathy diagnosed in May 2023.  PRIOR THERAPY:  1) Systemic chemotherapy with cisplatin  75 Mg/M2 on day 1 and etoposide  100 Mg/M2 on days 1, 2 and 3 every 3 weeks for 4 cycles.  This was concurrent with radiotherapy for around 6 weeks during this course of treatment.  Status post 4 cycles.  Last cycle was given on 06/09/2022. 2) prophylactic cranial irradiation under the care of Dr. Patrcia. 3) SBRT to right upper lobe lung nodule under the care of Dr. Patrcia.  CURRENT THERAPY: Observation.  INTERVAL HISTORY: Francisco Austin 74 y.o. male returns to the clinic today for 102-month follow-up visit accompanied by his wife. Discussed the use of AI scribe software for clinical note transcription with the patient, who gave verbal consent to proceed.  History of Present Illness Francisco Austin is a 74 year old male with limited stage small cell lung cancer mixed with squamous cell carcinoma who presents for restaging and evaluation of new right-sided chest and back pain.  He was diagnosed in May 2023 with limited stage small cell lung cancer mixed with squamous cell carcinoma of the left upper lobe, associated with left hilar and mediastinal lymphadenopathy. Initial treatment included four cycles of cisplatin -based chemotherapy with concurrent thoracic radiation, followed by prophylactic cranial irradiation. He subsequently received stereotactic body radiation therapy to a right upper lobe lung nodule, which was biopsied and found to have atypical cells but was not definitively diagnostic due to technical limitations.  He has been under observation since completion of these treatments.  He describes new, severe right-sided pain radiating from the lower rib cage to the back and under the right scapula, localized to the area of prior biopsy. The pain is sharp, exacerbated by deep inspiration and coughing, and described as like somebody ripped my back bone out. He has been unable to sleep in his bed for over two months and cannot lie on his right side due to the pain. Topical analgesics, including lidocaine  patches and Voltaren gel, have provided minimal relief. He denies spinal origin of the pain, instead localizing it to the right lateral chest and back. His wife corroborates the recent onset and increasing severity of the pain.  Recent chest CT demonstrated a new endobronchial mucoid area in the right lower lobe. The right upper lobe nodule remains stable in size and appearance.  He has chronic back pain and arthritis with prior lumbar spine involvement, and osteopenia. He has not had any recent biopsies since May 2023. He and his wife traveled to [place] for further evaluation and are seeking additional investigation of his pain, including consideration of MRI of the thoracic spine to assess for possible musculoskeletal or nerve involvement not visualized on CT.    MEDICAL HISTORY: Past Medical History:  Diagnosis Date   Agent orange exposure    Anxiety    Arthritis    Back pain    CAD (coronary artery disease)    Chronic chest pain    COPD (chronic obstructive pulmonary disease) (HCC)    Coronary vasospasm    Headache    cluster   History of  kidney stones    HLD (hyperlipidemia)    HLD (hyperlipidemia)    HTN (hypertension)    Lymphadenopathy, hilar    left   Mediastinal lymphadenopathy    Pre-diabetes    Small cell carcinoma (HCC)    Squamous cell carcinoma of lung (HCC)     ALLERGIES:  is allergic to gabapentin, morphine, other, prednisone, alprazolam, fentanyl , niacin, and  penicillamine.  MEDICATIONS:  Current Outpatient Medications  Medication Sig Dispense Refill   albuterol  (VENTOLIN  HFA) 108 (90 Base) MCG/ACT inhaler Inhale 2 puffs into the lungs every 6 (six) hours as needed for wheezing or shortness of breath.     aspirin  EC 81 MG tablet Take 81 mg by mouth daily.     atorvastatin  (LIPITOR ) 80 MG tablet Take 1 tablet (80 mg total) by mouth daily. 90 tablet 1   Cholecalciferol 50 MCG (2000 UT) TABS Take 2,000 Units by mouth daily.     clopidogrel  (PLAVIX ) 75 MG tablet TAKE ONE TABLET BY MOUTH ONCE DAILY WITH BREAKFAST 90 tablet 3   diclofenac sodium (VOLTAREN) 1 % GEL Apply 1 g topically daily as needed (pain).     diltiazem  (TIAZAC ) 240 MG 24 hr capsule Take 240 mg by mouth daily.     esomeprazole (NEXIUM) 40 MG capsule Take 40 mg by mouth daily.     HYDROcodone -acetaminophen  (NORCO) 10-325 MG tablet Take 1 tablet by mouth 5 (five) times daily.     LORazepam  (ATIVAN ) 1 MG tablet Take 1 mg by mouth See admin instructions. Take 1mg  (1 tablet) by mouth twice daily and 0.5mg  (1/2 tablet) in addition daily.     nitroGLYCERIN  (NITROSTAT ) 0.4 MG SL tablet Place 0.4 mg under the tongue every 5 (five) minutes as needed for chest pain.     Oxycodone HCl 10 MG TABS 1 tablet as needed Orally every 6 hrs for 30 days     ranolazine  (RANEXA ) 500 MG 12 hr tablet TAKE 1 TABLET BY MOUTH TWICE A DAY 180 tablet 3   telmisartan (MICARDIS) 80 MG tablet Take 80 mg by mouth daily.     umeclidinium-vilanterol (ANORO ELLIPTA) 62.5-25 MCG/INH AEPB Inhale 1 puff into the lungs daily.     vitamin B-12 (CYANOCOBALAMIN) 500 MCG tablet Take 500 mcg by mouth daily.     No current facility-administered medications for this visit.   Facility-Administered Medications Ordered in Other Visits  Medication Dose Route Frequency Provider Last Rate Last Admin   prochlorperazine  (COMPAZINE ) 10 MG/2ML injection             SURGICAL HISTORY:  Past Surgical History:  Procedure Laterality Date    APPENDECTOMY     BRONCHIAL BIOPSY  03/31/2024   Procedure: BRONCHOSCOPY, WITH BIOPSY;  Surgeon: Kerrin Elspeth BROCKS, MD;  Location: [facility] ENDOSCOPY;  Service: Thoracic;;   BRONCHIAL NEEDLE ASPIRATION BIOPSY  03/31/2024   Procedure: BRONCHOSCOPY, WITH NEEDLE ASPIRATION BIOPSY;  Surgeon: Kerrin Elspeth BROCKS, MD;  Location: [facility] ENDOSCOPY;  Service: Thoracic;;   BRONCHIAL WASHINGS  03/31/2024   Procedure: IRRIGATION, BRONCHUS;  Surgeon: Kerrin Elspeth BROCKS, MD;  Location: [facility] ENDOSCOPY;  Service: Thoracic;;   CARDIAC CATHETERIZATION     multiple with Dr. Blanca (no stent placed at that time)   CORONARY STENT INTERVENTION N/A 12/24/2021   Procedure: CORONARY STENT INTERVENTION;  Surgeon: Dann Candyce RAMAN, MD;  Location: [facility] INVASIVE CV LAB;  Service: Cardiovascular;  Laterality: N/A;   CORONARY ULTRASOUND/IVUS N/A 12/24/2021   Procedure: Intravascular Ultrasound/IVUS;  Surgeon: Dann Candyce RAMAN, MD;  Location:  [facility] INVASIVE CV LAB;  Service: Cardiovascular;  Laterality: N/A;   HEMORROIDECTOMY     LEFT HEART CATH AND CORONARY ANGIOGRAPHY N/A 12/23/2021   Procedure: LEFT HEART CATH AND CORONARY ANGIOGRAPHY;  Surgeon: Court Dorn PARAS, MD;  Location: [facility] INVASIVE CV LAB;  Service: Cardiovascular;  Laterality: N/A;   LEFT HEART CATH AND CORONARY ANGIOGRAPHY N/A 07/15/2024   Procedure: LEFT HEART CATH AND CORONARY ANGIOGRAPHY;  Surgeon: Anner Alm ORN, MD;  Location: [facility] INVASIVE CV LAB;  Service: Cardiovascular;  Laterality: N/A;   TONSILLECTOMY     VIDEO BRONCHOSCOPY WITH ENDOBRONCHIAL NAVIGATION N/A 03/31/2024   Procedure: VIDEO BRONCHOSCOPY WITH ENDOBRONCHIAL NAVIGATION;  Surgeon: Kerrin Elspeth BROCKS, MD;  Location: [facility] ENDOSCOPY;  Service: Thoracic;  Laterality: N/A;  robotic bronchoscopy for biopsy of right upper and left upper lobe nodules   VIDEO BRONCHOSCOPY WITH ENDOBRONCHIAL ULTRASOUND N/A 03/12/2022   Procedure: VIDEO BRONCHOSCOPY WITH ENDOBRONCHIAL ULTRASOUND;  Surgeon: Kerrin Elspeth BROCKS, MD;  Location: [facility] OR;  Service: Thoracic;  Laterality: N/A;    REVIEW OF SYSTEMS:  Constitutional: positive for fatigue Eyes: negative Ears, nose, mouth, throat, and face: negative Respiratory: positive for cough and pleurisy/chest pain Cardiovascular: negative Gastrointestinal: negative Genitourinary:negative Integument/breast: negative Hematologic/lymphatic: negative Musculoskeletal:positive for back pain Neurological: negative Behavioral/Psych: negative Endocrine: negative Allergic/Immunologic: negative   PHYSICAL EXAMINATION: General appearance: alert, cooperative, fatigued, and no distress Head: Normocephalic, without obvious abnormality, atraumatic Neck: no adenopathy, no JVD, supple, symmetrical, trachea midline, and thyroid  not enlarged, symmetric, no tenderness/mass/nodules Lymph nodes: Cervical, supraclavicular, and axillary nodes normal. Resp: clear to auscultation bilaterally Back: symmetric, no curvature. ROM normal. No CVA tenderness. Cardio: regular rate and rhythm, S1, S2 normal, no murmur, click, rub or gallop GI: soft, non-tender; bowel sounds normal; no masses,  no organomegaly Extremities: extremities normal, atraumatic, no cyanosis or edema Neurologic: Alert and oriented X 3, normal strength and tone. Normal symmetric reflexes. Normal coordination and gait  ECOG PERFORMANCE STATUS: 1 - Symptomatic but completely ambulatory  Blood pressure 98/72, pulse 60, temperature 97.6 F (36.4 C), temperature source Temporal, resp. rate 17, height 5' 10 (1.778 m), weight 190 lb (86.2 kg), SpO2 95%.  LABORATORY DATA: Lab Results  Component Value Date   WBC 5.3 11/10/2024   HGB 12.2 (L) 11/10/2024   HCT 36.1 (L) 11/10/2024   MCV 97.6 11/10/2024   PLT 222 11/10/2024      Chemistry      Component Value Date/Time   NA 137 11/10/2024 0904   NA 142 07/13/2024 1136   K 4.1 11/10/2024 0904   CL 101 11/10/2024 0904   CO2 27 11/10/2024 0904   BUN 10  11/10/2024 0904   BUN 9 07/13/2024 1136   CREATININE 0.89 11/10/2024 0904      Component Value Date/Time   CALCIUM  9.4 11/10/2024 0904   ALKPHOS 89 11/10/2024 0904   AST 16 11/10/2024 0904   ALT 10 11/10/2024 0904   BILITOT 0.4 11/10/2024 0904       RADIOGRAPHIC STUDIES: CT Chest W Contrast Result Date: 11/11/2024 EXAM: CT CHEST WITH CONTRAST 11/10/2024 10:50:30 AM TECHNIQUE: CT of the chest was performed with the administration of 75 mL of iohexol  (OMNIPAQUE ) 300 MG/ML solution. Multiplanar reformatted images are provided for review. Automated exposure control, iterative reconstruction, and/or weight based adjustment of the mA/kV was utilized to reduce the radiation dose to as low as reasonably achievable. COMPARISON: 08/08/2024 CLINICAL HISTORY: Non-small cell lung cancer (NSCLC), staging. Non-small cell lung cancer (NSCLC), staging. * Tracking Code: BO * .  FINDINGS: MEDIASTINUM: mild cardiomegaly. Lipomatous hypertrophy of the anterior septum. 4 vessel coronary artery calcification. No central pulmonary embolism on this non-dedicated exam. Enlarged pulmonary arteries, including a 3.1 cm right pulmonary artery. Aortic atherosclerosis. The central airways are clear. Esophageal fluid level on image 60/2. LYMPH NODES: 11 mm subcarinal node measured 13 mm on the prior, upper normal. No hilar or axillary lymphadenopathy. LUNGS AND PLEURA: Mild right hemidiaphragm elevation. Moderate centrilobular emphysema. Lower lobe predominant bronchial wall thickening with right lower lobe mucoid impaction medially, new. Spiculated right upper lobe pulmonary nodule measures 1.4 x 1.0 cm on image 35/5. When remeasured in a similar fashion on the prior, 1.5 x 1.2 cm on that exam, suggesting stability. New Right lower lobe dependent airspace disease. The area of anterior left upper lobe mixed attenuation, favored to represent bronchiectasis and surrounding interstitial thickening is felt to be similar. Exam at 2.8 x 2.4  cm on image 60/5. SABRA Similar appearance of left perihilar presumably radiation induced soft tissue thickening, without well defined local recurrence or residual disease. No pneumothorax or pleural fluid. SOFT TISSUES/BONES: Osteopenia. No acute abnormality of the soft tissues. UPPER ABDOMEN: Bilateral, right larger than left renal collecting system calculi up to 7 mm. Left greater than right adrenal thickening and mild nodularity, similar with maintenance of adreniform shape. Limited images of the upper abdomen demonstrates no acute abnormality. IMPRESSION: 1. Left perihilar presumably radiation induced soft tissue thickening, without local recurrence. 2. Similar spiculated right upper lobe pulmonary nodule, suspicious for primary bronchogenic carcinoma. 3. New right lower lobe mild airspace disease with endobronchial mucoid filling, suspicious for infection or aspiration. 4. Similar anterior left upper lobe area of probable bronchiectasis and interstitial thickening.mixed attenuation nodule felt less likely.recommend attention on follow up. 5. Decreased size of an upper normal subcarinal node , reactive versus response to therapy of nodal metastases. 6. Bilateral nephrolithiasis. 7. Pulmonary artery enlargement suggests pulmonary arterial hypertension. 8. Aortic atherosclerosis (icd10-i70.0), coronary artery atherosclerosis, and emphysema (icd10-j43.9). Electronically signed by: Rockey Kilts MD MD 11/11/2024 05:27 PM EST RP Workstation: [id number]   CT ABDOMEN PELVIS W CONTRAST Result Date: 10/31/2024 EXAM: CT ABDOMEN AND PELVIS WITH CONTRAST 10/31/2024 12:12:48 PM TECHNIQUE: CT of the abdomen and pelvis was performed with the administration of 75 mL iopamidol  (ISOVUE -370) 76 % injection. Multiplanar reformatted images are provided for review. Automated exposure control, iterative reconstruction, and/or weight-based adjustment of the mA/kV was utilized to reduce the radiation dose to as low as reasonably  achievable. COMPARISON: CT chest, abdomen and pelvis dated 07/20/2023. CLINICAL HISTORY: Non small cell lung cancer. Abnormal stool test. * Tracking Code: BO * FINDINGS: LOWER CHEST: Coronary atherosclerosis. LIVER: The liver is unremarkable. GALLBLADDER AND BILE DUCTS: Gallbladder is unremarkable. No biliary ductal dilatation. SPLEEN: No acute abnormality. PANCREAS: No acute abnormality. ADRENAL GLANDS: Stable mildly thickened adrenal glands without discrete adrenal nodules, left greater than right, favoring mild chronic adrenal hyperplasia. KIDNEYS, URETERS AND BLADDER: Multiple nonobstructing right renal stones, largest 11 mm in the lower right kidney and 8 mm in the right renal pelvis. Mild wall thickening throughout the right renal pelvis with surrounding fat stranding, new. Small simple scattered right renal cysts up to 1.4 cm in the lower pole of the right kidney. Per consensus, no follow-up is needed for simple Bosniak type 1 and 2 renal cysts, unless the patient has a malignancy history or risk factors. Nonobstructing 2 mm upper left renal stone. No hydronephrosis. No perinephric or periureteral stranding for the left kidney. Urinary bladder is unremarkable.  GI AND BOWEL: Stomach demonstrates no acute abnormality. Appendix not discretely visualized. No pericecal inflammatory changes. Oral contrast transits to the right colon. Normal caliber small bowel. No small bowel wall thickening. Moderate diffuse colonic stool. No large bowel wall thickening or significant pericolonic fat stranding. No significant colonic diverticulosis. PERITONEUM AND RETROPERITONEUM: No ascites. No free air. VASCULATURE: Atherosclerotic nonaneurysmal abdominal aorta. LYMPH NODES: No lymphadenopathy. REPRODUCTIVE ORGANS: No acute abnormality. BONES AND SOFT TISSUES: No acute osseous abnormality. No focal soft tissue abnormality. IMPRESSION: 1. No evidence of metastatic disease in the abdomen or pelvis. 2. No acute bowel abnormality.  Moderate colonic stool. 3. Multiple nonobstructing right renal stones, largest 11 mm in the lower right kidney and 8 mm in the right renal pelvis, without hydronephrosis. New nonspecific mild wall thickening throughout the right renal pelvis with surrounding fat stranding, presumably inflammatory related to the renal pelvis stone. 4. Nonobstructing 2 mm upper left renal stone. 5. Mild chronic adrenal hyperplasia. 6. Coronary atherosclerosis. 7. Aortic Atherosclerosis (ICD10-I70.0). Electronically signed by: Selinda Blue MD 10/31/2024 02:03 PM EST RP Workstation: [id number]      ASSESSMENT AND PLAN: This is a very pleasant 74 years old white male diagnosed with limited stage (T1c, N2, M0) small cell lung cancer with mixture of squamous cell carcinoma in the left upper lobe presented with left upper lobe lung nodule in addition to left hilar and mediastinal lymphadenopathy diagnosed in May 2023.  The patient underwent systemic chemotherapy with cisplatin  75 Mg/M2 on day 1 and etoposide  100 Mg/M2 on days 1, 2 and 3 status post 4 cycles.  This is concurrent with radiotherapy.  Last dose of chemotherapy was on 06/09/2022.  This was followed by prophylactic cranial irradiation. He was found to have part solid right upper lobe nodule and bronchoscopy revealed atypical cells suspicious for lung cancer. He underwent SBRT to the right upper lobe lung nodule under the care of Dr. Patrcia. The patient is currently on observation.   The patient had repeat CT scan of the chest, abdomen and pelvis performed recently.  I personally independently reviewed the scan images and discussed the results with the patient and his wife.  His scan showed no concerning findings for disease progression and he has a stable right upper lobe lung nodule. Assessment and Plan Assessment & Plan Limited stage small cell carcinoma and squamous cell carcinoma of the left upper lobe with left hilar and mediastinal lymphadenopathy, status post  chemotherapy and radiation No significant change in the left upper lobe or mediastinum on recent chest CT following cisplatin -based chemotherapy, concurrent radiation, and prophylactic cranial irradiation. - Continued surveillance with scheduled follow-up in four months.  Right upper lobe lung nodule, suspected malignancy, post-SBRT, under active surveillance The right upper lobe nodule, previously treated with SBRT for biopsy-proven atypical cells suspicious for malignancy, has remained stable on serial imaging with no evidence of progression or new concerning features on recent CT. - Maintained active surveillance of the right upper lobe nodule with follow-up in four months.  Chronic thoracic and right-sided chest wall pain He reports new and worsening right-sided chest wall and thoracic pain radiating to the back and under the scapula, not explained by recent chest CT. Differential includes musculoskeletal pain, possible nerve involvement, or pathology not visualized on CT. He has osteopenia and lumbar spine pathology. Current analgesics have provided insufficient relief. - Ordered MRI of the thoracic spine to further evaluate etiology of pain. - Continued current analgesic regimen (lidocaine  patch, diclofenac). - If MRI is negative, no  further oncologic intervention planned for pain. He also has kidney stones that may need evaluation by urology. He was advised to call immediately if he has any other concerning symptoms in the referral.   The patient voices understanding of current disease status and treatment options and is in agreement with the current care plan.  All questions were answered. The patient knows to call the clinic with any problems, questions or concerns. We can certainly see the patient much sooner if necessary.  The total time spent in the appointment was 30 minutes including review of chart and various tests results, discussions about plan of care and coordination of care  plan .   Disclaimer: This note was dictated with voice recognition software. Similar sounding words can inadvertently be transcribed and may not be corrected upon review.        "

## 2024-11-15 ENCOUNTER — Inpatient Hospital Stay: Admitting: Internal Medicine

## 2024-11-18 ENCOUNTER — Telehealth: Payer: Self-pay | Admitting: Cardiovascular Disease

## 2024-11-18 ENCOUNTER — Other Ambulatory Visit: Payer: Self-pay | Admitting: Urology

## 2024-11-18 NOTE — Telephone Encounter (Signed)
"  ° °  Pre-operative Risk Assessment    Patient Name: Francisco Austin  DOB: 07-25-1951 MRN: [id number]   Date of last office visit: 09/12/24 Date of next office visit: TBD    Request for Surgical Clearance    Procedure:  right ureteroscopy and laser of stone   Date of Surgery:  Clearance 12/09/24                                Surgeon:  Dr. Sherwood Edison  Surgeon's Group or Practice Name:  [facility]  Phone number:  [phone] Fax number:  [phone]   Type of Clearance Requested:   - Medical  - Pharmacy:  Hold Clopidogrel  (Plavix ) 5   Type of Anesthesia:  General    Additional requests/questions:    Bonney Larraine Salt   11/18/2024, 3:34 PM   "

## 2024-11-18 NOTE — Telephone Encounter (Signed)
 Hey Dr. Court. Patient has up coming procedure planned for kidney stone and surgeon is requesting to hold Plavix . He has a history of CAD and underwent DES x2 to RCA and DES x1 to LAD. Most recent cath in 07/2024 showed patent stents with a stable 80% ostial lesion of a jailed small caliber 1st Diag. Continue medical therapy was recommended at that time. Can you please provide recommendations for holding Plavix ?  Please route response back to P CV DIV PREOP.  Thank you! Amberlyn Martinezgarcia

## 2024-11-19 NOTE — Telephone Encounter (Signed)
" ° °  Name: Francisco Austin  DOB: [birth year]  MRN: [id number]  Primary Cardiologist: Dorn Lesches, MD   Preoperative team, please contact this patient and set up a phone call appointment for further preoperative risk assessment. Please obtain consent and complete medication review. Thank you for your help.  I confirm that guidance regarding antiplatelet and oral anticoagulation therapy has been completed and, if necessary, noted below.  Per Dr. Lesches 11/19/2024 OK to hold plavix    JJB   I also confirmed the patient resides in the state of [place] . As per [facility] [employer] telemedicine laws, the patient must reside in the state in which the provider is licensed.   Lamarr Satterfield, NP 11/19/2024, 1:29 PM [facility]    "

## 2024-11-21 ENCOUNTER — Telehealth (HOSPITAL_BASED_OUTPATIENT_CLINIC_OR_DEPARTMENT_OTHER): Payer: Self-pay | Admitting: *Deleted

## 2024-11-21 ENCOUNTER — Telehealth: Payer: Self-pay

## 2024-11-21 NOTE — Telephone Encounter (Signed)
 Spoke with patient in regards to upcoming surgery (possible stent placement) with Dr. Carolee in urology. He states that he was calling to inform Dr. Sherrod. Advised patient that information will be relayed to Dr. Sherrod.  He voiced understanding.

## 2024-11-21 NOTE — Telephone Encounter (Signed)
 Pt has been scheduled tele preop appt 11/25/24, med rec and consent are done.

## 2024-11-21 NOTE — Telephone Encounter (Signed)
 Pt has been scheduled tele preop appt 11/25/24, med rec and consent are done.      Patient Consent for Virtual Visit        Francisco Austin has provided verbal consent on 11/21/2024 for a virtual visit (video or telephone).   CONSENT FOR VIRTUAL VISIT FOR:  Francisco Austin  By participating in this virtual visit I agree to the following:  I hereby voluntarily request, consent and authorize [facility] and its employed or contracted physicians, physician assistants, nurse practitioners or other licensed health care professionals (the Practitioner), to provide me with telemedicine health care services (the Services) as deemed necessary by the treating Practitioner. I acknowledge and consent to receive the Services by the Practitioner via telemedicine. I understand that the telemedicine visit will involve communicating with the Practitioner through live audiovisual communication technology and the disclosure of certain medical information by electronic transmission. I acknowledge that I have been given the opportunity to request an in-person assessment or other available alternative prior to the telemedicine visit and am voluntarily participating in the telemedicine visit.  I understand that I have the right to withhold or withdraw my consent to the use of telemedicine in the course of my care at any time, without affecting my right to future care or treatment, and that the Practitioner or I may terminate the telemedicine visit at any time. I understand that I have the right to inspect all information obtained and/or recorded in the course of the telemedicine visit and may receive copies of available information for a reasonable fee.  I understand that some of the potential risks of receiving the Services via telemedicine include:  Delay or interruption in medical evaluation due to technological equipment failure or disruption; Information transmitted may not be sufficient (e.g. poor  resolution of images) to allow for appropriate medical decision making by the Practitioner; and/or  In rare instances, security protocols could fail, causing a breach of personal health information.  Furthermore, I acknowledge that it is my responsibility to provide information about my medical history, conditions and care that is complete and accurate to the best of my ability. I acknowledge that Practitioner's advice, recommendations, and/or decision may be based on factors not within their control, such as incomplete or inaccurate data provided by me or distortions of diagnostic images or specimens that may result from electronic transmissions. I understand that the practice of medicine is not an exact science and that Practitioner makes no warranties or guarantees regarding treatment outcomes. I acknowledge that a copy of this consent can be made available to me via my patient portal [facility] MyChart), or I can request a printed copy by calling the office of [facility].    I understand that my insurance will be billed for this visit.   I have read or had this consent read to me. I understand the contents of this consent, which adequately explains the benefits and risks of the Services being provided via telemedicine.  I have been provided ample opportunity to ask questions regarding this consent and the Services and have had my questions answered to my satisfaction. I give my informed consent for the services to be provided through the use of telemedicine in my medical care

## 2024-11-25 ENCOUNTER — Ambulatory Visit: Attending: Cardiovascular Disease | Admitting: Student

## 2024-11-25 DIAGNOSIS — Z01810 Encounter for preprocedural cardiovascular examination: Secondary | ICD-10-CM

## 2024-11-25 NOTE — Progress Notes (Signed)
 "   Virtual Visit via Telephone Note   Because of Francisco Austin's co-morbid illnesses, he is at least at moderate risk for complications without adequate follow up.  This format is felt to be most appropriate for this patient at this time.  The patient did not have access to video technology/had technical difficulties with video requiring transitioning to audio format only (telephone).  All issues noted in this document were discussed and addressed.  No physical exam could be performed with this format.  Please refer to the patient's chart for his consent to telehealth for [facility].  Evaluation Performed:  Preoperative cardiovascular risk assessment _____________   Date:  11/25/2024   Patient ID:  Francisco Austin, DOB 02-Jul-1951, MRN [id number] Patient Location:  Home Provider location:   Office  Primary Care Provider:  Arloa Elsie SAUNDERS, MD  Primary Cardiologist:  [facility] Providers Cardiologist:  Dorn Lesches, MD    Chief Complaint / Patient Profile   74 y.o. y/o male with a h/o CAD s/p PCI with DES to LAD and overlapping DES to mid RCA February 2023, heart catheterization September 2025 demonstrated patent stents and stable CAD, hypertension, hyperlipidemia, COPD, lung cancer, GERD, T2DM, tobacco abuse who is pending right ureteroscopy and laser of stone by Dr. Carolee and presents today for telephonic preoperative cardiovascular risk assessment.  History of Present Illness    Francisco Austin is a 74 y.o. male who presents via audio/video conferencing for a telehealth visit today.  Pt was last seen in cardiology clinic on 09/12/2024 by Dr. Lesches.  At that time Francisco Austin was stable from a cardiac standpoint.  The patient is now pending procedure as outlined above. Since his last visit, he continues to have chest pain and shortness of breath. He feels this is driven by his lung issues. He reports pain is like a tight band around his chest. He uses Voltaren  cream which helps ease the discomfort. This chest discomfort is similar to pain he had in the Fall when he underwent LHC which showed stable CAD. He has occasional lower extremity edema that he manages with compression socks. He denies orthopnea or PND. He is independent with ADLs and able to perform light to moderate household activities.   Past Medical History    Past Medical History:  Diagnosis Date   Agent orange exposure    Anxiety    Arthritis    Back pain    CAD (coronary artery disease)    Chronic chest pain    COPD (chronic obstructive pulmonary disease) (HCC)    Coronary vasospasm    Headache    cluster   History of kidney stones    HLD (hyperlipidemia)    HLD (hyperlipidemia)    HTN (hypertension)    Lymphadenopathy, hilar    left   Mediastinal lymphadenopathy    Pre-diabetes    Small cell carcinoma (HCC)    Squamous cell carcinoma of lung (HCC)    Past Surgical History:  Procedure Laterality Date   APPENDECTOMY     BRONCHIAL BIOPSY  03/31/2024   Procedure: BRONCHOSCOPY, WITH BIOPSY;  Surgeon: Kerrin Elspeth BROCKS, MD;  Location: [facility] ENDOSCOPY;  Service: Thoracic;;   BRONCHIAL NEEDLE ASPIRATION BIOPSY  03/31/2024   Procedure: BRONCHOSCOPY, WITH NEEDLE ASPIRATION BIOPSY;  Surgeon: Kerrin Elspeth BROCKS, MD;  Location: [facility] ENDOSCOPY;  Service: Thoracic;;   BRONCHIAL WASHINGS  03/31/2024   Procedure: IRRIGATION, BRONCHUS;  Surgeon: Kerrin Elspeth BROCKS, MD;  Location: [facility] ENDOSCOPY;  Service: Thoracic;;   CARDIAC CATHETERIZATION     multiple with Dr. Blanca (no stent placed at that time)   CORONARY STENT INTERVENTION N/A 12/24/2021   Procedure: CORONARY STENT INTERVENTION;  Surgeon: Dann Candyce RAMAN, MD;  Location: [facility] INVASIVE CV LAB;  Service: Cardiovascular;  Laterality: N/A;   CORONARY ULTRASOUND/IVUS N/A 12/24/2021   Procedure: Intravascular Ultrasound/IVUS;  Surgeon: Dann Candyce RAMAN, MD;  Location: [facility] INVASIVE CV LAB;  Service: Cardiovascular;  Laterality:  N/A;   HEMORROIDECTOMY     LEFT HEART CATH AND CORONARY ANGIOGRAPHY N/A 12/23/2021   Procedure: LEFT HEART CATH AND CORONARY ANGIOGRAPHY;  Surgeon: Court Dorn PARAS, MD;  Location: [facility] INVASIVE CV LAB;  Service: Cardiovascular;  Laterality: N/A;   LEFT HEART CATH AND CORONARY ANGIOGRAPHY N/A 07/15/2024   Procedure: LEFT HEART CATH AND CORONARY ANGIOGRAPHY;  Surgeon: Anner Francisco ORN, MD;  Location: [facility] INVASIVE CV LAB;  Service: Cardiovascular;  Laterality: N/A;   TONSILLECTOMY     VIDEO BRONCHOSCOPY WITH ENDOBRONCHIAL NAVIGATION N/A 03/31/2024   Procedure: VIDEO BRONCHOSCOPY WITH ENDOBRONCHIAL NAVIGATION;  Surgeon: Kerrin Elspeth BROCKS, MD;  Location: [facility] ENDOSCOPY;  Service: Thoracic;  Laterality: N/A;  robotic bronchoscopy for biopsy of right upper and left upper lobe nodules   VIDEO BRONCHOSCOPY WITH ENDOBRONCHIAL ULTRASOUND N/A 03/12/2022   Procedure: VIDEO BRONCHOSCOPY WITH ENDOBRONCHIAL ULTRASOUND;  Surgeon: Kerrin Elspeth BROCKS, MD;  Location: [facility] OR;  Service: Thoracic;  Laterality: N/A;    Allergies  Allergies[1]  Home Medications    Prior to Admission medications  Medication Sig Start Date End Date Taking? Authorizing Provider  albuterol  (VENTOLIN  HFA) 108 (90 Base) MCG/ACT inhaler Inhale 2 puffs into the lungs every 6 (six) hours as needed for wheezing or shortness of breath. 07/17/21   [provider]  aspirin  EC 81 MG tablet Take 81 mg by mouth daily.    [provider]  atorvastatin  (LIPITOR ) 80 MG tablet Take 1 tablet (80 mg total) by mouth daily. 02/26/23   Court Dorn PARAS, MD  Cholecalciferol 50 MCG (2000 UT) TABS Take 2,000 Units by mouth daily. 03/08/21   [provider]  clopidogrel  (PLAVIX ) 75 MG tablet TAKE ONE TABLET BY MOUTH ONCE DAILY WITH BREAKFAST 12/03/22   Court Dorn PARAS, MD  diclofenac sodium (VOLTAREN) 1 % GEL Apply 1 g topically daily as needed (pain).    [provider]  diltiazem  (TIAZAC ) 240 MG 24 hr capsule Take 240 mg  by mouth daily. 12/12/21   [provider]  esomeprazole (NEXIUM) 40 MG capsule Take 40 mg by mouth daily.    [provider]  HYDROcodone -acetaminophen  (NORCO) 10-325 MG tablet Take 1 tablet by mouth 5 (five) times daily. 06/06/22   [provider]  LORazepam  (ATIVAN ) 1 MG tablet Take 1 mg by mouth See admin instructions. Take 1mg  (1 tablet) by mouth twice daily and 0.5mg  (1/2 tablet) in addition daily. 10/24/16   [provider]  nitroGLYCERIN  (NITROSTAT ) 0.4 MG SL tablet Place 0.4 mg under the tongue every 5 (five) minutes as needed for chest pain. 09/11/16   [provider]  Oxycodone HCl 10 MG TABS 1 tablet as needed Orally every 6 hrs for 30 days 04/06/24   [provider]  ranolazine  (RANEXA ) 500 MG 12 hr tablet TAKE 1 TABLET BY MOUTH TWICE A DAY 08/08/24   Wyn Jackee VEAR Mickey., NP  telmisartan (MICARDIS) 80 MG tablet Take 80 mg by mouth daily.    [provider]  umeclidinium-vilanterol (ANORO ELLIPTA) 62.5-25 MCG/INH AEPB  Inhale 1 puff into the lungs daily.    [provider]  vitamin B-12 (CYANOCOBALAMIN) 500 MCG tablet Take 500 mcg by mouth daily. 03/08/21   [provider]    Physical Exam    Vital Signs:  Francisco Austin does not have vital signs available for review today.  Given telephonic nature of communication, physical exam is limited. AAOx3. NAD. Normal affect.  Speech and respirations are unlabored.  Accessory Clinical Findings    Cardiac Studies & Procedures   ______________________________________________________________________________________________ CARDIAC CATHETERIZATION  CARDIAC CATHETERIZATION 07/15/2024  Conclusion Table formatting from the original result was not included. Images from the original result were not included.    Angiographically stable two-vessel CAD with widely patent stents in the LAD and RCA with mild disease elsewhere:   Previously placed c Prox LAD stent is widely  patent with stable 80% ostial lesion in the jailed small caliber 1st Diag   Previously placed mid RCA (mid-distal) stent is widely patent. Previously placed overlapping prox RCA to Mid RCA lesion is widely patent with no residual intramural hematoma.   LV end diastolic pressure is normal. There is no aortic valve stenosis.  Dominance: Right  RECOMMENDATIONS   In the absence of any other complications or medical issues, we expect the patient to be ready for discharge from a cath perspective on 07/15/2024.   Evaluate for noncardiac etiology for chest pain   Continue clopidogrel     Francisco MICAEL Clay, MD, MS Francisco Austin, M.D., M.S. Interventional Cardiologist [facility] Pager # [phone]  Findings Coronary Findings Diagnostic  Dominance: Right  Left Anterior Descending Non-stenotic Prox LAD lesion with 80% stenosed side branch in 1st Diag was previously treated. The lesion is located at the bifurcation and focal. Previously placed stent displays no restenosis. Stable ostial diagonal (very small caliber vessel) disease with TIMI-3 flow  First Diagonal Branch Vessel is small in size.  Right Coronary Artery Non-stenotic Prox RCA to Mid RCA lesion was previously treated. The lesion is calcified. The previously noted dye staining outside of the proximal portion of the vessel is no longer present.  There is a mall branch there that was likely source of the dye stain. Non-stenotic Mid RCA lesion was previously treated. The lesion is segmental.  Right Posterior Descending Artery Vessel is small in size.  Intervention  No interventions have been documented.   CARDIAC CATHETERIZATION  CARDIAC CATHETERIZATION 12/24/2021  Conclusion   Prox LAD lesion is 90% stenosed.   A drug-eluting stent was successfully placed using a STENT ONYX FRONTIER 3.5X12 and optimized with intravascular ultrasound.   Post intervention, there is a 0% residual stenosis.   Prox RCA to Mid RCA  lesion is 80% stenosed.   Mid RCA lesion is 75% stenosed.  The RCA disease was treated with orbital atherectomy in the proximal to mid vessel   A drug-eluting stent was successfully placed using a SYNERGY XD 4.0X38 distally.   A drug-eluting stent was successfully placed using a SYNERGY XD 3.50X24 overlapping proximally.  There appeared to be some dye behind the stent.  This was confirmed by intravascular ultrasound.  Echocardiogram did not show any perforation.   Post intervention, there is a 0% residual stenosis.   Post intervention, there is a 0% residual stenosis.  Complex intervention of the RCA.  Successful PCI of the LAD.  He will need dual antiplatelet therapy for at least 6 months.  I would strongly consider lifelong clopidogrel  monotherapy given the calcific disease he has in  his right coronary artery.  Continue aggressive secondary prevention.  Findings Coronary Findings Diagnostic  Dominance: Right  Left Anterior Descending Prox LAD lesion is 90% stenosed.  Right Coronary Artery Prox RCA to Mid RCA lesion is 80% stenosed. The lesion is calcified. Mid RCA lesion is 75% stenosed.  Intervention  Prox LAD lesion Stent CATH LAUNCHER 6FR EBU3.5 guide catheter was inserted. Lesion crossed with guidewire using a WIRE ASAHI PROWATER 180CM. Pre-stent angioplasty was performed using a BALLN SCOREFLEX 2.50X10. A drug-eluting stent was successfully placed using a STENT ONYX FRONTIER 3.5X12. Stent strut is well apposed. Post-stent angioplasty was not performed. TIMI-3 flow maintained in the diagonal which was jailed. Post-Intervention Lesion Assessment The intervention was successful. Pre-interventional TIMI flow is 3. Post-intervention TIMI flow is 3. No complications occurred at this lesion. There is a 0% residual stenosis post intervention.  Prox RCA to Mid RCA lesion Atherectomy CATHETER LAUNCHER 6FR AL1 guide catheter was inserted. WIRE VIPERWIRE COR FLEX .012 guidewire was used  to cross lesion. Orbital atherectomy was performed using a CROWN DIAMONDBACK CLASSIC 1.25. Stent CATHETER LAUNCHER 6FR AL1 guide catheter was inserted. Lesion crossed with guidewire using a WIRE ASAHI PROWATER 300CM. Pre-stent angioplasty was performed using a BALLN SAPPHIRE 3.0X20. A drug-eluting stent was successfully placed using a SYNERGY XD 3.50X24. Stent strut is well apposed. Post-stent angioplasty was performed using a BALLN EUPHORA RX [id number]. Ablations.  When there was concern for possible perforation, this balloon was inflated several times in the proximal RCA.  Echocardiogram confirmed that there is no perforation.  The other possibility was that this was some sort of intramural hematoma behind the stent.  I did not want to push any hematoma potentially proximal or distally so a more aggressive postdilatation was not performed. Post-Intervention Lesion Assessment The intervention was successful. Pre-interventional TIMI flow is 3. Post-intervention TIMI flow is 3. No complications occurred at this lesion. Ultrasound (IVUS) was performed on the lesion post PCI using a CATH OPTICROSS HD. Stent well apposed. Lesion characteristics:  calcified. There is a 0% residual stenosis post intervention.  Mid RCA lesion Stent CATHETER LAUNCHER 6FR AL1 guide catheter was inserted. Lesion crossed with guidewire using a WIRE VIPERWIRE COR FLEX .012. Pre-stent angioplasty was performed using a BALLN SAPPHIRE 3.0X20. A drug-eluting stent was successfully placed using a SYNERGY XD 4.0X38. After atherectomy, the Viper wire was changed out for a long Prowater wire through a TelePort catheter. Stent strut is well apposed. Post-stent angioplasty was not performed. Intravascular ultrasound was performed showing that the stent was well apposed. Post-Intervention Lesion Assessment The intervention was successful. Pre-interventional TIMI flow is 3. Post-intervention TIMI flow is 3. No complications occurred at this  lesion. There is a 0% residual stenosis post intervention.   STRESS TESTS  MYOCARDIAL PERFUSION IMAGING 03/25/2024  Interpretation Summary   The study is normal. The study is low risk.   No ST deviation was noted.   LV perfusion is normal.   Left ventricular function is normal. Nuclear stress EF: 74%. The left ventricular ejection fraction is hyperdynamic (>65%). End diastolic cavity size is normal. End systolic cavity size is normal.   CT images were obtained for attenuation correction and were examined for the presence of coronary calcium  when appropriate.   Coronary calcium  assessment not performed due to prior revascularization.   Prior study not available for comparison.   Electronically signed by Jerel Balding, MD  Low risk stress nuclear study with normal perfusion and normal left ventricular regional and global systolic function.  ECHOCARDIOGRAM  ECHOCARDIOGRAM LIMITED 12/24/2021  Narrative ECHOCARDIOGRAM LIMITED REPORT    Patient Name:   Francisco Austin Date of Exam: 12/24/2021 Medical Rec #:  [id number]      Height:       71.0 in Accession #:    [id number]     Weight:       201.3 lb Date of Birth:  [birth year]      BSA:          2.114 m Patient Age:    70 years       BP:           122/78 mmHg Patient Gender: M              HR:           69 bpm. Exam Location:  Inpatient  Procedure: Limited Echo  STAT ECHO  Dr. Francyne in room.  Indications:    STAT rule out Pericardial Effusion  History:        Patient has prior history of Echocardiogram examinations, most recent 04/06/2017. CAD; Risk Factors:Hypertension and Dyslipidemia.  Sonographer:    Lauraine Pilot RDCS Referring Phys: DAVE CANDYCE GORMAN DANN   Conclusion(s)/Recommendation(s): Limited periprocedural study shows no evidence of pericardial effusion.  Jerel Francyne MD Electronically signed by Jerel Francyne MD Signature Date/Time: 12/24/2021/3:37:28 PM    Final      CT SCANS  CT CORONARY  FRACTIONAL FLOW RESERVE DATA PREP 12/16/2021  Narrative EXAM: CT FFR ANALYSIS  CLINICAL DATA:  Chest pain/anginal equiv, intermediate CAD risk, not treadmill candidate  FINDINGS: FFRct analysis was performed on the original cardiac CT angiogram dataset. Diagrammatic representation of the FFRct analysis is provided in a separate PDF document in PACS. This dictation was created using the PDF document and an interactive 3D model of the results. 3D model is not available in the EMR/PACS. Normal FFR range is >0.80.  1. Left Main:  No significant stenosis. FFR = 1.00  2. LAD: Significant stenosis. Proximal FFR = 1.00, Mid FFR = 0.83, Distal FFR = 0.72 (tapering) 3. LCX: Significant stenosis. Proximal FFR = 0.98, Distal FFR = 0.77 (tapering) 4. RCA: Significant stenosis. Proximal FFR = 0.99, Mid FFR = 0.77, Distal FFR = 0.77  IMPRESSION: 1. CT FFR analysis did show significant stenosis of the RCA, which is discrete in the proximal vessel and flow-limiting.  2. The are also mid to distal flow-limiting stenoses of the LAD and LCX, which taper toward the distal vessel, but may be significant.  3.  Definitive cardiac catheterization is recommended.   Electronically Signed By: Vinie JAYSON Maxcy M.D. On: 12/16/2021 13:44   CT SCANS  CT CORONARY MORPH W/CTA COR W/SCORE 12/16/2021  Addendum 12/16/2021  1:38 PM ADDENDUM REPORT: 12/16/2021 13:36  HISTORY: 74 yo male with chest pain, nonspecific  EXAM: Cardiac/Coronary CTA  TECHNIQUE: The patient was scanned on a [employer].  PROTOCOL: A 120 kV prospective scan was triggered in the descending thoracic aorta at 111 HU's. Axial non-contrast 3 mm slices were carried out through the heart. The data set was analyzed on a dedicated work station and scored using the Agatson method. Gantry rotation speed was 250 msecs and collimation was .6 mm. Beta blockade and 0.8 mg of sl NTG was given. The 3D data set was reconstructed  in 5% intervals of the 35-75 % of the R-R cycle. Diastolic phases were analyzed on a dedicated work station using MPR, MIP and VRT modes. The patient received 95mL OMNIPAQUE   IOHEXOL  350 MG/ML SOLN of contrast.  FINDINGS: Quality: Excellent.  HR 52  Coronary calcium  score: The patient's coronary artery calcium  score is 1970, which places the patient in the 93rd percentile.  Coronary arteries: Normal coronary origins.  Right dominance.  Right Coronary Artery: Dominant. Heavily calcified throughout the vessel. Probably severe proximal stenosis (70-99%, CADRADS4a).  Left Main Coronary Artery: Minimal 1-24% mixed ostial stenosis. Bifurcates into the LAD and LCx arteries.  Left Anterior Descending Coronary Artery: Anterior artery that wraps around the apex. There is mild mixed 25-49% proximal stenosis (CADRADS2). Punctate calcification is noted of the mid to distal vessel. Small proximal D1 branch with severe (70-99%, CADRADS4a) ostial stenosis - vessel is <2 mm.  Left Circumflex Artery: Heavily calcified lateral branch with moderate 50-69% proximal to mid-vessel stenosis (CADRADS3).  Aorta: Normal size, 35 mm at the mid ascending aorta (level of the PA bifurcation) measured double oblique. Aortic atherosclerosis. No dissection.  Aortic Valve: Trileaflet. No calcifications.  Other findings:  Normal pulmonary vein drainage into the left atrium.  Normal left atrial appendage without a thrombus.  Dilated main pulmonary artery at 31 mm, suggestive of pulmonary hypertension.  Posterior mitral annular calcification.  Lipomatous hypertrophy of the interatrial septum.  IMPRESSION: 1. Moderate to severe multivessel CAD, CADRADS = 4a. CT FFR will be performed and reported separately.  2. Coronary calcium  score of 1970. This was 93rd percentile for age and sex matched control.  3. Normal coronary origin with right dominance.  4. Dilated main pulmonary artery at 31 mm,  suggestive of pulmonary hypertension.  5. Posterior mitral annular calcification.  6. Lipomatous hypertrophy of the interatrial septum.  7. Aortic atherosclerosis.  8. Definitive cardiac catheterization is recommended.   Electronically Signed By: Vinie JAYSON Maxcy M.D. On: 12/16/2021 13:36  Narrative EXAM: OVER-READ INTERPRETATION  CT CHEST  The following report is an over-read performed by radiologist Dr. Newell Eke of [facility] on 12/16/2021. This over-read does not include interpretation of cardiac or coronary anatomy or pathology. The coronary calcium  score/coronary CTA interpretation by the cardiologist is attached.  COMPARISON:  CT chest 09/06/2021.  FINDINGS: Vascular: None.  Mediastinum/Nodes: 12 mm left hilar lymph node is new (10/6). 11 mm right hilar and 10 mm subcarinal lymph nodes appear similar. Esophagus is grossly unremarkable.  Lungs/Pleura: Scattered subsegmental volume loss. No pleural fluid. Debris is seen in the airway.  Upper Abdomen: None.  Musculoskeletal: No worrisome lytic or sclerotic lesions.  IMPRESSION: Enlarged left hilar lymph node, new from 09/06/2021. Consider full CT chest with contrast now or in for 3-4 weeks in further evaluation, as malignancy cannot be excluded. These results will be called to the ordering clinician or representative by the Radiologist Assistant, and communication documented in the PACS or [employer].  Electronically Signed: By: Newell Eke M.D. On: 12/16/2021 11:26     ______________________________________________________________________________________________       Assessment & Plan    Preoperative cardiovascular risk assessment. Right ureteroscopy and laser of stone by Dr. Carolee on 12/09/2024  Chart reviewed as part of pre-operative protocol coverage. According to the RCRI, patient has a 6.6% risk of MACE. Patient reports activity equivalent to 4.4 METS (per [facility]ASI).    Given past medical history and time since last visit, based on ACC/AHA guidelines, Francisco Austin would be at a moderate but acceptable risk for the planned procedure without further cardiovascular testing.   Patient was advised that if he develops new symptoms prior to surgery to contact our office to  arrange a follow-up appointment.  he verbalized understanding.  Per office protocol and Dr. Court, he may hold Plavix  for 5 days prior to procedure and should resume as soon as hemodynamically stable postoperatively.   I will route this recommendation to the requesting party via Epic fax function.  Please call with questions.  Time:   Today, I have spent 10 minutes with the patient with telehealth technology discussing medical history, symptoms, and management plan.     Barnie Hila, NP  11/25/2024, 8:53 AM     [1]  Allergies Allergen Reactions   Gabapentin Anaphylaxis and Other (See Comments)    B/P dropped to 40    Morphine Other (See Comments)    Burn his veins   Other Anaphylaxis    Androgenic Anabolic Steroid   Prednisone Anaphylaxis   Alprazolam Other (See Comments)    Other reaction(s): weirds him out   Fentanyl      Does not relieve pain for him    Niacin Other (See Comments)    Body feel weird   Penicillamine Hives   "

## 2024-12-01 ENCOUNTER — Ambulatory Visit (HOSPITAL_COMMUNITY)
Admission: RE | Admit: 2024-12-01 | Discharge: 2024-12-01 | Disposition: A | Discharge status: Home / Self Care | Source: Ambulatory Visit | Attending: Internal Medicine | Admitting: Internal Medicine

## 2024-12-01 ENCOUNTER — Other Ambulatory Visit: Payer: Self-pay | Admitting: Internal Medicine

## 2024-12-01 DIAGNOSIS — C3492 Malignant neoplasm of unspecified part of left bronchus or lung: Secondary | ICD-10-CM | POA: Insufficient documentation

## 2024-12-01 DIAGNOSIS — D1809 Hemangioma of other sites: Secondary | ICD-10-CM

## 2024-12-01 DIAGNOSIS — S22040A Wedge compression fracture of fourth thoracic vertebra, initial encounter for closed fracture: Secondary | ICD-10-CM

## 2024-12-01 MED ORDER — GADOBUTROL 1 MMOL/ML IV SOLN
8.0000 mL | Freq: Once | INTRAVENOUS | Status: AC | PRN
  Administered 2024-12-01: 8 mL via INTRAVENOUS

## 2024-12-08 ENCOUNTER — Encounter (HOSPITAL_COMMUNITY): Payer: Self-pay | Admitting: Urology

## 2024-12-08 NOTE — Progress Notes (Signed)
 Spoke w/ via phone for pre-op interview--- pt Lab needs dos----  bmp/ A1c        Lab results------ current EKG in epic/ EKG  COVID test -----patient states asymptomatic no test needed Arrive at ------- 1145 on 12-10-2023 NPO after MN NO Solid Food.  Clear liquids from MN until--- 1045 Pre-Surgery Ensure or G2: n/a  Med rec completed Medications to take morning of surgery ----- ranexa / ativan / lipitor / diltiazem / nexium/ anoro ellipta inhaler/ asked to bring rescue inhaler/ if needed take norco/ nitro Diabetic medication ----- n/a  GLP1 agonist last dose: n/a GLP1 instructions:  Patient instructed no nail polish to be worn day of surgery Patient instructed to bring photo id and insurance card day of surgery Patient aware to have Driver (ride ) / caregiver    for 24 hours after surgery - wife, janet Patient Special Instructions ----- will take dial soap shower night before surgery Pre-Op special Instructions -----  pt has telephone cardiac clearance Barnie Hila NP dated 11-25-2024 in epic/ chart.  Pt dose plavix  12-02-2024 Pt stated can not lay flat due to back pain , T4 fracture.  Patient verbalized understanding of instructions that were given at this phone interview. Patient denies chest pain, sob, fever, cough at the interview.    Anesthesia Review:  CAD , 1996 s/p balloon angioplasty to diag./ PCI DES to LAD w/ overlapping DES to RCA , total x3, last cath 09/ 2025 stable CAD & patent stents;  HTN;  Chronic chest pain / vasospasms; Emphysema w/ SOB and lung cancer dx 2023 s/p chemoradiation 2023;  DM 2 diet controlled;  chronic pain syndrome, recently dx T4 fracture but has had this pain since a fall 05/ 2025 scheduled for surgery in few weeks.  Pt stated unable to lay flat due to back pain. Pt stated last taken nitroglycerin  3-4 days ago x1 tablet, rated it a 9 (0-10 scale).  Pt states had chest pain / spasms daily even though he takes ranexa  daily and tries not to take a  nitroglycerin  until pain gets to a 9.  PCP: Dr MICAEL Lesches (lov 11/ 2025 care [facility]) Cardiologist : Dr Court [facility] 09-12-2024) Oncologist:  Dr CHRISTELLA. Mohamed (lov 11-14-2024  Chest x-ray : CT 11-10-2024 EKG : 07-15-2024 Echo : 12-24-2021 Stress test: 03-25-2024  (normal , LR) Cardiac Cath :  last one 07-15-2024  Activity level:   pt unable to do regular activity due to pain Sleep Study/ CPAP :  no Fasting Blood Sugar : / Checks Blood Sugar -- times a day:  does not check  Blood Thinner/ Instructions Cherre Dose:  Plavix  ASA / Instructions/ Last Dose :  ASA Per Dr Carolee office in special needs pt to continue ASA.  Pt has cardiac clearance can stop plavix  5 days prior to surgery.  Pt verbalized understanding to continue ASA but not take morning of surgery and had stopped plavix  , last dose 12-02-2024.

## 2024-12-09 ENCOUNTER — Ambulatory Visit (HOSPITAL_COMMUNITY)
Admission: RE | Admit: 2024-12-09 | Discharge: 2024-12-09 | Disposition: A | Source: Home / Self Care | Attending: Urology | Admitting: Urology

## 2024-12-09 ENCOUNTER — Ambulatory Visit (HOSPITAL_COMMUNITY): Admitting: Registered Nurse

## 2024-12-09 ENCOUNTER — Encounter (HOSPITAL_COMMUNITY)
Admission: RE | Disposition: A | Discharge status: Home / Self Care | Payer: Self-pay | Source: Home / Self Care | Attending: Urology

## 2024-12-09 ENCOUNTER — Ambulatory Visit (HOSPITAL_COMMUNITY)

## 2024-12-09 ENCOUNTER — Encounter (HOSPITAL_COMMUNITY): Payer: Self-pay | Admitting: Urology

## 2024-12-09 DIAGNOSIS — Z01818 Encounter for other preprocedural examination: Secondary | ICD-10-CM

## 2024-12-09 HISTORY — DX: Chronic pain syndrome: G89.4

## 2024-12-09 HISTORY — DX: Mixed hyperlipidemia: E78.2

## 2024-12-09 HISTORY — DX: Type 2 diabetes mellitus without complications: E11.9

## 2024-12-09 HISTORY — DX: Other intervertebral disc degeneration, lumbar region without mention of lumbar back pain or lower extremity pain: M51.369

## 2024-12-09 HISTORY — DX: Anxiety disorder, unspecified: F41.9

## 2024-12-09 HISTORY — DX: Calculus of kidney: N20.0

## 2024-12-09 HISTORY — DX: Migraine, unspecified, not intractable, without status migrainosus: G43.909

## 2024-12-09 HISTORY — DX: Localized edema: R60.0

## 2024-12-09 LAB — BASIC METABOLIC PANEL WITH GFR
Anion gap: 10 (ref 5–15)
BUN: 9 mg/dL (ref 8–23)
CO2: 25 mmol/L (ref 22–32)
Calcium: 9 mg/dL (ref 8.9–10.3)
Chloride: 101 mmol/L (ref 98–111)
Creatinine, Ser: 0.82 mg/dL (ref 0.61–1.24)
GFR, Estimated: >60 mL/min
Glucose, Bld: 106 mg/dL — ABNORMAL HIGH (ref 70–99)
Potassium: 3.8 mmol/L (ref 3.5–5.1)
Sodium: 136 mmol/L (ref 135–145)

## 2024-12-09 LAB — HEMOGLOBIN A1C
Hgb A1c MFr Bld: 6.2 % — ABNORMAL HIGH (ref 4.8–5.6)
Mean Plasma Glucose: 131.24 mg/dL

## 2024-12-09 LAB — GLUCOSE, CAPILLARY
Glucose-Capillary: 99 mg/dL (ref 70–99)
Glucose-Capillary: 117 mg/dL — ABNORMAL HIGH (ref 70–99)

## 2024-12-09 MED ORDER — PHENYLEPHRINE 80 MCG/ML (10ML) SYRINGE FOR IV PUSH (FOR BLOOD PRESSURE SUPPORT)
PREFILLED_SYRINGE | INTRAVENOUS | Status: DC | PRN
  Administered 2024-12-09 (×4): 80 ug via INTRAVENOUS

## 2024-12-09 MED ORDER — EPHEDRINE SULFATE-NACL 50-0.9 MG/10ML-% IV SOSY
PREFILLED_SYRINGE | INTRAVENOUS | Status: DC | PRN
  Administered 2024-12-09 (×3): 5 mg via INTRAVENOUS

## 2024-12-09 MED ORDER — ORAL CARE MOUTH RINSE: 15.0000 mL | Freq: Once | OROMUCOSAL | Status: AC

## 2024-12-09 MED ORDER — ACETAMINOPHEN 10 MG/ML IV SOLN
INTRAVENOUS | Status: AC
  Filled 2024-12-09: qty 100

## 2024-12-09 MED ORDER — IOHEXOL 300 MG/ML  SOLN
INTRAMUSCULAR | Status: DC | PRN
  Administered 2024-12-09: 8 mL

## 2024-12-09 MED ORDER — PROPOFOL 10 MG/ML IV BOLUS
INTRAVENOUS | Status: AC
  Filled 2024-12-09: qty 20

## 2024-12-09 MED ORDER — PROPOFOL 10 MG/ML IV BOLUS
INTRAVENOUS | Status: DC | PRN
  Administered 2024-12-09: 130 mg via INTRAVENOUS

## 2024-12-09 MED ORDER — DEXMEDETOMIDINE HCL IN NACL 200 MCG/50ML IV SOLN
INTRAVENOUS | Status: DC | PRN
  Administered 2024-12-09 (×2): 4 ug via INTRAVENOUS

## 2024-12-09 MED ORDER — CHLORHEXIDINE GLUCONATE 0.12 % MT SOLN
OROMUCOSAL | Status: AC
  Administered 2024-12-09: 15 mL via OROMUCOSAL
  Filled 2024-12-09: qty 15

## 2024-12-09 MED ORDER — ACETAMINOPHEN 10 MG/ML IV SOLN
1000.0000 mg | Freq: Once | INTRAVENOUS | Status: DC | PRN
  Administered 2024-12-09: 1000 mg via INTRAVENOUS

## 2024-12-09 MED ORDER — IPRATROPIUM-ALBUTEROL 0.5-2.5 (3) MG/3ML IN SOLN
RESPIRATORY_TRACT | Status: AC
  Filled 2024-12-09: qty 3

## 2024-12-09 MED ORDER — CEFAZOLIN SODIUM-DEXTROSE 2-4 GM/100ML-% IV SOLN
2.0000 g | INTRAVENOUS | Status: AC
  Administered 2024-12-09: 2 g via INTRAVENOUS

## 2024-12-09 MED ORDER — OXYCODONE HCL 5 MG/5ML PO SOLN: 5.0000 mg | Freq: Once | ORAL | Status: DC | PRN

## 2024-12-09 MED ORDER — LIDOCAINE 2% (20 MG/ML) 5 ML SYRINGE
INTRAMUSCULAR | Status: DC | PRN
  Administered 2024-12-09: 100 mg via INTRAVENOUS

## 2024-12-09 MED ORDER — CHLORHEXIDINE GLUCONATE 0.12 % MT SOLN: 15.0000 mL | Freq: Once | OROMUCOSAL | Status: AC

## 2024-12-09 MED ORDER — FENTANYL CITRATE (PF) 100 MCG/2ML IJ SOLN
INTRAMUSCULAR | Status: AC
  Filled 2024-12-09: qty 2

## 2024-12-09 MED ORDER — IPRATROPIUM-ALBUTEROL 0.5-2.5 (3) MG/3ML IN SOLN
3.0000 mL | Freq: Once | RESPIRATORY_TRACT | Status: AC
  Administered 2024-12-09: 3 mL via RESPIRATORY_TRACT

## 2024-12-09 MED ORDER — HYDROMORPHONE HCL 1 MG/ML IJ SOLN: 0.5000 mg | INTRAMUSCULAR | Status: DC | PRN

## 2024-12-09 MED ORDER — SODIUM CHLORIDE 0.9 % IR SOLN
Status: DC | PRN
  Administered 2024-12-09: 3000 mL

## 2024-12-09 MED ORDER — CEFAZOLIN SODIUM-DEXTROSE 2-4 GM/100ML-% IV SOLN
INTRAVENOUS | Status: AC
  Filled 2024-12-09: qty 100

## 2024-12-09 MED ORDER — INSULIN ASPART 100 UNIT/ML IJ SOLN: 0.0000 [IU] | INTRAMUSCULAR | Status: DC | PRN

## 2024-12-09 MED ORDER — OXYCODONE HCL 5 MG PO TABS: 5.0000 mg | ORAL_TABLET | Freq: Once | ORAL | Status: DC | PRN

## 2024-12-09 MED ORDER — DROPERIDOL 2.5 MG/ML IJ SOLN: 0.6250 mg | Freq: Once | INTRAMUSCULAR | Status: DC | PRN

## 2024-12-09 MED ORDER — LACTATED RINGERS IV SOLN: INTRAVENOUS | Status: DC

## 2024-12-09 MED ORDER — ONDANSETRON HCL 4 MG/2ML IJ SOLN
INTRAMUSCULAR | Status: DC | PRN
  Administered 2024-12-09: 4 mg via INTRAVENOUS

## 2024-12-09 NOTE — Anesthesia Procedure Notes (Signed)
 Procedure Name: LMA Insertion Date/Time: 12/09/2024 1:13 PM  Performed by: Elly Pfeiffer, CRNAPre-anesthesia Checklist: Patient identified, Emergency Drugs available, Suction available, Patient being monitored and Timeout performed Patient Re-evaluated:Patient Re-evaluated prior to induction Oxygen Delivery Method: Circle system utilized Preoxygenation: Pre-oxygenation with 100% oxygen Induction Type: IV induction LMA Size: 5.0 Number of attempts: 1 Airway Equipment and Method: Patient positioned with wedge pillow Dental Injury: Teeth and Oropharynx as per pre-operative assessment  Comments: Patient positioned for comfort and states comfort prior to smooth IV induction and LMA placement. CA

## 2024-12-09 NOTE — Discharge Instructions (Signed)

## 2024-12-09 NOTE — Interval H&P Note (Signed)
 History and Physical Interval Note:  12/09/2024 12:57 PM  Francisco Austin  has presented today for surgery, with the diagnosis of RIGHT KIDNEY STONE.  The various methods of treatment have been discussed with the patient and family. After consideration of risks, benefits and other options for treatment, the patient has consented to  Procedures: CYSTOSCOPY/URETEROSCOPY/HOLMIUM LASER/STENT PLACEMENT (Right) as a surgical intervention.  The patient's history has been reviewed, patient examined, no change in status, stable for surgery.  I have reviewed the patient's chart and labs.  Questions were answered to the patient's satisfaction.     Francisco Austin, Francisco Austin

## 2024-12-09 NOTE — Transfer of Care (Signed)
 Immediate Anesthesia Transfer of Care Note  Patient: Francisco Austin  Procedure(s) Performed: CYSTOSCOPY/URETEROSCOPY/HOLMIUM LASER/STENT PLACEMENT (Right: Ureter)  Patient Location: PACU  Anesthesia Type:General  Level of Consciousness: drowsy and patient cooperative  Airway & Oxygen Therapy: Patient Spontanous Breathing and Patient connected to face mask oxygen  Post-op Assessment: Report given to RN and Post -op Vital signs reviewed and stable  Post vital signs: stable  Last Vitals:  Vitals Value Taken Time  BP    Temp    Pulse 79 12/09/24 14:44  Resp    SpO2 95 % 12/09/24 14:44  Vitals shown include unfiled device data.  Last Pain:  Vitals:   12/09/24 1056  TempSrc: Oral  PainSc: 9       Patients Stated Pain Goal: 5 (12/09/24 1056)  Complications: No notable events documented.

## 2024-12-09 NOTE — Progress Notes (Signed)
 Spoke with pt regarding early arrival time of 1030. Pt and spouse agreeable. Reminded pt to be NPO. No questions at this time

## 2024-12-09 NOTE — Anesthesia Preprocedure Evaluation (Addendum)
 "                                  Anesthesia Evaluation  Patient identified by MRN, date of birth, ID band Patient awake    Reviewed: Allergy & Precautions, H&P , NPO status , Patient's Chart, lab work & pertinent test results  History of Anesthesia Complications Negative for: history of anesthetic complications  Airway Mallampati: II  TM Distance: <3 FB Neck ROM: Full    Dental  (+) Dental Advisory Given, Edentulous Upper, Edentulous Lower   Pulmonary neg sleep apnea, COPD,  COPD inhaler, Current SmokerPatient did not abstain from smoking.   Pulmonary exam normal breath sounds clear to auscultation       Cardiovascular Exercise Tolerance: Good METShypertension, Pt. on medications + CAD and + Cardiac Stents  (-) Past MI (-) dysrhythmias  Rhythm:Regular Rate:Normal  Cardiology optimization note: Chart reviewed as part of pre-operative protocol coverage. According to the RCRI, patient has a 6.6% risk of MACE. Patient reports activity equivalent to 4.4 METS (per DASI).    Given past medical history and time since last visit, based on ACC/AHA guidelines, Quintavious E Helton would be at a moderate but acceptable risk for the planned procedure without further cardiovascular testing.      Nuclear stress test 03/25/24:   The study is normal. The study is low risk.   No ST deviation was noted.   LV perfusion is normal.   Left ventricular function is normal. Nuclear stress EF: 74%. The left ventricular ejection fraction is hyperdynamic (>65%). End diastolic cavity size is normal. End systolic cavity size is normal.   CT images were obtained for attenuation correction and were examined for the presence of coronary calcium  when appropriate.   Coronary calcium  assessment not performed due to prior revascularization.   Prior study not available for comparison.   Electronically signed by Jerel Balding, MD   Low risk stress nuclear study with normal perfusion and normal  left ventricular regional and global systolic function.    Neuro/Psych  Headaches PSYCHIATRIC DISORDERS Anxiety        GI/Hepatic Neg liver ROS,GERD  Medicated,,  Endo/Other  diabetes, Well Controlled    Renal/GU negative Renal ROS  negative genitourinary   Musculoskeletal  (+) Arthritis , Osteoarthritis,    Abdominal   Peds  Hematology negative hematology ROS (+)   Anesthesia Other Findings Past Medical History: No date: Agent orange exposure No date: Anxiety disorder No date: Arthritis 1984: CAD (coronary artery disease)     Comment:  cardiologist--- Dr Court;   1994--0 C.C. balloon angio               to diagonal for severe dz;  02/ 2023  PCI w/ DES to LAD &              overlapping DES to mRCA; 07/2024 C.C. patent stents &               stable CAD; NUC 03-25-2024 No date: Chronic chest pain No date: Chronic pain syndrome     Comment:  thoracic and right side chest wall pain radiated to back              under scapula and chronic low back pain No date: COPD (chronic obstructive pulmonary disease) (HCC) No date: Coronary vasospasm No date: DDD (degenerative disc disease), lumbar No date: Diet-controlled type 2 diabetes mellitus (HCC)  Comment:  followed by pcp;   (12-09-2023  pt stated does not check              blood sugar) No date: Edema of both lower extremities     Comment:  pt uses compression hose 06/2022: History of cancer chemotherapy     Comment:  systemic chemo completed 06-09-2022 for small cell lung               cancer 09/2022: History of external beam radiation therapy     Comment:  SBRT to RUL and prophylactic cranial irradiatic ,  Dr               Patrcia No date: History of kidney stones No date: HLD (hyperlipidemia) No date: HTN (hypertension) No date: Hyperlipidemia, mixed 2023: Lymphadenopathy, hilar     Comment:  left  assos. w/ lung cancer 2023: Mediastinal lymphadenopathy     Comment:  assos w/ lung cancer No date: Migraines No  date: Renal calculus, right 03/2022: Small cell lung cancer, left upper lobe [facility])     Comment:  oncologist--- Dr CHRISTELLA. Mohamed;   mixed SCC;  completed               chemo 06-09-2022 concurrent radiation completed               09-03-2022 to right URL and prophylactic cranial               irradiation No date: Squamous cell carcinoma of lung (HCC)     Comment:  LUL  Reproductive/Obstetrics negative OB ROS                              Anesthesia Physical Anesthesia Plan  ASA: 3  Anesthesia Plan: General   Post-op Pain Management: Tylenol  PO (pre-op)*   Induction: Intravenous  PONV Risk Score and Plan: 4 or greater and Ondansetron , Dexamethasone, Propofol  infusion, TIVA and Treatment may vary due to age or medical condition  Airway Management Planned: LMA  Additional Equipment: None  Intra-op Plan:   Post-operative Plan: Extubation in OR  Informed Consent: I have reviewed the patients History and Physical, chart, labs and discussed the procedure including the risks, benefits and alternatives for the proposed anesthesia with the patient or authorized representative who has indicated his/her understanding and acceptance.     Dental advisory given  Plan Discussed with: CRNA  Anesthesia Plan Comments: (Discussed risks of anesthesia with patient, including PONV, sore throat, lip/dental/eye damage. Rare risks discussed as well, such as cardiorespiratory and neurological sequelae, and allergic reactions. Discussed the role of CRNA in patient's perioperative care. Patient understands. Patient counseled on benefits of smoking cessation, and increased perioperative risks associated with continued smoking. )        Anesthesia Quick Evaluation  "

## 2024-12-09 NOTE — Op Note (Signed)
 Operative Note  Preoperative diagnosis:  1.  Right renal calculi and ureteral calculi  Postoperative diagnosis: 1.  Same  Procedure(s): 1.  Cystoscopy with right retrograde pyelogram, right ureteroscopy laser lithotripsy and stone basketing, ureteral stent placement  Surgeon: Sherwood Edison, MD  Assistants: None  Anesthesia: General  Complications: None immediate  EBL: Minimal  Specimens: 1.  Renal calculi  Drains/Catheters: 1.  6 x 26 double-J ureteral stent  Intraoperative findings: 1.  Normal urethra and bladder 2.  Right ureteroscopy revealed a distal ureteral calculus that was basket extracted.  He had numerous renal calculi that were fragmented and basket extracted. 3.  Right retrograde pyelogram showed some fullness in the renal pelvis but no hydronephrosis.  No filling defect in the kidney.  Indication: 74 year old male with a renal and ureteral calculus presents for the previously mentioned operation.  Description of procedure:  The patient was identified and consent was obtained.  The patient was taken to the operating room and placed in the supine position.  The patient was placed under general anesthesia.  Perioperative antibiotics were administered.  The patient was placed in dorsal lithotomy.  Patient was prepped and draped in a standard sterile fashion and a timeout was performed.  A 21 French rigid cystoscope was Gaile into the urethra and into the bladder.  Complete cystoscopy was performed with findings noted above.  Right ureter was cannulated with a sensor wire which was advanced up to the kidney under fluoroscopic guidance.  Second wire was advanced alongside the stent up into the kidney.  11 x 13 ureteral access sheath was advanced over one of the wires under continuous fluoroscopic guidance to the proximal ureter.  Inner sheath and wire were withdrawn.  Digital ureteroscopy was performed and larger stones were fragmented to smaller fragments.  Clinically  significant stone fragments were basket extracted which were numerous.  After all stones were extracted, I shot a retrograde pyelogram through the scope with the findings noted above.  I then withdrew the scope visualizing the ureter upon removal.  There were no ureteral injury seen but I did see a ureteral calculus in the distal ureter.  I therefore advanced the semirigid ureteroscope up to it and basketed the stone out.  I then backloaded the wire onto rigid cystoscope and advanced that into the bladder followed by routine placement of a 6 x 26 double-J ureteral stent.  Fluoroscopy confirmed proximal placement and direct visualization confirmed a good coil in the bladder.  I drained the bladder and withdrew the scope.  Patient tolerated the procedure well was stable postoperatively.  Plan: Follow-up in 1 week for stent removal

## 2024-12-09 NOTE — Anesthesia Postprocedure Evaluation (Signed)
"   Anesthesia Post Note  Patient: Francisco Austin  Procedure(s) Performed: CYSTOSCOPY/URETEROSCOPY/HOLMIUM LASER/STENT PLACEMENT (Right: Ureter)     Patient location during evaluation: PACU Anesthesia Type: General Level of consciousness: awake and alert Pain management: pain level controlled Vital Signs Assessment: post-procedure vital signs reviewed and stable Respiratory status: spontaneous breathing, nonlabored ventilation, respiratory function stable and patient connected to nasal cannula oxygen Cardiovascular status: blood pressure returned to baseline and stable Postop Assessment: no apparent nausea or vomiting Anesthetic complications: no   No notable events documented.  Last Vitals:  Vitals:   12/09/24 1445 12/09/24 1500  BP: 107/67 96/68  Pulse: 77 75  Resp: 13 13  Temp: (!) 36.3 C   SpO2: 95% 92%    Last Pain:  Vitals:   12/09/24 1500  TempSrc:   PainSc: 0-No pain                 Francisco Austin      "

## 2025-03-06 ENCOUNTER — Inpatient Hospital Stay: Attending: Internal Medicine

## 2025-03-14 ENCOUNTER — Inpatient Hospital Stay: Admitting: Internal Medicine
